# Patient Record
Sex: Female | Born: 1945
Health system: Southern US, Community
[De-identification: ages and names within clinical notes are randomized; demographics above are authoritative.]

## PROBLEM LIST (undated history)

## (undated) DIAGNOSIS — I10 Essential (primary) hypertension: Secondary | ICD-10-CM

## (undated) DIAGNOSIS — E785 Hyperlipidemia, unspecified: Secondary | ICD-10-CM

## (undated) HISTORY — DX: Essential (primary) hypertension: I10

## (undated) HISTORY — PX: REPLACEMENT TOTAL KNEE: SUR1224

## (undated) HISTORY — DX: Hyperlipidemia, unspecified: E78.5

---

## 2003-05-12 ENCOUNTER — Encounter: Admission: RE | Admit: 2003-05-12 | Discharge: 2003-05-12 | Payer: Self-pay | Admitting: Internal Medicine

## 2003-11-15 ENCOUNTER — Ambulatory Visit (HOSPITAL_COMMUNITY): Admission: RE | Admit: 2003-11-15 | Discharge: 2003-11-15 | Payer: Self-pay | Admitting: Obstetrics & Gynecology

## 2004-06-27 ENCOUNTER — Ambulatory Visit (HOSPITAL_COMMUNITY): Admission: RE | Admit: 2004-06-27 | Discharge: 2004-06-27 | Payer: Self-pay | Admitting: Obstetrics & Gynecology

## 2004-10-13 ENCOUNTER — Encounter: Admission: RE | Admit: 2004-10-13 | Discharge: 2004-10-13 | Payer: Self-pay | Admitting: Internal Medicine

## 2005-03-18 ENCOUNTER — Emergency Department (HOSPITAL_COMMUNITY): Admission: EM | Admit: 2005-03-18 | Discharge: 2005-03-18 | Payer: Self-pay | Admitting: Emergency Medicine

## 2005-11-02 ENCOUNTER — Encounter: Admission: RE | Admit: 2005-11-02 | Discharge: 2005-11-02 | Payer: Self-pay | Admitting: Internal Medicine

## 2005-11-23 ENCOUNTER — Ambulatory Visit (HOSPITAL_COMMUNITY): Admission: RE | Admit: 2005-11-23 | Discharge: 2005-11-23 | Payer: Self-pay | Admitting: Obstetrics & Gynecology

## 2005-12-11 ENCOUNTER — Ambulatory Visit (HOSPITAL_COMMUNITY): Admission: RE | Admit: 2005-12-11 | Discharge: 2005-12-11 | Payer: Self-pay | Admitting: Obstetrics & Gynecology

## 2006-01-18 ENCOUNTER — Ambulatory Visit (HOSPITAL_COMMUNITY): Admission: RE | Admit: 2006-01-18 | Discharge: 2006-01-18 | Payer: Self-pay | Admitting: Obstetrics & Gynecology

## 2006-01-18 ENCOUNTER — Encounter (INDEPENDENT_AMBULATORY_CARE_PROVIDER_SITE_OTHER): Payer: Self-pay | Admitting: *Deleted

## 2006-08-13 ENCOUNTER — Ambulatory Visit (HOSPITAL_COMMUNITY): Admission: RE | Admit: 2006-08-13 | Discharge: 2006-08-13 | Payer: Self-pay | Admitting: Internal Medicine

## 2006-10-22 ENCOUNTER — Ambulatory Visit (HOSPITAL_COMMUNITY): Admission: RE | Admit: 2006-10-22 | Discharge: 2006-10-22 | Payer: Self-pay | Admitting: Internal Medicine

## 2006-10-29 ENCOUNTER — Inpatient Hospital Stay (HOSPITAL_COMMUNITY): Admission: RE | Admit: 2006-10-29 | Discharge: 2006-11-01 | Payer: Self-pay | Admitting: Orthopedic Surgery

## 2006-12-04 ENCOUNTER — Encounter: Admission: RE | Admit: 2006-12-04 | Discharge: 2007-01-17 | Payer: Self-pay | Admitting: Orthopedic Surgery

## 2007-10-24 ENCOUNTER — Ambulatory Visit (HOSPITAL_COMMUNITY): Admission: RE | Admit: 2007-10-24 | Discharge: 2007-10-24 | Payer: Self-pay | Admitting: Internal Medicine

## 2007-11-18 ENCOUNTER — Inpatient Hospital Stay (HOSPITAL_COMMUNITY): Admission: RE | Admit: 2007-11-18 | Discharge: 2007-11-20 | Payer: Self-pay | Admitting: Orthopedic Surgery

## 2008-10-25 ENCOUNTER — Ambulatory Visit (HOSPITAL_COMMUNITY): Admission: RE | Admit: 2008-10-25 | Discharge: 2008-10-25 | Payer: Self-pay | Admitting: Internal Medicine

## 2008-11-09 ENCOUNTER — Encounter: Admission: RE | Admit: 2008-11-09 | Discharge: 2008-12-13 | Payer: Self-pay | Admitting: Orthopedic Surgery

## 2009-01-18 IMAGING — CR DG CHEST 2V
2 series · 2 of 2 positions shown · non-contrast
Comparison: 10/24/2006

CLINICAL DATA: Knee osteoarthritis.  Preop evaluation for knee
replacement.

CHEST - 2 VIEW

[w chest pa]
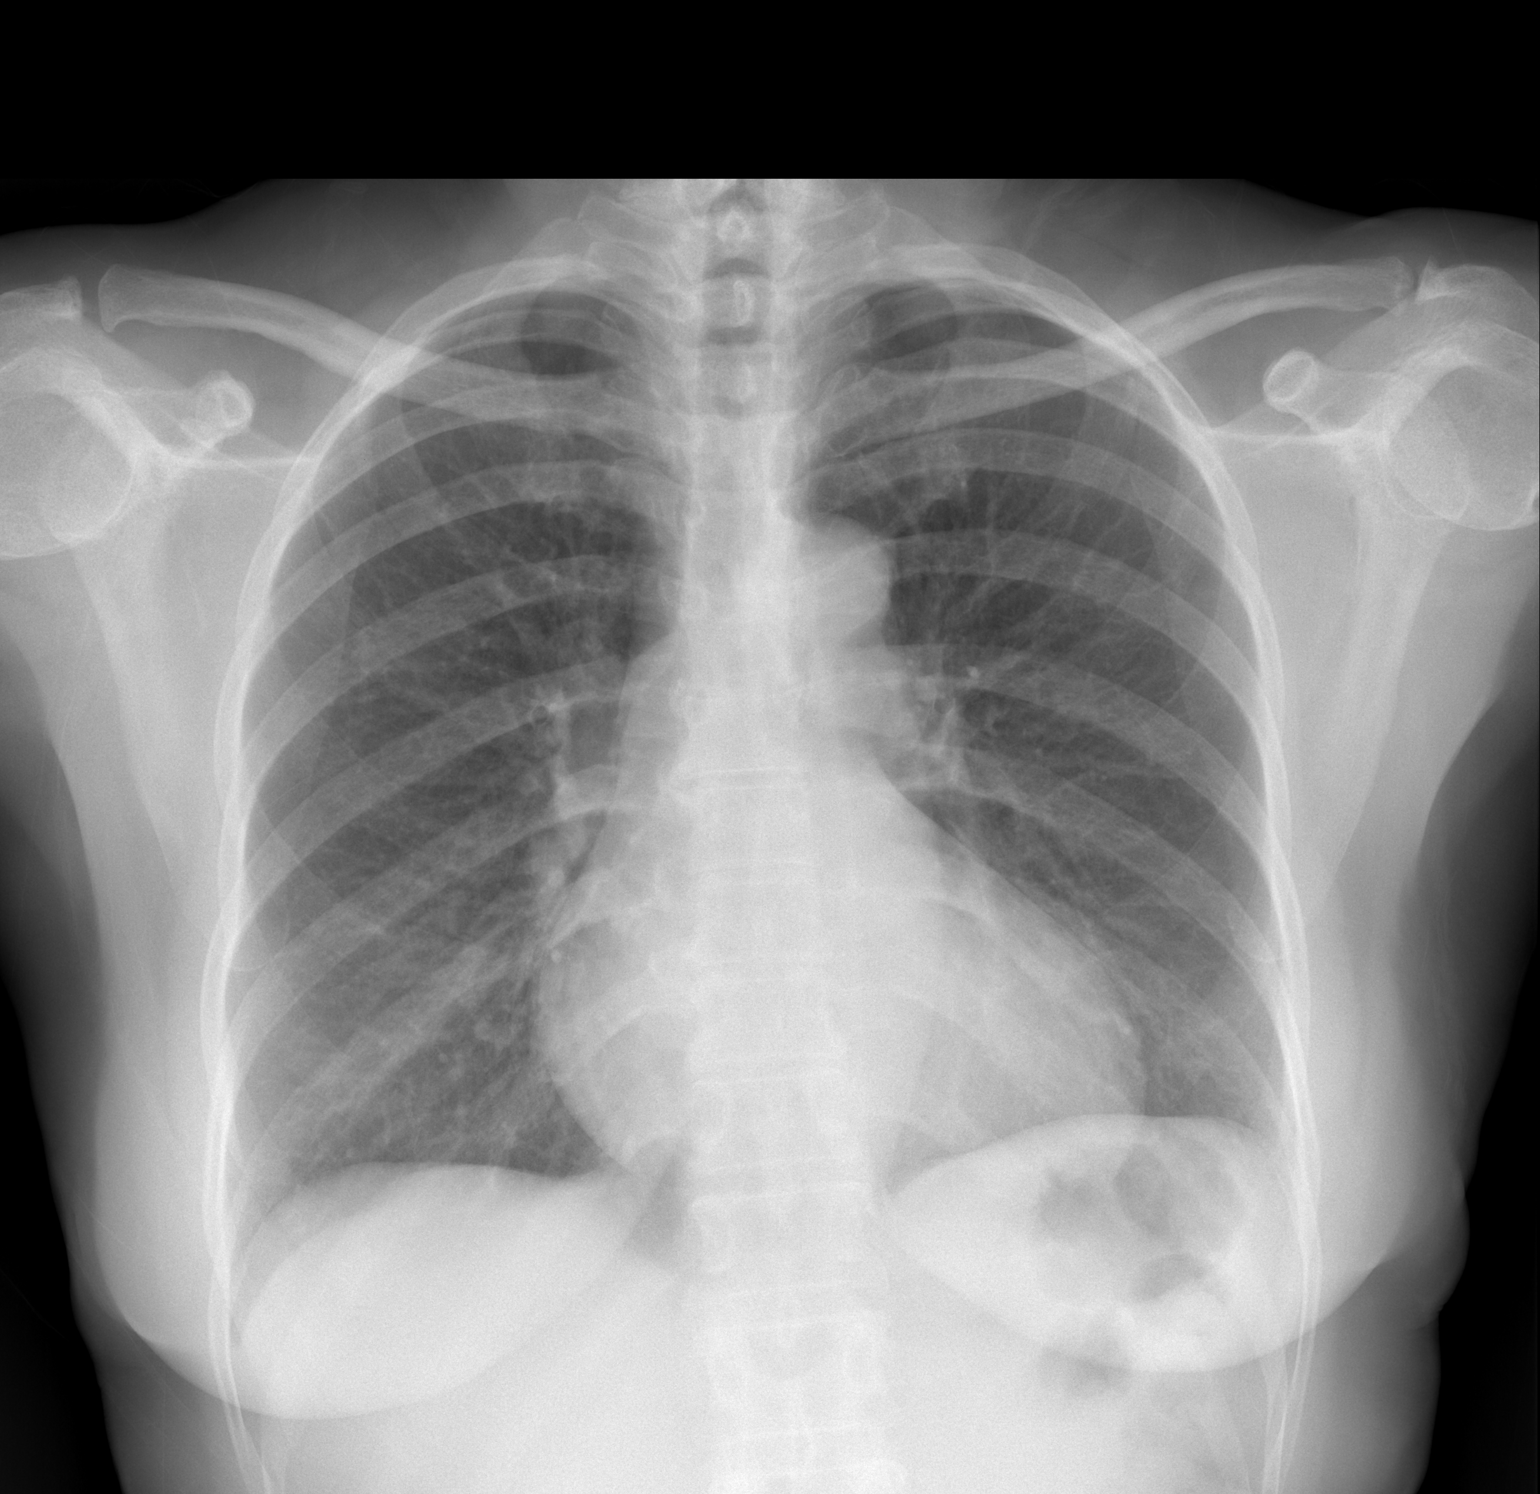

[w chest lat]
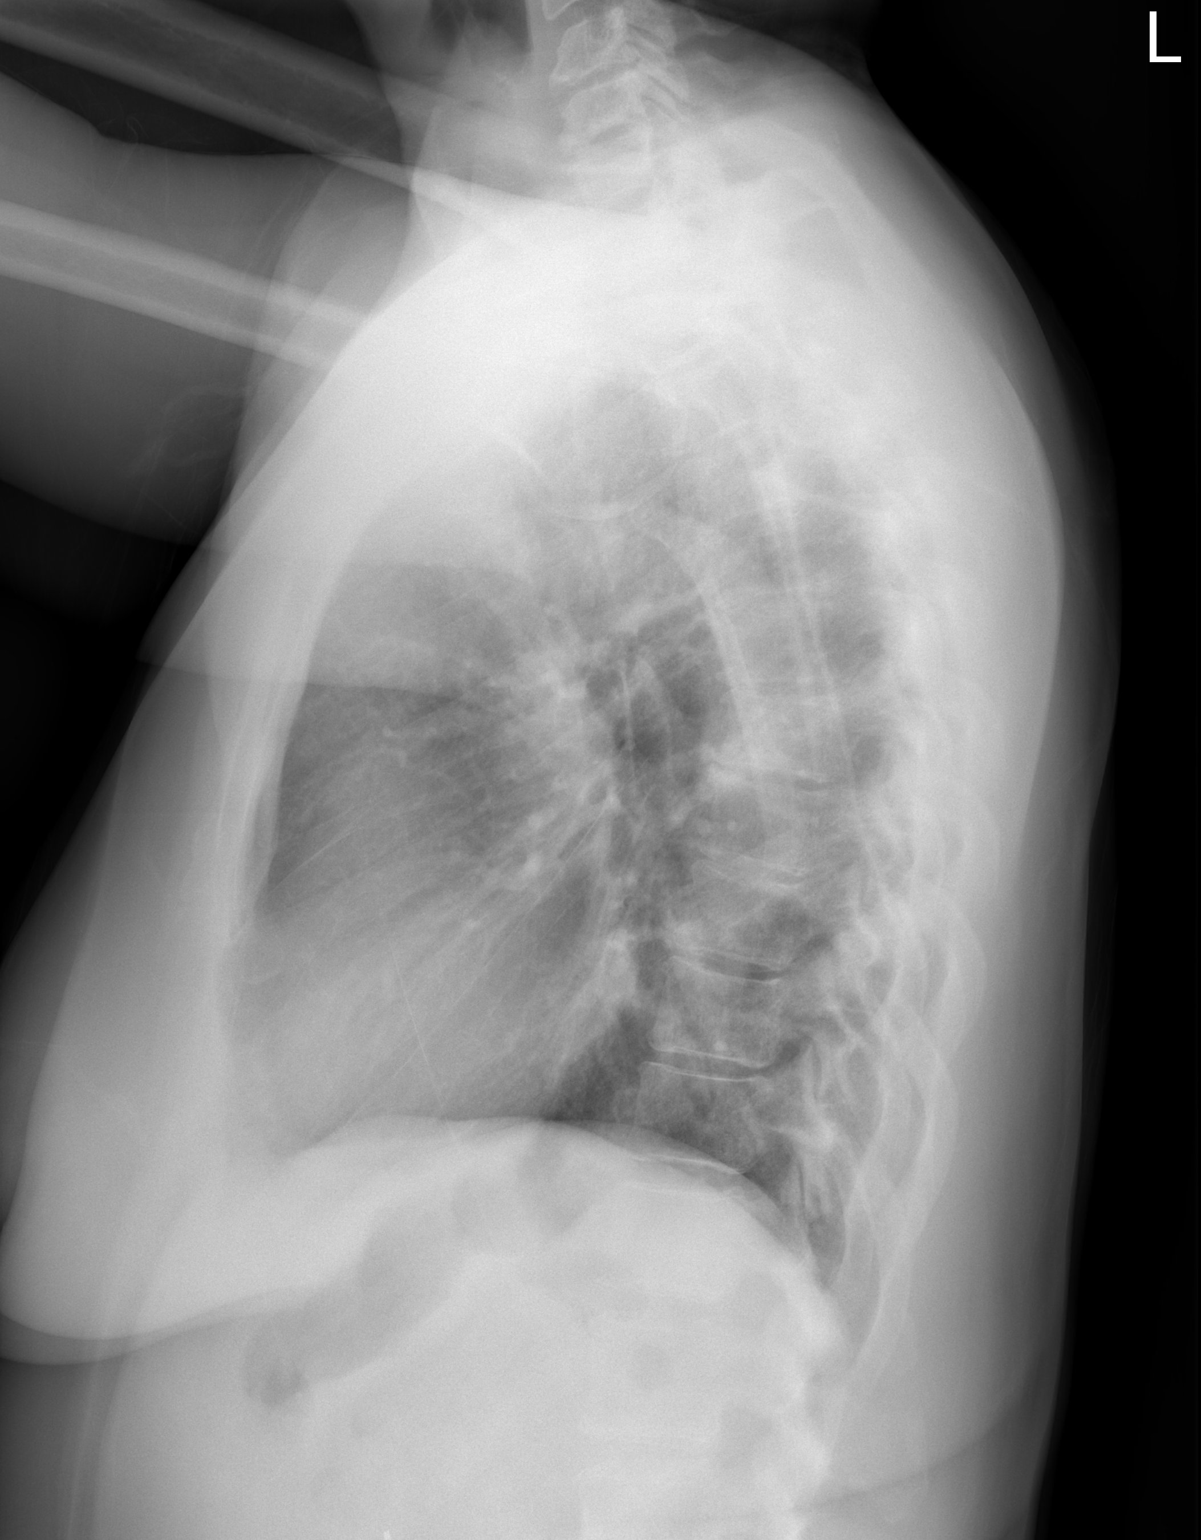

[2 of 2 positions shown; findings below may reference images not displayed]

FINDINGS: Mild cardiomegaly stable.  Both lungs are clear.  There
is no evidence of pleural effusion.  No mass or adenopathy
identified.
IMPRESSION: Stable mild cardiomegaly.  No active disease.

## 2009-07-11 DIAGNOSIS — I10 Essential (primary) hypertension: Secondary | ICD-10-CM | POA: Insufficient documentation

## 2009-07-11 HISTORY — DX: Essential (primary) hypertension: I10

## 2009-07-12 ENCOUNTER — Ambulatory Visit: Payer: Self-pay

## 2009-07-12 ENCOUNTER — Ambulatory Visit: Payer: Self-pay | Admitting: Cardiology

## 2009-11-22 ENCOUNTER — Ambulatory Visit (HOSPITAL_COMMUNITY): Admission: RE | Admit: 2009-11-22 | Discharge: 2009-11-22 | Payer: Self-pay | Admitting: Internal Medicine

## 2010-10-03 NOTE — H&P (Signed)
NAMEMarland [name]  BREALYNN, CONTINO NO.:  [id number]   MEDICAL RECORD NO.:  [id number]          PATIENT TYPE:   LOCATION:                                 FACILITY:   PHYSICIAN:  Marlowe Kays, M.D.  DATE OF BIRTH:  [birth year]   DATE OF ADMISSION:  10/29/2006  DATE OF DISCHARGE:                              HISTORY & PHYSICAL   CHIEF COMPLAINT:  Pain in my left knee.   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  She has documented osteoarthritis  by x-ray as well as examination.  We have tried conservative care  including corticosteroid injections into the left knee, physical therapy  and viscosupplementation.  Unfortunately, she has not improved much that  much where she can continue with her day-to-day activities and is quite  frustrated, now developing a limp.  After much discussion including the  risks and benefits of surgery, it was felt she would benefit from  surgical intervention and is admitted for total knee replacement  arthroplasty to the left knee.   PAST MEDICAL HISTORY:  This lady has been in relatively good health  throughout her lifetime.  Her only surgeries have been what she  describes as a cholelithotomy and a tubal ligation.  She also has  dyslipidemia and hypertension.   Dr. Concepcion Elk is her medical doctor and has cleared her for surgery.   CURRENT MEDICATIONS:  1. Zocor 20 mg one daily.  2. Nifedipine 60 mg one daily.  3. Calcium two tabs daily.  4. Flex-A-Min triple strength two daily.  5. Glucosamine chondroitin.   ALLERGIES:  She has no medical allergies.   ACTIVITY:  Positive for stomach cancer in her mother and coronary artery  disease in her father.   SOCIAL HISTORY:  She is single.  She is a Geophysicist/field seismologist.  She has  no intake of alcohol or tobacco products.  She has 3 children, and her  caregiver  after surgery will be her children.  She lives in a two-story  house with 13 stairs on the staircase.   REVIEW OF SYSTEMS:  CNS:  No seizures, shoulder paralysis, numbness or  double vision.  RESPIRATORY:  No productive cough, no hemoptysis, no  shortness of breath.  CARDIOVASCULAR:  No chest pain, no angina, no  orthopnea.  GASTROINTESTINAL:  No nausea, melena or bloody stools.  GENITOURINARY:  No discharge, dysuria, hematuria.  MUSCULOSKELETAL:  Primarily in present illness with her left knee.   PHYSICAL EXAMINATION:  GENERAL:  Alert, cooperative, friendly, slightly  anxious 65 year old black female who is oriented x3.  VITAL SIGNS:  Blood pressure 128/78 seated right arm, pulse 88 and  regular, respirations 12, unlabored.  HEENT:  Normocephalic.  Pupils are equal, round and reactive to light  and accommodation.  Oropharynx is clear.  CHEST:  Clear to auscultation.  No rhonchi, no rales.  HEART:  Regular rate and rhythm.  There is a grade 3/6 holosystolic  murmur heard best at the right sternal  border.  ABDOMEN:  Soft, nontender.  Liver and spleen not felt.  GENITALIA/RECTAL/PELVIC/BREASTS:  Not done.  Not pertinent to present  illness.  EXTREMITIES:  Left knee is with a crepitus on range of motion.  There is  a moderate amount of boggy synovitis and moderate swelling to the left  knee.   ADMITTING DIAGNOSIS:  1. End-stage osteoarthritis of the left knee.  2. Hypertension.  3. Dyslipidemia.   PLAN:  The patient will undergo left total knee replacement  arthroplasty.  She plans to have her children, specifically her  daughter, to care for her after surgery in her home with home health.  Again, today I described all the risks and benefits of surgery with this  patient including the possibility of infection, loosening, the need for  transfusion postoperatively.      [name].    ______________________________  Marlowe Kays, M.D.    DLU/MEDQ  D:   10/17/2006  T:  10/17/2006  Job:  [id number]   cc:   Fleet Contras, M.D.  Fax: [phone]

## 2010-10-03 NOTE — Discharge Summary (Signed)
NAMEMarland Kitchen  [name], [name] NO.:  [id number]   MEDICAL RECORD NO.:  [id number]          PATIENT TYPE:  INP   LOCATION:  1603                         FACILITY:  [facility]   PHYSICIAN:  Marlowe Kays, M.D.  DATE OF BIRTH:  12-11-1945   DATE OF ADMISSION:  11/18/2007  DATE OF DISCHARGE:  11/20/2007                               DISCHARGE SUMMARY   ADMITTING DIAGNOSES:  1. Adhesions, left total knee.  2. Hypertension.  3. Hyperlipidemia.  4. Hypokalemia.   DISCHARGE DIAGNOSIS:  1. Adhesions, left total knee.  2. Hypertension.  3. Hyperlipidemia.  4. Hypokalemia.   OPERATION:  On November 18, 2007 the patient underwent an arthrotomy with  excision of adhesions and scar tissue of left total knee.  Also  underwent freeing up  patellar mechanism, with a complete lateral  patellar release.  [name] assisted.   BRIEF HISTORY:  This 65 year old black female underwent total knee  replacement arthroplasty in June of last year.  She did fairly well with  surgery, but had less and less range of motion of the knee.  She had CPM  in the hospital on therapy, and then outpatient physical therapy, but  unfortunately she was essentially limited to 50 degrees of flexion.  This was quite frustrating to her, because of her level of activity that  she enjoyed prior to total knee.  After much discussion including the  risks, benefits of surgery, it was decided she would benefit from  surgical intervention and was scheduled for the above procedure.   Preoperatively she was noted to have hypokalemia on preoperative  laboratory findings.   LABORATORY FINDINGS:  Dr. Concepcion Elk gave her potassium b.i.d. for about 5  days, and this brought it up to very acceptable for her surgical  procedure.   COURSE IN HOSPITAL:  The patient tolerated the surgical procedure quite  well, maintaining a very positive outlook concerning range of motion.  She worked diligently with physical therapy.  We were  able to achieve  100-110 degrees of flexion in the operating room and she was told this,  and this would be her goal postoperatively.  On the day of discharge,  she was comfortable, had been ambulating in the hall, tolerating the CPM  machine, as well as range of motion quite well.  P.O. meds were  controlling her discomfort.  She did have some hypotensive episodes.  She has been on nifedipine from Dr. Concepcion Elk of her hypertension.  It is  felt this combined with her minimum blood loss (hemoglobin of 10.7),  might have caused  hypertensive episodes.  This could also be from the  medications.  We therefore stopped her nifedipine, so that she would not  have any syncopal episodes at home, and perhaps fall.  She is instructed  to follow-up with Dr. Concepcion Elk concerning that.   LABORATORY VALUES:  In the hospital showed a final hemoglobin of 10.7,  hematocrit was 31.7.  White count was 7.5.  She did drop her potassium  to 3.3 and we started her on some p.o. potassium while in the hospital  and final her chemistry  showed a sodium of 140, potassium 3.8.  Electrocardiogram showed normal sinus rhythm.  Chest x-Ray was  essentially negative.   CONDITION ON DISCHARGE:  Improved, stable.   PLAN:  The patient is discharged to home in the care of her family,  weightbear as tolerated.  Continue with physical therapy.  We need to  have her try to maintain the flexion that we had achieved while in her  the operating room.  She is given Vicodin for discomfort and Robaxin as  a muscle relaxant.  Again, I told to follow up with Dr. Concepcion Elk, as a  matter-of-fact, to call him today, telling him about the hypotensive  episodes, that we had stopped her blood pressure medication.  We will  not continue her on the potassium either.   The patient was placed on Lovenox postoperatively.  This was stopped.  The last dose is given on the day of discharge, and she will continue  with her one 81 mg aspirin at home.   She will return to see [name] 2 weeks  after the date of surgery and she is urged to call if she has any  problems or questions.  Neurovascular is intact in the operative  extremity at time of discharge.  The patient is very cheerful and  anxious to go home.  All questions were encouraged and answered at  bedside, prior to her discharge.      [name].    ______________________________  Marlowe Kays, M.D.    DLU/MEDQ  D:  11/20/2007  T:  11/20/2007  Job:  [id number]   cc:   Fleet Contras, M.D.  Fax: [phone]

## 2010-10-03 NOTE — H&P (Signed)
NAME:  [name], [name] NO.:  [id number]   MEDICAL RECORD NO.:  [id number]          PATIENT TYPE:  INP   LOCATION:  NA                           FACILITY:  [facility]   PHYSICIAN:  [name], M.D.  DATE OF BIRTH:  April 01, 1946   DATE OF ADMISSION:  10/18/2007  DATE OF DISCHARGE:                              HISTORY & PHYSICAL   CHIEF COMPLAINT:  Problems with my left knee.   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  She did  fairly well after surgery but began having less and less range of motion  of her left knee.  She had CPM  in the hospital, home therapy after her  surgery and then outpatient physical therapy.  She increases her range  of motion somewhat after therapy but it returns to about 50 degrees  flexion.  She has nearly to full extension.  She is quite frustrated  with this.  The knee  is not allowing her to enjoy her day-to-day  activities due to the lack of flexion.  After much discussion including  the risks and benefits of surgery decided to go ahead with left knee  arthroscopy to hopefully be able to lyse all the  adhesions and provide  her with more flexion but we may have to go to downsizing the poly joint  spacer in the knee.  All this was explained to her today and all  questions were encouraged and answered.   PAST MEDICAL HISTORY:  [name] has been in relatively good health under the care of [name].   CURRENT MEDICATIONS:  1. Nifedipine 60 mg one a day.  2. Simvastatin 40 mg one a day.  3. One-A-Day vitamin.  4. Glucosamine chondroitin 2 daily.  5. One baby aspirin daily.  Will stop prior to surgery.  6. [place] ginseng  daily which she will also stop.   It was noted her potassium was 2.9 in her preop labs and [name] [name]  her on potassium 20 mEq one b.i.d.  Has a history of hypertension,  hyperlipidemia.   FAMILY HISTORY:  Positive for congestive heart failure in the father and cancer in the  mother.   SOCIAL HISTORY:  She is a single Geologist, engineering.  Her family will take care of her  after the surgery with home health if needed.   ALLERGIES:  No known drug allergies.   REVIEW OF SYSTEMS:  CNS:  No seizures or paralysis, numbness, double vision.  RESPIRATORY:  No productive cough, no hemoptysis, no shortness of breath.  CARDIOVASCULAR:  No chest pain, no angina, no orthopnea.  GASTROINTESTINAL:  No nausea, vomiting, melena or bloody stools.  GENITOURINARY:  No discharge, dysuria, hematuria.  MUSCULOSKELETAL:  Primarily in present illness with her left knee.   PHYSICAL EXAMINATION:  Alert and cooperative, friendly, 5 feet 4, 166 pound black female whose  vital signs are blood pressure 120/72 seated left arm, respirations 12,  pulse 76.  HEENT:  Normocephalic PERRLA.  Oropharynx is clear.  CHEST:  Clear to auscultation,  no rhonchi, no rales.  HEART:  Regular rate and rhythm.  No murmurs are heard.  ABDOMEN:  Soft, nontender, liver, spleen not felt.  GENITALIA, RECTAL, PELVIC, BREASTS:  Not done, not pertinent to  presenting illness.  EXTREMITIES:  The patient has a well-healed anterior scar over the left  knee.  She has near zero on extension and can flex to only about 50-55  degrees of the knee.  Any flexion beyond that is essentially stopped and  is painful if forced.   ADMISSION DIAGNOSES:  1. Adhesions left total knee.  2. Hypertension.  3. Hyperlipidemia.  4. Hypokalemia.   PLAN:  The patient will undergo left knee arthroscopy with lysis of  adhesions and possible change of the left knee spacer in the total knee.      [name].    ______________________________  [name], M.D.    DLU/MEDQ  D:  11/13/2007  T:  11/13/2007  Job:  [id number]   cc:   [name], M.D.  Fax: [phone]

## 2010-10-03 NOTE — Op Note (Signed)
NAMEMarland Kitchen  [name], [name] NO.:  [id number]   MEDICAL RECORD NO.:  [id number]          PATIENT TYPE:  INP   LOCATION:  1611                         FACILITY:  [facility]   PHYSICIAN:  Marlowe Kays, M.D.  DATE OF BIRTH:  1945-07-03   DATE OF PROCEDURE:  10/29/2006  DATE OF DISCHARGE:                               OPERATIVE REPORT   PREOPERATIVE DIAGNOSIS:  Osteoarthritis left knee.   POSTOPERATIVE DIAGNOSIS:  Osteoarthritis left knee.   OPERATION:  Osteonics total knee replacement left.   SURGEON:  Marlowe Kays, M.D.   ASSISTANTDruscilla Brownie. Underwood, P.A.-C.   ANESTHESIA:  General.   PATHOLOGY AND INDICATIONS OF PROCEDURE:  She has tricompartmental  arthritis with slight medial compartment narrowing.  She has not  responded to Visco supplementation, and accordingly is here today for  the above-mentioned surgery.   PROCEDURE:  Prophylactic antibiotics, satisfactory spinal anesthesia.  Time-out performed. Pneumatic tourniquet.  Surefoot and lateral hip  stabilizer to left lower extremity.  DuraPrep from tourniquet to ankle,  with a leg drape and sterile field, Ioban employed.  Vertical midline  incision down to the patellar mechanism.  Median parapatellar incision  to open the joint.  Osteophytes from around the patella and femur were  removed.  A very inflamed suprapatellar bursa was removed.  The patellar  mechanism was freed up, patella everted, and the knee flexed.  ACL was  sacrificed as well as remnants of menisci, and the pes anserinus was  dissected off the medial tibia.  First made a 5/16-inch drill hole in  the distal femur, followed by the axis aligner and distal femoral  cutting jig set for a 10 mm cut with the liner at the 5-degree valgus.  Distal femoral cut was made, and we then used the jig for sizing, and  she measured out at a 7.  Distal femoral cutting jig was placed, and  anterior posterior cuts, posterior and anterior chamferings were  made.  Then, I went to the tibia, where I made a leveling cut and then sized  the tibia at a #7.  I made my initial intramedullary drill hole,  followed by step-cut drill, canal finder, and then placed intramedullary  rod set for 4 mm cut off the depressed medial tibial plateau at 0  degrees.  The proximal tibial cut was made, and I returned to the femur,  where the jig for creating the patellar groove and the notchplasty was  placed, and the groove and the notchplasty were performed.  Then, I went  through a trial reduction and found that at least a 10 spacer fit  easily, and perhaps a 12 would be needed.  The knee was then placed in  extension, and the recessed patellar jig was placed after sizing the  potential patellar side at 26 mm.  A 10 mm recessed cut was made,  followed by the guide for the three fixation holes.  I then placed a  trial patellar button, trimming away excess bone from around the  perimeter of the button.  Returning to the tibia, with the components in  place, I used  an external rod, splitting the bimalleolar distance at the  ankle to marked the scribe lines for proper tibial plate placement.  The  tibial tray was then placed and stabilized, and using the tripod  apparatus we reamed up to a 7 cemented.  We then Water-Piked the knee  while the components were opened. and methylmethacrylate mixed.  We then  individually glued in the three components, starting first with the  tibia, impacting it, and exclude in the femur, impacting it and holding  the knee in full extension with a 10 mm spacer while we glued in the  patella, holding it with a patellar clamp.  When the methacrylate  hardened, I trimmed up bits of methylmethacrylate from around the three  components, and we went through another trial reduction and found that a  12 mm spacer was a better fit than the 10, bringing the knee into full  extension, with good medial and lateral stability.  Accordingly, we then   made sure was no remaining methyl methacrylate, irrigated the wound  well, and placed the final 12 mm insert, posterior stabilized, and again  checked the knee for range of motion stability.  The lateral release was  performed, and the wound closed over a Hemovac with interrupted #1  Vicryl in two layers in the quadriceps tendon and distally in the  synovium and capsule, a  combination of #1 and  2-0 Vicryl in the  subcutaneous tissue, and staples in the skin.  Betadine and Adaptic dry  sterile dressing were applied.  Tourniquet was released with 1 hour 52  minutes of tourniquet time having elapsed.  Knee immobilizer was  applied.  She tolerated the procedure well and was taken to the recovery  room in satisfactory condition, with no known complications.           ______________________________  Marlowe Kays, M.D.     JA/MEDQ  D:  10/29/2006  T:  10/30/2006  Job:  [id number]

## 2010-10-03 NOTE — Discharge Summary (Signed)
NAMEMONTANA, [name] NO.:  [id number]   MEDICAL RECORD NO.:  [id number]          PATIENT TYPE:  INP   LOCATION:  1611                         FACILITY:  [facility]   PHYSICIAN:  Marlowe Kays, M.D.  DATE OF BIRTH:  Nov 25, 1945   DATE OF ADMISSION:  10/29/2006  DATE OF DISCHARGE:  11/01/2006                               DISCHARGE SUMMARY   ADMITTING DIAGNOSES:  1. Severe osteoarthritis of the left knee.  2. Hypertension.  3. Dyslipidemia.   DISCHARGE DIAGNOSES:  1. Severe osteoarthritis of the left knee.  2. Hypertension.  3. Dyslipidemia.  4. Mild postoperative hemorrhagic anemia.   OPERATION:  On October 29, 2006, the patient underwent Osteonics total knee  replacement arthroplasty of the left knee with DL Idolina Primer assisting.   BRIEF HISTORY:  This 65 year old lady seen by [name] for continuing problems  concerning pain into her left knee.  She has bilateral osteoarthritis,  but it is more intense on the left knee than is the right.  She has had  more and more difficulty getting about, requiring more and more  analgesics.  X-rays have documented her tri-compartmental arthritis.  After much discussion including the risks and benefits of surgery, it  was felt she would benefit with surgical interventions, admitted for the  above procedure.   COURSE IN THE HOSPITAL:  The patient tolerated the surgical procedure  quite well.  The Hemovac and dressing change was performed on the second  post-op day.  The patient had a considerable amount of thigh and groin  pain.  This was thought to be secondary to the tourniquet affect.  His  CPM was not used while she was in the hospital.  We relied on physical  therapy to begin her slow range of motion, full extension to 99.0  degrees.  She, of course, did not achieve this during hospitalization,  but hopefully she will at her home program.   She did have a drop in the hemoglobin postoperatively.  Preoperative  hemoglobin was  13.1 with hematocrit of 39.2.  Her final hemoglobin was  9.1 with hematocrit of 27.0.  We did start her on iron supplements.   She had some fracture blisters, her left knee, secondary to retraction  needed during surgery, as well as some of her edema.  She also had some  mild serosanguineous drainage to the anterior knee.   On the day of discharge, her daughter was in the room.  They both felt  that she could be discharged.  They could manage her at home with her  family support.  We discussed at length all the precautions of the knee,  which essentially were very few, only to wear the knee immobilizer when  up and about and continue with ice.  All questions were encouraged and  answered at the bedside at the time of discharge.   LABORATORY VALUES:  In the hospital:  Hematological showed admission CBC  essentially within normal limit.  Final hemoglobin was as mentioned  above.  Blood chemistries remained essentially normal.  She has a very  mild hypokalemia postoperatively.  This was not treated.  A chest x-ray  showed mild cardiomegaly with no active disease.  Electrocardiogram  showed normal sinus rhythm with possible left atrial enlargement.   CONDITION ON DISCHARGE:  Improved, stable.   PLAN:  The patient discharged home.  She is to continue on the Coumadin  protocol which was started immediately postoperatively, supplemented  with Lovenox.  She will not use any Lovenox at home.   MEDICATIONS AT DISCHARGE:  From our practice, her Percocet for pain,  Robaxin 500 mg for muscle spasm, Trinsicon one b.i.d. x1 month and  Coumadin per pharmacy protocol.  Also, want her to have a stool softener  daily.  She is to continue with her home medications which include  nifedipine XL 60 mg daily, Zocor 20 mg nightly, Caltrate plus D,  Fleximen Multivite.  She was on an aspirin 81 mg daily.  She will not  continue with that and will resume her aspirin, after her Coumadin  protocol is completely  finished.      [name].    ______________________________  Marlowe Kays, M.D.    DLU/MEDQ  D:  11/01/2006  T:  11/02/2006  Job:  [id number]   cc:   Marlowe Kays, M.D.  Fax: [phone]   Fleet Contras, MD  Fax: [phone]

## 2010-10-03 NOTE — Op Note (Signed)
NAME:  [name], [name] NO.:  [id number]   MEDICAL RECORD NO.:  [id number]          PATIENT TYPE:  INP   LOCATION:  0001                         FACILITY:  [facility]   PHYSICIAN:  Marlowe Kays, M.D.  DATE OF BIRTH:  Oct 02, 1945   DATE OF PROCEDURE:  11/18/2007  DATE OF DISCHARGE:                               OPERATIVE REPORT   PREOPERATIVE DIAGNOSIS:  Stiff left knee status post total knee  replacement.   POSTOPERATIVE DIAGNOSIS:  Stiff left knee status post total knee  replacement.   OPERATION:  Arthrotomy,left knee with massive lysis of adhesions and  freeing up of the patellar mechanism with complete lateral patellar  release.   SURGEON:  Marlowe Kays, M.D.   ASSISTANTDruscilla Brownie. [name], P.A.-C.   ANESTHESIA:  General.   PATHOLOGY/INDICATION FOR PROCEDURE:  Original total knee replacement was  on October 29, 2006.  Despite aggressive postoperative care and physical  therapy, she has persisted in having a loss of flexion.  The components  all have looked good.  She has finally come to the decision to let us go  ahead and performed the above-mentioned surgery.   PROCEDURE IN DETAIL:  Prophylactic antibiotics, satisfactory general  anesthesia, preoperative motion assessment was that her knee came into  full extension and flexed to 45 degrees.  Pneumatic tourniquet applied,  lateral hip positioner and Surefoot.   The left leg was prepped with DuraPrep from tourniquet to ankle, draped  in sterile field, Ioban employed.  The leg was esmarched out sterilely.  I excised the old surgical scar, which I marked before applying the  [place].  I then, working centrally and medially and laterally, went down  to the patellar mechanism and started out with a median parapatellar  incision to open the joint.  I then began a step-wise freeing up of  adhesions, freeing up the patellar mechanism medially, working around  the quadriceps tendon and patellar tendon, the pes  anserinus and the mid  collateral ligament off the medial tibia.  It became clear that her  motion was still significantly restricted, so I ended up freeing up the  patellar mechanism laterally.  At this point, I did not perform a  lateral release.  I kept working, removing additional fibrous tissue  which was extensive.  Eventually came to the conclusion that I would  have to perform a complete lateral release, in order to free up the  patellar mechanism enough to allow the desired amount of flexion and  also because when we flexed the knee her patella persistently subluxed  laterally.  Accordingly, after performing complete patellar release, I  was able to evert the patella and debulked the parapatellar soft tissue,  which was very fibrotic.  Ultimately I was able to flex the knee past 9  degrees with the tourniquet inflated, and I felt that we had performed  as much of an extensor release as I was able.  I did not want to Z-  lengthen the quadriceps tendon unless absolutely necessary, since we  wished to begin early motion on his woman.  I released the tourniquet,  bleeders were coagulated.  I then closed the wound in layers with  interrupted #1 Vicryl in the full-thickness tissues medially, and I used  some loose, tagging sutures at the lateral release to make sure that the  lateral-most tissue did not fall back on itself.  The subcutaneous  tissue was closed with a combination of 0 and 2-0 Vicryl staples in the  skin, Betadine and Adaptic pressure dressing were applied, following the  removal of the drapes.  We then checked motion again, and flexion was  100 to 110 degrees.  She tolerated the procedure well and was taken to  recovery in satisfactory condition, with no known complications and  minimal blood loss.           ______________________________  Marlowe Kays, M.D.     JA/MEDQ  D:  11/18/2007  T:  11/18/2007  Job:  [id number]

## 2010-10-06 NOTE — H&P (Signed)
NAME:  [name], [name] NO.:  [id number]   MEDICAL RECORD NO.:  [id number]          PATIENT TYPE:  AMB   LOCATION:  SDC                           FACILITY:  [facility]   PHYSICIAN:  Roseanna Rainbow, M.D.DATE OF BIRTH:  1946/04/21   DATE OF ADMISSION:  DATE OF DISCHARGE:                                HISTORY & PHYSICAL   CHIEF COMPLAINT:  The patient is a 65 year old African-American female with  a recently diagnosed likely endometrial polyp for D&C and hysteroscopy,  possible resectoscope for resection of the endometrial polyp.   HISTORY OF PRESENT ILLNESS:  The patient had initially presented complaining  of left lower quadrant abdominal pain approximately one month ago.  She has  a known myomatous uterus.  At this point, a pelvic ultrasound was obtained.  Of note, the endometrial stripe was noted to be cystic.  Subsequent to the  ultrasound, a sonohystogram confirmed an anterior submucous myoma and a  posterior 2.4 cm broad-based polyp at the fundus.   ALLERGIES:  No known drug allergies.   SOCIAL HISTORY:  She is divorced.  She is a Engineer, technical sales.  She has no significant  smoking history.  Does not give any significant history of alcohol use.  She  denies illicit drug use.   FAMILY HISTORY:  Heart disease.   Pap smear on July 2nd negative for intraepithelial lesions or malignancy.   PAST MEDICAL HISTORY:  Chronic hypertension.   PAST SURGICAL HISTORY:  She denies.   MEDICATIONS:  Include Adalat CC oral tablet 60 mg 24 hour, Maxzide, Zocor,  multivitamin, and aspirin.   PHYSICAL EXAMINATION:  VITAL SIGNS:  Temperature 98.8, pulse 69, blood  pressure 142/75.  Weight 175 pounds.  Height 5 feet 2 inches.  GENERAL:  Well-developed and well-nourished in no apparent distress.  LUNGS:  Clear to auscultation bilaterally.  HEART:  Regular rate and rhythm.  ABDOMEN:  Soft, nontender.  No organomegaly.  PELVIC:  The external female genitalia are atrophic-appearing.  On  speculum  exam, the mucosa is clear without lesions.  Bimanual exam:  The uterus is  nontender, normal size, shape, and consistency.  Adnexa:  No masses,  organomegaly, or local guarding.   ASSESSMENT:  Rule out endometrial polyps.   PLAN:  The planned procedure is D&C, hysteroscopy with removal of the polyp,  possible resectoscope.  The risks, benefits and alternative forms of  management reviewed with the patient, and informed consent has been  obtained.      Roseanna Rainbow, M.D.  Electronically Signed     LAJ/MEDQ  D:  01/17/2006  T:  01/17/2006  Job:  [id number]

## 2010-10-06 NOTE — Op Note (Signed)
NAMEMarland Kitchen  RANDYE, TREICHLER NO.:  [id number]   MEDICAL RECORD NO.:  [id number]          PATIENT TYPE:  AMB   LOCATION:  SDC                           FACILITY:  [facility]   PHYSICIAN:  Roseanna Rainbow, M.D.DATE OF BIRTH:  06/09/1945   DATE OF PROCEDURE:  01/18/2006  DATE OF DISCHARGE:                                 OPERATIVE REPORT   PREOPERATIVE DIAGNOSIS:  Endometrial polyps.   POSTOPERATIVE DIAGNOSIS:  Endometrial polyps.   PROCEDURE:  D&C hysteroscopy with removal of polyps.   SURGEON:  Dr. Antionette Char.   ANESTHESIA:  General endotracheal paracervical block.   ESTIMATED BLOOD LOSS:  Minimal.   COMPLICATIONS:  None.   IV FLUIDS:  As per anesthesiology.   PATHOLOGY:  Endometrial curettings and endometrial polyps sent to pathology.   FINDINGS:  Upon hysteroscopic survey of the endometrial cavity, there were  two polypoid lesions emanating from the uterine fundus anteriorly.  The  remainder of the cavity was unremarkable.   DESCRIPTION OF PROCEDURE:  The patient was taken to the operating room with  an IV running.  General anesthesia was induced without difficulty.  The  patient was placed in the dorsal lithotomy position and prepped and draped  in the usual sterile fashion.  A weighted speculum and Sims retractor were  then placed into the vagina.  The anterior lip of the cervix was infiltrated  with 2 mL of 1% lidocaine.  The single-tooth tenaculum was applied to this  location.  4 mL of 1% lidocaine were then injected at 4 and 7 o'clock to  produce a paracervical block.  The cervix was dilated with Shawnie Pons dilators.  Hysteroscopic survey of the endometrial cavity revealed the above findings.  The hysteroscope was then removed.  The polyps were grasped with polyp  forceps and removed.  A repeat hysteroscopy was then performed and there  were no other lesions noted.  The hysteroscope again was removed.  A sharp  curettage was then performed.  The  uterus sounded to 8 cm.  The single-tooth  tenaculum was then removed with minimal  bleeding noted from the cervix.  At the close of the procedure, the  instrument and pack counts were said to be correct x2.  The deficit of  sorbitol used as the distending medium was 80 mL.  The patient was taken to  the PACU awake and in stable condition.      Roseanna Rainbow, M.D.  Electronically Signed     LAJ/MEDQ  D:  01/18/2006  T:  01/19/2006  Job:  [id number]

## 2010-12-05 ENCOUNTER — Other Ambulatory Visit (HOSPITAL_COMMUNITY): Payer: Self-pay | Admitting: Surgery

## 2010-12-05 DIAGNOSIS — Z1231 Encounter for screening mammogram for malignant neoplasm of breast: Secondary | ICD-10-CM

## 2010-12-14 ENCOUNTER — Ambulatory Visit (HOSPITAL_COMMUNITY)
Admission: RE | Admit: 2010-12-14 | Discharge: 2010-12-14 | Disposition: A | Discharge status: Home / Self Care | Payer: BC Managed Care – PPO | Source: Ambulatory Visit | Attending: Surgery | Admitting: Surgery

## 2010-12-14 DIAGNOSIS — Z1231 Encounter for screening mammogram for malignant neoplasm of breast: Secondary | ICD-10-CM

## 2011-02-15 LAB — BASIC METABOLIC PANEL
BUN: 14
BUN: 10
BUN: 12
CO2: 33 — ABNORMAL HIGH
CO2: 29
CO2: 31
Calcium: 9.4
Calcium: 8.6
Calcium: 8.8
Chloride: 104
Chloride: 106
Chloride: 98
Creatinine, Ser: 0.75
Creatinine, Ser: 0.73
Creatinine, Ser: 0.8
GFR calc Af Amer: >60
GFR calc Af Amer: >60
GFR calc Af Amer: >60
GFR calc non Af Amer: >60
GFR calc non Af Amer: >60
GFR calc non Af Amer: >60
Glucose, Bld: 102 — ABNORMAL HIGH
Glucose, Bld: 107 — ABNORMAL HIGH
Glucose, Bld: 116 — ABNORMAL HIGH
Potassium: 2.9 — ABNORMAL LOW
Potassium: 3.3 — ABNORMAL LOW
Potassium: 3.8
Sodium: 144
Sodium: 134 — ABNORMAL LOW
Sodium: 140

## 2011-02-15 LAB — CBC
HCT: 31.7 — ABNORMAL LOW
HCT: 34.3 — ABNORMAL LOW
Hemoglobin: 10.7 — ABNORMAL LOW
Hemoglobin: 11.6 — ABNORMAL LOW
MCHC: 33.6
MCHC: 33.7
MCV: 89.8
MCV: 90.1
Platelets: 155
Platelets: 174
RBC: 3.52 — ABNORMAL LOW
RBC: 3.82 — ABNORMAL LOW
RDW: 14.7
RDW: 15.1
WBC: 7.5
WBC: 9.1

## 2011-02-15 LAB — HEMOGLOBIN AND HEMATOCRIT, BLOOD
HCT: 39.5
Hemoglobin: 13.3

## 2011-02-15 LAB — POTASSIUM: Potassium: 3.6

## 2011-03-08 LAB — APTT: aPTT: 29

## 2011-03-08 LAB — COMPREHENSIVE METABOLIC PANEL
ALT: 24
AST: 25
Albumin: 3.6
Alkaline Phosphatase: 63
BUN: 11
CO2: 34 — ABNORMAL HIGH
Calcium: 9.5
Chloride: 107
Creatinine, Ser: 0.66
GFR calc Af Amer: >60
GFR calc non Af Amer: >60
Glucose, Bld: 104 — ABNORMAL HIGH
Potassium: 3.5
Sodium: 147 — ABNORMAL HIGH
Total Bilirubin: 1.1
Total Protein: 6.9

## 2011-03-08 LAB — CBC
HCT: 27 — ABNORMAL LOW
HCT: 30.3 — ABNORMAL LOW
HCT: 36.2
HCT: 39.2
Hemoglobin: 10.4 — ABNORMAL LOW
Hemoglobin: 12.1
Hemoglobin: 13.1
Hemoglobin: 9.1 — ABNORMAL LOW
MCHC: 33.3
MCHC: 33.4
MCHC: 33.9
MCHC: 34.2
MCV: 89.3
MCV: 89.7
MCV: 90.1
MCV: 90.3
Platelets: 138 — ABNORMAL LOW
Platelets: 145 — ABNORMAL LOW
Platelets: 174
Platelets: 186
RBC: 2.99 — ABNORMAL LOW
RBC: 3.38 — ABNORMAL LOW
RBC: 4.01
RBC: 4.39
RDW: 14
RDW: 14.5 — ABNORMAL HIGH
RDW: 14.5 — ABNORMAL HIGH
RDW: 14.8 — ABNORMAL HIGH
WBC: 13.7 — ABNORMAL HIGH
WBC: 16.3 — ABNORMAL HIGH
WBC: 4.2
WBC: 9.4

## 2011-03-08 LAB — BASIC METABOLIC PANEL
BUN: 6
BUN: 6
CO2: 30
CO2: 31
Calcium: 8.5
Calcium: 8.9
Chloride: 101
Chloride: 104
Creatinine, Ser: 0.72
Creatinine, Ser: 0.73
GFR calc Af Amer: >60
GFR calc Af Amer: >60
GFR calc non Af Amer: >60
GFR calc non Af Amer: >60
Glucose, Bld: 135 — ABNORMAL HIGH
Glucose, Bld: 149 — ABNORMAL HIGH
Potassium: 3.1 — ABNORMAL LOW
Potassium: 3.1 — ABNORMAL LOW
Sodium: 140
Sodium: 142

## 2011-03-08 LAB — DIFFERENTIAL
Basophils Absolute: 0
Basophils Relative: 0
Eosinophils Absolute: 0.1
Eosinophils Relative: 2
Lymphocytes Relative: 48 — ABNORMAL HIGH
Lymphs Abs: 2
Monocytes Absolute: 0.4
Monocytes Relative: 9
Neutro Abs: 1.7
Neutrophils Relative %: 40 — ABNORMAL LOW

## 2011-03-08 LAB — URINALYSIS, ROUTINE W REFLEX MICROSCOPIC
Bilirubin Urine: NEGATIVE
Glucose, UA: NEGATIVE
Hgb urine dipstick: NEGATIVE
Ketones, ur: NEGATIVE
Nitrite: NEGATIVE
Protein, ur: NEGATIVE
Specific Gravity, Urine: 1.017
Urobilinogen, UA: 0.2
pH: 7

## 2011-03-08 LAB — PROTIME-INR
INR: 1
INR: 1.1
INR: 1.5
INR: 1.6 — ABNORMAL HIGH
Prothrombin Time: 12.8
Prothrombin Time: 14.3
Prothrombin Time: 18.9 — ABNORMAL HIGH
Prothrombin Time: 19.6 — ABNORMAL HIGH

## 2011-03-08 LAB — ABO/RH: ABO/RH(D): A POS

## 2011-03-08 LAB — TYPE AND SCREEN
ABO/RH(D): A POS
Antibody Screen: NEGATIVE

## 2011-03-08 LAB — URINE MICROSCOPIC-ADD ON

## 2011-05-04 ENCOUNTER — Ambulatory Visit (INDEPENDENT_AMBULATORY_CARE_PROVIDER_SITE_OTHER): Payer: BC Managed Care – PPO | Admitting: Vascular Surgery

## 2011-05-04 DIAGNOSIS — L539 Erythematous condition, unspecified: Secondary | ICD-10-CM

## 2011-05-04 DIAGNOSIS — M79609 Pain in unspecified limb: Secondary | ICD-10-CM

## 2011-05-17 NOTE — Procedures (Unsigned)
DUPLEX DEEP VENOUS EXAM - LOWER EXTREMITY  INDICATION:  Left lower extremity pain.  HISTORY:  Edema:  Yes. Trauma/Surgery:  Left knee replacement in 2008. Pain:  Yes. PE:  No. Previous DVT:  No. Anticoagulants:  No. Other:  DUPLEX EXAM:               CFV   SFV   PopV  PTV    GSV               R  L  R  L  R  L  R   L  R  L Thrombosis    o  o     o     o      o     o Spontaneous   +  +     +     +      +     + Phasic        +  +     +     +      +     + Augmentation  +  +     +     +      +     + Compressible  +  +     +     +      +     + Competent     +  0     +     +      +     0  Legend:  + - yes  o - no  p - partial  D - decreased  IMPRESSION: 1. No evidence of deep venous thrombosis present involving the left     lower extremity. 2. No evidence of superficial venous thrombosis present, venous reflux     present involving the left great saphenous vein. 3. Anechoic area not vascular in nature present in the anterior     lateral distal calf. 4. Left anterior tibial veins are patent and compressible. 5. Contralateral common femoral vein is patent and compressible.   _____________________________ Larina Earthly, M.D.  SH/MEDQ  D:  05/04/2011  T:  05/04/2011  Job:  [id number]

## 2011-07-09 DIAGNOSIS — R05 Cough: Secondary | ICD-10-CM | POA: Diagnosis not present

## 2011-07-09 DIAGNOSIS — I10 Essential (primary) hypertension: Secondary | ICD-10-CM | POA: Diagnosis not present

## 2011-07-09 DIAGNOSIS — J0100 Acute maxillary sinusitis, unspecified: Secondary | ICD-10-CM | POA: Diagnosis not present

## 2011-07-09 DIAGNOSIS — R059 Cough, unspecified: Secondary | ICD-10-CM | POA: Diagnosis not present

## 2011-07-31 DIAGNOSIS — Q828 Other specified congenital malformations of skin: Secondary | ICD-10-CM | POA: Diagnosis not present

## 2011-09-13 DIAGNOSIS — D1801 Hemangioma of skin and subcutaneous tissue: Secondary | ICD-10-CM | POA: Diagnosis not present

## 2011-09-13 DIAGNOSIS — L259 Unspecified contact dermatitis, unspecified cause: Secondary | ICD-10-CM | POA: Diagnosis not present

## 2011-09-13 DIAGNOSIS — D485 Neoplasm of uncertain behavior of skin: Secondary | ICD-10-CM | POA: Diagnosis not present

## 2011-09-24 DIAGNOSIS — M6740 Ganglion, unspecified site: Secondary | ICD-10-CM | POA: Diagnosis not present

## 2011-10-04 DIAGNOSIS — M159 Polyosteoarthritis, unspecified: Secondary | ICD-10-CM | POA: Diagnosis not present

## 2011-10-04 DIAGNOSIS — M79609 Pain in unspecified limb: Secondary | ICD-10-CM | POA: Diagnosis not present

## 2011-11-01 DIAGNOSIS — M79609 Pain in unspecified limb: Secondary | ICD-10-CM | POA: Diagnosis not present

## 2011-11-23 DIAGNOSIS — I10 Essential (primary) hypertension: Secondary | ICD-10-CM | POA: Diagnosis not present

## 2011-11-23 DIAGNOSIS — N200 Calculus of kidney: Secondary | ICD-10-CM | POA: Diagnosis not present

## 2011-11-23 DIAGNOSIS — Z0000 Encounter for general adult medical examination without abnormal findings: Secondary | ICD-10-CM | POA: Diagnosis not present

## 2011-11-23 DIAGNOSIS — E785 Hyperlipidemia, unspecified: Secondary | ICD-10-CM | POA: Diagnosis not present

## 2011-11-29 DIAGNOSIS — Z1212 Encounter for screening for malignant neoplasm of rectum: Secondary | ICD-10-CM | POA: Diagnosis not present

## 2012-02-18 ENCOUNTER — Other Ambulatory Visit (HOSPITAL_COMMUNITY): Payer: Self-pay | Admitting: Internal Medicine

## 2012-02-18 DIAGNOSIS — Z1231 Encounter for screening mammogram for malignant neoplasm of breast: Secondary | ICD-10-CM

## 2012-02-27 DIAGNOSIS — Z23 Encounter for immunization: Secondary | ICD-10-CM | POA: Diagnosis not present

## 2012-11-19 ENCOUNTER — Ambulatory Visit (HOSPITAL_COMMUNITY)
Admission: RE | Admit: 2012-11-19 | Discharge: 2012-11-19 | Disposition: A | Discharge status: Home / Self Care | Payer: Medicare PPO | Source: Ambulatory Visit | Attending: Internal Medicine | Admitting: Internal Medicine

## 2012-11-19 DIAGNOSIS — Z1231 Encounter for screening mammogram for malignant neoplasm of breast: Secondary | ICD-10-CM | POA: Insufficient documentation

## 2012-11-20 ENCOUNTER — Ambulatory Visit: Payer: Self-pay | Admitting: Obstetrics & Gynecology

## 2013-06-15 DIAGNOSIS — R609 Edema, unspecified: Secondary | ICD-10-CM | POA: Diagnosis not present

## 2013-06-15 DIAGNOSIS — I10 Essential (primary) hypertension: Secondary | ICD-10-CM | POA: Diagnosis not present

## 2013-06-16 ENCOUNTER — Other Ambulatory Visit (HOSPITAL_COMMUNITY): Payer: Self-pay | Admitting: Internal Medicine

## 2013-06-16 ENCOUNTER — Ambulatory Visit (HOSPITAL_COMMUNITY)
Admission: RE | Admit: 2013-06-16 | Discharge: 2013-06-16 | Disposition: A | Discharge status: Home / Self Care | Payer: Medicare Other | Source: Ambulatory Visit | Attending: Vascular Surgery | Admitting: Vascular Surgery

## 2013-06-16 DIAGNOSIS — R609 Edema, unspecified: Secondary | ICD-10-CM

## 2013-08-20 ENCOUNTER — Ambulatory Visit: Payer: Medicare Other | Admitting: Obstetrics & Gynecology

## 2013-08-20 DIAGNOSIS — R609 Edema, unspecified: Secondary | ICD-10-CM | POA: Diagnosis not present

## 2013-08-20 DIAGNOSIS — Z6828 Body mass index (BMI) 28.0-28.9, adult: Secondary | ICD-10-CM | POA: Diagnosis not present

## 2013-08-20 DIAGNOSIS — N200 Calculus of kidney: Secondary | ICD-10-CM | POA: Diagnosis not present

## 2013-08-20 DIAGNOSIS — I10 Essential (primary) hypertension: Secondary | ICD-10-CM | POA: Diagnosis not present

## 2013-09-29 DIAGNOSIS — I10 Essential (primary) hypertension: Secondary | ICD-10-CM | POA: Diagnosis not present

## 2013-09-29 DIAGNOSIS — J069 Acute upper respiratory infection, unspecified: Secondary | ICD-10-CM | POA: Diagnosis not present

## 2013-09-29 DIAGNOSIS — R05 Cough: Secondary | ICD-10-CM | POA: Diagnosis not present

## 2013-09-29 DIAGNOSIS — R059 Cough, unspecified: Secondary | ICD-10-CM | POA: Diagnosis not present

## 2013-09-29 DIAGNOSIS — Z6827 Body mass index (BMI) 27.0-27.9, adult: Secondary | ICD-10-CM | POA: Diagnosis not present

## 2013-10-26 ENCOUNTER — Other Ambulatory Visit (HOSPITAL_COMMUNITY): Payer: Self-pay | Admitting: Internal Medicine

## 2013-10-26 DIAGNOSIS — Z1231 Encounter for screening mammogram for malignant neoplasm of breast: Secondary | ICD-10-CM

## 2013-11-12 ENCOUNTER — Ambulatory Visit (INDEPENDENT_AMBULATORY_CARE_PROVIDER_SITE_OTHER): Payer: Medicare Other | Admitting: Obstetrics & Gynecology

## 2013-11-12 ENCOUNTER — Encounter: Payer: Self-pay | Admitting: Obstetrics & Gynecology

## 2013-11-12 VITALS — BP 131/74 | HR 61 | Temp 98.4°F | Wt 154.0 lb

## 2013-11-12 DIAGNOSIS — Z113 Encounter for screening for infections with a predominantly sexual mode of transmission: Secondary | ICD-10-CM | POA: Diagnosis not present

## 2013-11-12 DIAGNOSIS — Z01419 Encounter for gynecological examination (general) (routine) without abnormal findings: Secondary | ICD-10-CM | POA: Diagnosis not present

## 2013-11-12 NOTE — Progress Notes (Signed)
Subjective:     [name] is a 68 y.o. female here for a routine exam.  Current complaints: none.    Personal health questionnaire:  Is patient [name], have a family history of breast and/or ovarian cancer: no Is there a family history of uterine cancer diagnosed at age < 30, gastrointestinal cancer, urinary tract cancer, family member who is a Field seismologist syndrome-associated carrier: no Is the patient overweight and hypertensive, family history of diabetes, personal history of gestational diabetes or PCOS: yes Is patient over 71, have PCOS,  family history of premature CHD under age 68, diabetes, smoke, have hypertension or peripheral artery disease:  yes   Gynecologic History No LMP recorded. Patient is postmenopausal.  Last mammogram results were: normal  Obstetric History OB History  Gravida Para Term Preterm AB SAB TAB Ectopic Multiple Living  4 3 2 1 1 1    3     # Outcome Date GA Lbr Len/2nd Weight Sex Delivery Anes PTL Lv  4 TRM 86    F SVD   Y  3 SAB 1976          2 TRM 1970    F SVD   Y  1 PRE 1968    M SVD   Y      Past Medical History  Diagnosis Date  . Hypertension     Past Surgical History  Procedure Laterality Date  . Replacement total knee Left     Current outpatient prescriptions:aspirin 81 MG tablet, Take 81 mg by mouth daily., Disp: , Rfl: ;  calcium carbonate (OS-CAL) 600 MG TABS tablet, Take 600 mg by mouth 2 (two) times daily with a meal., Disp: , Rfl: ;  carvedilol (COREG) 25 MG tablet, Take 25 mg by mouth 2 (two) times daily with a meal., Disp: , Rfl: ;  Cinnamon 500 MG capsule, Take 500 mg by mouth daily., Disp: , Rfl: ;  Cyanocobalamin (VITAMIN B 12 PO), Take by mouth., Disp: , Rfl:  Methylsulfonylmethane (MSM) 1000 MG TABS, Take by mouth., Disp: , Rfl: ;  Multiple Vitamins-Minerals (MULTIVITAMIN WITH MINERALS) tablet, Take 1 tablet by mouth daily., Disp: , Rfl: ;  Omega-3 Fatty Acids (FISH OIL) 1000 MG CAPS, Take by mouth., Disp: , Rfl: ;   vitamin E 400 UNIT capsule, Take 400 Units by mouth daily., Disp: , Rfl:  Not on File  History  Substance Use Topics  . Smoking status: Never Smoker   . Smokeless tobacco: Not on file  . Alcohol Use: No     Comment: occasional    Family History  Problem Relation Age of Onset  . Cancer Mother   . Heart disease Father       Review of Systems  Constitutional: negative for fatigue; positive for weight loss Respiratory: negative for cough and wheezing Cardiovascular: negative for chest pain, fatigue and palpitations Gastrointestinal: negative for abdominal pain and change in bowel habits Musculoskeletal:negative for myalgias Neurological: negative for gait problems and tremors Behavioral/Psych: negative for abusive relationship, depression Endocrine: negative for temperature intolerance   Genitourinary:negative for abnormal menstrual periods, genital lesions, hot flashes, sexual problems and vaginal discharge Integument/breast: negative for breast lump, breast tenderness, nipple discharge and skin lesion(s)    Objective:       There were no vitals taken for this visit. General:   alert  Skin:   no rash or abnormalities  Lungs:   clear to auscultation bilaterally  Heart:   regular rate and rhythm, S1, S2  normal, no murmur, click, rub or gallop  Breasts:   normal without suspicious masses, skin or nipple changes or axillary nodes; left nipple inverted  Abdomen:  normal findings: no organomegaly, soft, non-tender and no hernia  Pelvis:  External genitalia: normal general appearance Urinary system: urethral meatus normal and bladder without fullness, nontender Vaginal: normal without tenderness, induration or masses Cervix: normal appearance Adnexa: normal bimanual exam Uterus: anteverted and non-tender, normal size   Lab Review Urine pregnancy test Labs reviewed no Radiologic studies reviewed no    Assessment:    Healthy female exam.    Plan:    Education  reviewed: calcium supplements, low fat, low cholesterol diet, safe sex/STD prevention and weight bearing exercise.   Meds ordered this encounter  Medications  . carvedilol (COREG) 25 MG tablet    Sig: Take 25 mg by mouth 2 (two) times daily with a meal.  . aspirin 81 MG tablet    Sig: Take 81 mg by mouth daily.  . Methylsulfonylmethane (MSM) 1000 MG TABS    Sig: Take by mouth.  . Omega-3 Fatty Acids (FISH OIL) 1000 MG CAPS    Sig: Take by mouth.  . Cyanocobalamin (VITAMIN B 12 PO)    Sig: Take by mouth.  . calcium carbonate (OS-CAL) 600 MG TABS tablet    Sig: Take 600 mg by mouth 2 (two) times daily with a meal.  . vitamin E 400 UNIT capsule    Sig: Take 400 Units by mouth daily.  . Multiple Vitamins-Minerals (MULTIVITAMIN WITH MINERALS) tablet    Sig: Take 1 tablet by mouth daily.  . Cinnamon 500 MG capsule    Sig: Take 500 mg by mouth daily.   Orders Placed This Encounter  Procedures  . HIV antibody  . RPR    Follow up as needed.

## 2013-11-13 LAB — HIV ANTIBODY (ROUTINE TESTING W REFLEX): HIV 1&2 Ab, 4th Generation: NONREACTIVE

## 2013-11-13 LAB — RPR

## 2013-11-13 NOTE — Patient Instructions (Signed)
Bone Health Our bones do many things. They provide structure, protect organs, anchor muscles, and store calcium. Adequate calcium in your diet and weight-bearing physical activity help build strong bones, improve bone amounts, and may reduce the risk of weakening of bones (osteoporosis) later in life. PEAK BONE MASS By age 68, the average woman has acquired most of her skeletal bone mass. A large decline occurs in older adults which increases the risk of osteoporosis. In women this occurs around the time of menopause. It is important for young girls to reach their peak bone mass in order to maintain bone health throughout life. A person with high bone mass as a young adult will be more likely to have a higher bone mass later in life. Not enough calcium consumption and physical activity early on could result in a failure to achieve optimum bone mass in adulthood. OSTEOPOROSIS Osteoporosis is a disease of the bones. It is defined as low bone mass with deterioration of bone structure. Osteoporosis leads to an increase risk of fractures with falls. These fractures commonly happen in the wrist, hip, and spine. While men and women of all ages and background can develop osteoporosis, some of the risk factors for osteoporosis are:  Female.  White.  Postmenopausal.  Older adults.  Small in body size.  Eating a diet low in calcium.  Physically inactive.  Smoking.  Use of some medications.  Family history. CALCIUM Calcium is a mineral needed by the body for healthy bones, teeth, and proper function of the heart, muscles, and nerves. The body cannot produce calcium so it must be absorbed through food. Good sources of calcium include:  Dairy products (low fat or nonfat milk, cheese, and yogurt).  Dark green leafy vegetables (bok choy and broccoli).  Calcium fortified foods (orange juice, cereal, bread, soy beverages, and tofu products).  Nuts (almonds). Recommended amounts of calcium vary  for individuals. RECOMMENDED CALCIUM INTAKES Age and Amount in mg per day  Children 1 to 3 years / 700 mg  Children 4 to 8 years / 1,000 mg  Children 9 to 13 years / 1,300 mg  Teens 14 to 18 years / 1,300 mg  Adults 19 to 50 years / 1,000 mg  Adult women 51 to 70 years / 1,200 mg  Adults 71 years and older / 1,200 mg  Pregnant and breastfeeding teens / 1,300 mg  Pregnant and breastfeeding adults / 1,000 mg Vitamin D also plays an important role in healthy bone development. Vitamin D helps in the absorption of calcium. WEIGHT-BEARING PHYSICAL ACTIVITY Regular physical activity has many positive health benefits. Benefits include strong bones. Weight-bearing physical activity early in life is important in reaching peak bone mass. Weight-bearing physical activities cause muscles and bones to work against gravity. Some examples of weight bearing physical activities include:  Walking, jogging, or running.  Field Hockey.  Jumping rope.  Dancing.  Soccer.  Tennis or Racquetball.  Stair climbing.  Basketball.  Hiking.  Weight lifting.  Aerobic fitness classes. Including weight-bearing physical activity into an exercise plan is a great way to keep bones healthy. Adults: Engage in at least 30 minutes of moderate physical activity on most, preferably all, days of the week. Children: Engage in at least 60 minutes of moderate physical activity on most, preferably all, days of the week. FOR MORE INFORMATION [place] Department of Agriculture, Center for Nutrition Policy and Promotion: www.cnpp.usda.gov National Osteoporosis Foundation: www.nof.org Document Released: 07/28/2003 Document Revised: 09/01/2012 Document Reviewed: 10/27/2008 ExitCare Patient Information   2015 [employer]. This information is not intended to replace advice given to you by your health care provider. Make sure you discuss any questions you have with your health care provider.  

## 2013-11-19 DIAGNOSIS — R609 Edema, unspecified: Secondary | ICD-10-CM | POA: Diagnosis not present

## 2013-11-19 DIAGNOSIS — I10 Essential (primary) hypertension: Secondary | ICD-10-CM | POA: Diagnosis not present

## 2013-11-19 DIAGNOSIS — Z6827 Body mass index (BMI) 27.0-27.9, adult: Secondary | ICD-10-CM | POA: Diagnosis not present

## 2013-11-27 DIAGNOSIS — R439 Unspecified disturbances of smell and taste: Secondary | ICD-10-CM | POA: Diagnosis not present

## 2013-11-30 ENCOUNTER — Ambulatory Visit (HOSPITAL_COMMUNITY)
Admission: RE | Admit: 2013-11-30 | Discharge: 2013-11-30 | Disposition: A | Discharge status: Home / Self Care | Payer: Medicare Other | Source: Ambulatory Visit | Attending: Internal Medicine | Admitting: Internal Medicine

## 2013-11-30 DIAGNOSIS — Z1231 Encounter for screening mammogram for malignant neoplasm of breast: Secondary | ICD-10-CM | POA: Diagnosis not present

## 2014-03-19 DIAGNOSIS — J209 Acute bronchitis, unspecified: Secondary | ICD-10-CM | POA: Diagnosis not present

## 2014-03-22 ENCOUNTER — Encounter: Payer: Self-pay | Admitting: Obstetrics & Gynecology

## 2014-05-17 ENCOUNTER — Encounter: Payer: Self-pay | Admitting: *Deleted

## 2014-05-18 ENCOUNTER — Encounter: Payer: Self-pay | Admitting: Obstetrics & Gynecology

## 2014-06-29 DIAGNOSIS — R42 Dizziness and giddiness: Secondary | ICD-10-CM | POA: Diagnosis not present

## 2014-06-29 DIAGNOSIS — Z6827 Body mass index (BMI) 27.0-27.9, adult: Secondary | ICD-10-CM | POA: Diagnosis not present

## 2014-06-29 DIAGNOSIS — R05 Cough: Secondary | ICD-10-CM | POA: Diagnosis not present

## 2014-07-07 DIAGNOSIS — H2513 Age-related nuclear cataract, bilateral: Secondary | ICD-10-CM | POA: Diagnosis not present

## 2014-07-07 DIAGNOSIS — H04223 Epiphora due to insufficient drainage, bilateral lacrimal glands: Secondary | ICD-10-CM | POA: Diagnosis not present

## 2014-07-07 DIAGNOSIS — H25013 Cortical age-related cataract, bilateral: Secondary | ICD-10-CM | POA: Diagnosis not present

## 2014-07-19 DIAGNOSIS — H25013 Cortical age-related cataract, bilateral: Secondary | ICD-10-CM | POA: Diagnosis not present

## 2014-07-19 DIAGNOSIS — H04123 Dry eye syndrome of bilateral lacrimal glands: Secondary | ICD-10-CM | POA: Diagnosis not present

## 2014-08-16 DIAGNOSIS — H25013 Cortical age-related cataract, bilateral: Secondary | ICD-10-CM | POA: Diagnosis not present

## 2014-08-16 DIAGNOSIS — H2513 Age-related nuclear cataract, bilateral: Secondary | ICD-10-CM | POA: Diagnosis not present

## 2014-09-02 DIAGNOSIS — H25811 Combined forms of age-related cataract, right eye: Secondary | ICD-10-CM | POA: Diagnosis not present

## 2014-09-02 DIAGNOSIS — H25011 Cortical age-related cataract, right eye: Secondary | ICD-10-CM | POA: Diagnosis not present

## 2014-09-06 DIAGNOSIS — I10 Essential (primary) hypertension: Secondary | ICD-10-CM | POA: Diagnosis not present

## 2014-09-11 DIAGNOSIS — J309 Allergic rhinitis, unspecified: Secondary | ICD-10-CM | POA: Diagnosis not present

## 2014-09-11 DIAGNOSIS — R42 Dizziness and giddiness: Secondary | ICD-10-CM | POA: Diagnosis not present

## 2014-10-20 DIAGNOSIS — N390 Urinary tract infection, site not specified: Secondary | ICD-10-CM | POA: Diagnosis not present

## 2014-10-20 DIAGNOSIS — R8299 Other abnormal findings in urine: Secondary | ICD-10-CM | POA: Diagnosis not present

## 2014-10-20 DIAGNOSIS — E785 Hyperlipidemia, unspecified: Secondary | ICD-10-CM | POA: Diagnosis not present

## 2014-10-20 DIAGNOSIS — I10 Essential (primary) hypertension: Secondary | ICD-10-CM | POA: Diagnosis not present

## 2014-10-27 DIAGNOSIS — Z6827 Body mass index (BMI) 27.0-27.9, adult: Secondary | ICD-10-CM | POA: Diagnosis not present

## 2014-10-27 DIAGNOSIS — I10 Essential (primary) hypertension: Secondary | ICD-10-CM | POA: Diagnosis not present

## 2014-10-27 DIAGNOSIS — Z1389 Encounter for screening for other disorder: Secondary | ICD-10-CM | POA: Diagnosis not present

## 2014-10-27 DIAGNOSIS — N200 Calculus of kidney: Secondary | ICD-10-CM | POA: Diagnosis not present

## 2014-10-27 DIAGNOSIS — R600 Localized edema: Secondary | ICD-10-CM | POA: Diagnosis not present

## 2014-10-27 DIAGNOSIS — R05 Cough: Secondary | ICD-10-CM | POA: Diagnosis not present

## 2014-10-27 DIAGNOSIS — Z0000 Encounter for general adult medical examination without abnormal findings: Secondary | ICD-10-CM | POA: Diagnosis not present

## 2014-10-27 DIAGNOSIS — E785 Hyperlipidemia, unspecified: Secondary | ICD-10-CM | POA: Diagnosis not present

## 2014-10-27 DIAGNOSIS — Z23 Encounter for immunization: Secondary | ICD-10-CM | POA: Diagnosis not present

## 2014-10-28 DIAGNOSIS — Z1212 Encounter for screening for malignant neoplasm of rectum: Secondary | ICD-10-CM | POA: Diagnosis not present

## 2014-11-03 DIAGNOSIS — Z780 Asymptomatic menopausal state: Secondary | ICD-10-CM | POA: Diagnosis not present

## 2014-11-29 DIAGNOSIS — J329 Chronic sinusitis, unspecified: Secondary | ICD-10-CM | POA: Diagnosis not present

## 2014-11-29 DIAGNOSIS — Z1211 Encounter for screening for malignant neoplasm of colon: Secondary | ICD-10-CM | POA: Diagnosis not present

## 2014-11-29 DIAGNOSIS — R51 Headache: Secondary | ICD-10-CM | POA: Diagnosis not present

## 2014-12-07 DIAGNOSIS — K31819 Angiodysplasia of stomach and duodenum without bleeding: Secondary | ICD-10-CM | POA: Diagnosis not present

## 2014-12-07 DIAGNOSIS — D123 Benign neoplasm of transverse colon: Secondary | ICD-10-CM | POA: Diagnosis not present

## 2014-12-07 DIAGNOSIS — K621 Rectal polyp: Secondary | ICD-10-CM | POA: Diagnosis not present

## 2014-12-07 DIAGNOSIS — R1013 Epigastric pain: Secondary | ICD-10-CM | POA: Diagnosis not present

## 2014-12-07 DIAGNOSIS — Z1211 Encounter for screening for malignant neoplasm of colon: Secondary | ICD-10-CM | POA: Diagnosis not present

## 2014-12-07 DIAGNOSIS — K635 Polyp of colon: Secondary | ICD-10-CM | POA: Diagnosis not present

## 2014-12-07 DIAGNOSIS — K5730 Diverticulosis of large intestine without perforation or abscess without bleeding: Secondary | ICD-10-CM | POA: Diagnosis not present

## 2014-12-07 DIAGNOSIS — D128 Benign neoplasm of rectum: Secondary | ICD-10-CM | POA: Diagnosis not present

## 2015-04-21 DIAGNOSIS — E784 Other hyperlipidemia: Secondary | ICD-10-CM | POA: Diagnosis not present

## 2015-04-21 DIAGNOSIS — E876 Hypokalemia: Secondary | ICD-10-CM | POA: Diagnosis not present

## 2015-04-21 DIAGNOSIS — Z6827 Body mass index (BMI) 27.0-27.9, adult: Secondary | ICD-10-CM | POA: Diagnosis not present

## 2015-04-21 DIAGNOSIS — I10 Essential (primary) hypertension: Secondary | ICD-10-CM | POA: Diagnosis not present

## 2015-07-29 DIAGNOSIS — I10 Essential (primary) hypertension: Secondary | ICD-10-CM | POA: Diagnosis not present

## 2015-07-29 DIAGNOSIS — Z6827 Body mass index (BMI) 27.0-27.9, adult: Secondary | ICD-10-CM | POA: Diagnosis not present

## 2015-07-29 DIAGNOSIS — R600 Localized edema: Secondary | ICD-10-CM | POA: Diagnosis not present

## 2015-07-29 DIAGNOSIS — R0789 Other chest pain: Secondary | ICD-10-CM | POA: Diagnosis not present

## 2015-08-02 ENCOUNTER — Other Ambulatory Visit: Payer: Self-pay | Admitting: Internal Medicine

## 2015-08-02 DIAGNOSIS — I159 Secondary hypertension, unspecified: Secondary | ICD-10-CM

## 2015-08-17 DIAGNOSIS — E876 Hypokalemia: Secondary | ICD-10-CM | POA: Diagnosis not present

## 2015-09-27 ENCOUNTER — Other Ambulatory Visit: Payer: Self-pay

## 2015-09-27 DIAGNOSIS — Z1231 Encounter for screening mammogram for malignant neoplasm of breast: Secondary | ICD-10-CM

## 2015-10-05 ENCOUNTER — Ambulatory Visit
Admission: RE | Admit: 2015-10-05 | Discharge: 2015-10-05 | Disposition: A | Discharge status: Home / Self Care | Payer: Medicare Other | Source: Ambulatory Visit

## 2015-10-05 DIAGNOSIS — Z1231 Encounter for screening mammogram for malignant neoplasm of breast: Secondary | ICD-10-CM | POA: Diagnosis not present

## 2015-10-10 ENCOUNTER — Other Ambulatory Visit: Payer: Self-pay | Admitting: Internal Medicine

## 2015-10-12 ENCOUNTER — Other Ambulatory Visit: Payer: Self-pay | Admitting: Internal Medicine

## 2015-10-12 DIAGNOSIS — I159 Secondary hypertension, unspecified: Secondary | ICD-10-CM

## 2015-10-20 ENCOUNTER — Ambulatory Visit
Admission: RE | Admit: 2015-10-20 | Discharge: 2015-10-20 | Disposition: A | Discharge status: Home / Self Care | Payer: Medicare Other | Source: Ambulatory Visit | Attending: Internal Medicine | Admitting: Internal Medicine

## 2015-10-20 ENCOUNTER — Other Ambulatory Visit: Payer: Medicare Other

## 2015-10-20 DIAGNOSIS — I159 Secondary hypertension, unspecified: Secondary | ICD-10-CM

## 2015-10-20 DIAGNOSIS — N281 Cyst of kidney, acquired: Secondary | ICD-10-CM | POA: Diagnosis not present

## 2015-11-02 DIAGNOSIS — H2512 Age-related nuclear cataract, left eye: Secondary | ICD-10-CM | POA: Diagnosis not present

## 2015-11-02 DIAGNOSIS — Z961 Presence of intraocular lens: Secondary | ICD-10-CM | POA: Diagnosis not present

## 2015-11-02 DIAGNOSIS — H25012 Cortical age-related cataract, left eye: Secondary | ICD-10-CM | POA: Diagnosis not present

## 2015-11-02 DIAGNOSIS — H5212 Myopia, left eye: Secondary | ICD-10-CM | POA: Diagnosis not present

## 2015-12-05 ENCOUNTER — Ambulatory Visit (INDEPENDENT_AMBULATORY_CARE_PROVIDER_SITE_OTHER): Payer: Medicare Other | Admitting: Gynecology

## 2015-12-05 ENCOUNTER — Encounter: Payer: Self-pay | Admitting: Gynecology

## 2015-12-05 VITALS — BP 140/86 | Ht 62.5 in | Wt 155.0 lb

## 2015-12-05 DIAGNOSIS — Z780 Asymptomatic menopausal state: Secondary | ICD-10-CM | POA: Diagnosis not present

## 2015-12-05 DIAGNOSIS — Z79890 Hormone replacement therapy (postmenopausal): Secondary | ICD-10-CM

## 2015-12-05 DIAGNOSIS — N952 Postmenopausal atrophic vaginitis: Secondary | ICD-10-CM | POA: Diagnosis not present

## 2015-12-05 DIAGNOSIS — Z01419 Encounter for gynecological examination (general) (routine) without abnormal findings: Secondary | ICD-10-CM | POA: Diagnosis not present

## 2015-12-05 DIAGNOSIS — Z124 Encounter for screening for malignant neoplasm of cervix: Secondary | ICD-10-CM | POA: Diagnosis not present

## 2015-12-05 MED ORDER — ESTRADIOL 10 MCG VA TABS: 1.0000 | ORAL_TABLET | VAGINAL | Status: DC

## 2015-12-05 NOTE — Progress Notes (Signed)
[name] 1945/11/29 [id number]   History:    70 y.o.  for annual gyn exam is a new patient to the practice. Patient stated her last gynecological exam was over 2 years ago by Dr. Chrissie Noa more. She reports no past history of any abnormal menstrual cycles. Patient has never been on any hormone replacement therapy she in frequently is sexually active but has complaining of vaginal dryness and irritation. No vaginal bleeding reported. She had a colonoscopy in 2016 which she reports that benign polyps were removed and she is on a 5 year recall. Her PCP is Dr. Bevelyn Buckles who doing her blood work and or bring her bone density studies.  Past medical history,surgical history, family history and social history were all reviewed and documented in the EPIC chart.  Gynecologic History No LMP recorded. Patient is postmenopausal. Contraception: post menopausal status Last Pap: Several years ago. Results were: normal Last mammogram: 2017. Results were: normal  Obstetric History OB History  Gravida Para Term Preterm AB SAB TAB Ectopic Multiple Living  4 3 2 1 1 1    3     # Outcome Date GA Lbr Len/2nd Weight Sex Delivery Anes PTL Lv  4 Term 1978    F Vag-Spont   Y  3 SAB 1976          2 Term 1970    F Vag-Spont   Y  1 Preterm 1968    M Vag-Spont   Y       ROS: A ROS was performed and pertinent positives and negatives are included in the history.  GENERAL: No fevers or chills. HEENT: No change in vision, no earache, sore throat or sinus congestion. NECK: No pain or stiffness. CARDIOVASCULAR: No chest pain or pressure. No palpitations. PULMONARY: No shortness of breath, cough or wheeze. GASTROINTESTINAL: No abdominal pain, nausea, vomiting or diarrhea, melena or bright red blood per rectum. GENITOURINARY: No urinary frequency, urgency, hesitancy or dysuria. MUSCULOSKELETAL: No joint or muscle pain, no back pain, no recent trauma. DERMATOLOGIC: No rash, no itching, no lesions.  ENDOCRINE: No polyuria, polydipsia, no heat or cold intolerance. No recent change in weight. HEMATOLOGICAL: No anemia or easy bruising or bleeding. NEUROLOGIC: No headache, seizures, numbness, tingling or weakness. PSYCHIATRIC: No depression, no loss of interest in normal activity or change in sleep pattern.     Exam: chaperone present  BP 140/86 mmHg  Ht 5' 2.5" (1.588 m)  Wt 155 lb (70.308 kg)  BMI 27.88 kg/m2  Body mass index is 27.88 kg/(m^2).  General appearance : Well developed well nourished female. No acute distress HEENT: Eyes: no retinal hemorrhage or exudates,  Neck supple, trachea midline, no carotid bruits, no thyroidmegaly Lungs: Clear to auscultation, no rhonchi or wheezes, or rib retractions  Heart: Regular rate and rhythm, no murmurs or gallops Breast:Examined in sitting and supine position were symmetrical in appearance, no palpable masses or tenderness,  no skin retraction, no nipple inversion, no nipple discharge, no skin discoloration, no axillary or supraclavicular lymphadenopathy Abdomen: no palpable masses or tenderness, no rebound or guarding Extremities: no edema or skin discoloration or tenderness  Pelvic:  Bartholin, Urethra, Skene Glands: Within normal limits             Vagina: No gross lesions or discharge, atrophic changes  Cervix: No gross lesions or discharge  Uterus  anteverted, normal size, shape and consistency, non-tender and mobile  Adnexa  Without masses or tenderness  Anus and perineum  normal   Rectovaginal  normal sphincter tone without palpated masses or tenderness             Hemoccult PCP provides     Assessment/Plan:  70 y.o. female for annual exam menopausal with atrophic vaginal epithelium complaining of vaginal dryness irritation and discomfort. We discussed different treatment options I'm going to prescribe her Vagifem 10 g tablet to apply twice a week. If this is not covered by her plan we discussed the compound estrogen cream that  could be applied twice a week as well. The risks benefits and pros and cons were discussed as well as literature information was provided. Pap smear with HPV screening done today. She was encouraged to do her monthly breast exams. She scheduled to see her PCP later this year I recommended that she bring up the conversation of her next bone density study and send [name] a copy.   Terrance Mass MD, 11:47 AM 12/05/2015

## 2015-12-05 NOTE — Patient Instructions (Signed)
Estradiol vaginal tablets What is this medicine? ESTRADIOL (es tra DYE ole) vaginal tablet is used to help relieve symptoms of vaginal irritation and dryness that occurs in some women during menopause. This medicine may be used for other purposes; ask your health care provider or pharmacist if you have questions. What should I tell my health care provider before I take this medicine? They need to know if you have any of these conditions: -abnormal vaginal bleeding -blood vessel disease or blood clots -breast, cervical, endometrial, ovarian, liver, or uterine cancer -dementia -diabetes -gallbladder disease -heart disease or recent heart attack -high blood pressure -high cholesterol -high level of calcium in the blood -hysterectomy -kidney disease -liver disease -migraine headaches -protein C deficiency -protein S deficiency -stroke -systemic lupus erythematosus (SLE) -tobacco smoker -an unusual or allergic reaction to estrogens, other hormones, medicines, foods, dyes, or preservatives -pregnant or trying to get pregnant -breast-feeding How should I use this medicine? This medicine is only for use in the vagina. Do not take by mouth. Wash and dry your hands before and after use. Read package directions carefully. Unwrap the applicator package. Be sure to use a new applicator for each dose. Use at the same time each day. If the tablet has fallen out of the applicator, but is still in the package, carefully place it back into the applicator. If the tablet has fallen out of the package, that applicator should be thrown out and you should use a new applicator containing a new tablet. Lie on your back, part and bend your knees. Gently insert the applicator as far as comfortably possible into the vagina. Then, gently press the plunger until the plunger is fully depressed. This will release the tablet into the vagina. Gently remove the applicator. Throw away the applicator after use. Do not use  your medicine more often than directed. Do not stop using except on the advice of your doctor or health care professional. Talk to your pediatrician regarding the use of this medicine in children. This medicine is not approved for use in children. A patient package insert for the product will be given with each prescription and refill. Read this sheet carefully each time. The sheet may change frequently. Overdosage: If you think you have taken too much of this medicine contact a poison control center or emergency room at once. NOTE: This medicine is only for you. Do not share this medicine with others. What if I miss a dose? If you miss a dose, take it as soon as you can. If it is almost time for your next dose, take only that dose. Do not take double or extra doses. What may interact with this medicine? Do not take this medicine with any of the following medications: -aromatase inhibitors like aminoglutethimide, anastrozole, exemestane, letrozole, testolactone This medicine may also interact with the following medications: -antibiotics used to treat tuberculosis like rifabutin, rifampin and rifapentene -raloxifene or tamoxifen -warfarin This list may not describe all possible interactions. Give your health care provider a list of all the medicines, herbs, non-prescription drugs, or dietary supplements you use. Also tell them if you smoke, drink alcohol, or use illegal drugs. Some items may interact with your medicine. What should I watch for while using this medicine? Visit your health care professional for regular checks on your progress. You will need a regular breast and pelvic exam. You should also discuss the need for regular mammograms with your health care professional, and follow his or her guidelines. This medicine can make  your body retain fluid, making your fingers, hands, or ankles swell. Your blood pressure can go up. Contact your doctor or health care professional if you feel you are  retaining fluid. If you have any reason to think you are pregnant; stop taking this medicine at once and contact your doctor or health care professional. Tobacco smoking increases the risk of getting a blood clot or having a stroke, especially if you are more than 70 years old. You are strongly advised not to smoke. If you wear contact lenses and notice visual changes, or if the lenses begin to feel uncomfortable, consult your eye care specialist. If you are going to have elective surgery, you may need to stop taking this medicine beforehand. Consult your health care professional for advice prior to scheduling the surgery. What side effects may I notice from receiving this medicine? Side effects that you should report to your doctor or health care professional as soon as possible: -allergic reactions like skin rash, itching or hives, swelling of the face, lips, or tongue -breast tissue changes or discharge -changes in vision -chest pain -confusion, trouble speaking or understanding -dark urine -general ill feeling or flu-like symptoms -light-colored stools -nausea, vomiting -pain, swelling, warmth in the leg -right upper belly pain -severe headaches -shortness of breath -sudden numbness or weakness of the face, arm or leg -trouble walking, dizziness, loss of balance or coordination -unusual vaginal bleeding -yellowing of the eyes or skin Side effects that usually do not require medical attention (report to your doctor or health care professional if they continue or are bothersome): -hair loss -increased hunger or thirst -increased urination -symptoms of vaginal infection like itching, irritation or unusual discharge -unusually weak or tired This list may not describe all possible side effects. Call your doctor for medical advice about side effects. You may report side effects to FDA at 1-800-FDA-1088. Where should I keep my medicine? Keep out of the reach of children. Store at room  temperature between 15 and 30 degrees C (59 and 86 degrees F). Throw away any unused medicine after the expiration date. NOTE: This sheet is a summary. It may not cover all possible information. If you have questions about this medicine, talk to your doctor, pharmacist, or health care provider.    2016, Elsevier/Gold Standard. (2014-04-21 09:22:51)

## 2015-12-07 LAB — PAP, TP IMAGING W/ HPV RNA, RFLX HPV TYPE 16,18/45: HPV mRNA, High Risk: NOT DETECTED

## 2015-12-20 DIAGNOSIS — I10 Essential (primary) hypertension: Secondary | ICD-10-CM | POA: Diagnosis not present

## 2015-12-20 DIAGNOSIS — E784 Other hyperlipidemia: Secondary | ICD-10-CM | POA: Diagnosis not present

## 2015-12-22 DIAGNOSIS — Z1212 Encounter for screening for malignant neoplasm of rectum: Secondary | ICD-10-CM | POA: Diagnosis not present

## 2015-12-27 DIAGNOSIS — I10 Essential (primary) hypertension: Secondary | ICD-10-CM | POA: Diagnosis not present

## 2015-12-27 DIAGNOSIS — R600 Localized edema: Secondary | ICD-10-CM | POA: Diagnosis not present

## 2015-12-27 DIAGNOSIS — Z1389 Encounter for screening for other disorder: Secondary | ICD-10-CM | POA: Diagnosis not present

## 2015-12-27 DIAGNOSIS — E784 Other hyperlipidemia: Secondary | ICD-10-CM | POA: Diagnosis not present

## 2015-12-27 DIAGNOSIS — Z6827 Body mass index (BMI) 27.0-27.9, adult: Secondary | ICD-10-CM | POA: Diagnosis not present

## 2015-12-27 DIAGNOSIS — M19049 Primary osteoarthritis, unspecified hand: Secondary | ICD-10-CM | POA: Diagnosis not present

## 2015-12-27 DIAGNOSIS — Z0000 Encounter for general adult medical examination without abnormal findings: Secondary | ICD-10-CM | POA: Diagnosis not present

## 2016-10-03 ENCOUNTER — Encounter: Payer: Self-pay | Admitting: Gynecology

## 2016-12-27 IMAGING — US US RENAL ARTERY STENOSIS
1 series · 14 of 25 positions shown · non-contrast
Comparison: None.

CLINICAL DATA: 69-year-old female with hypertension. Evaluate for
renal artery stenosis.

EXAM:
RENAL/URINARY TRACT ULTRASOUND
RENAL DUPLEX DOPPLER ULTRASOUND

[Series 1: us renal artery stenosis · 0.30mm/px · 14 of 72 slices shown]
[im 1/72]
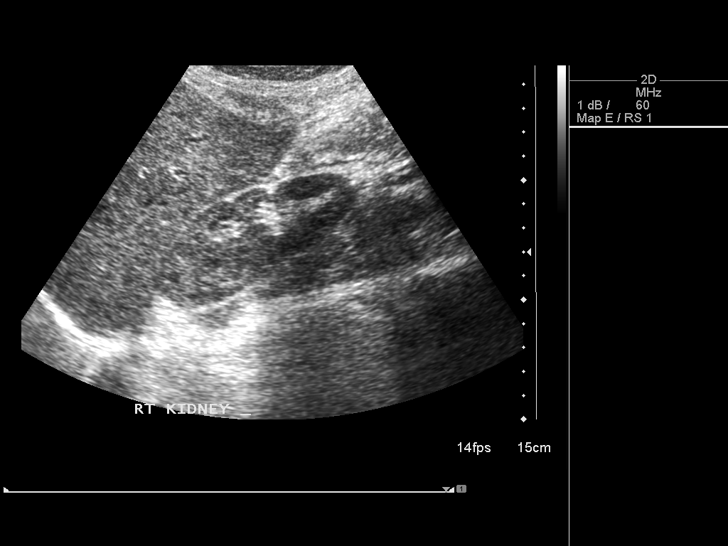
[im 6/72]
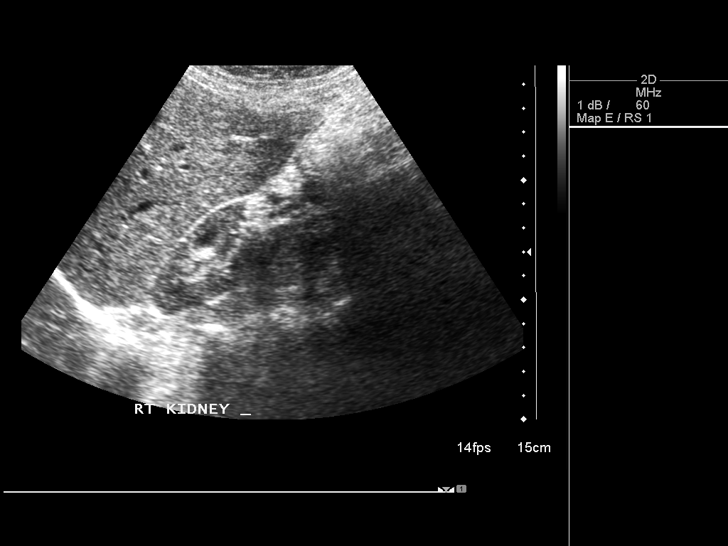
[im 12/72]
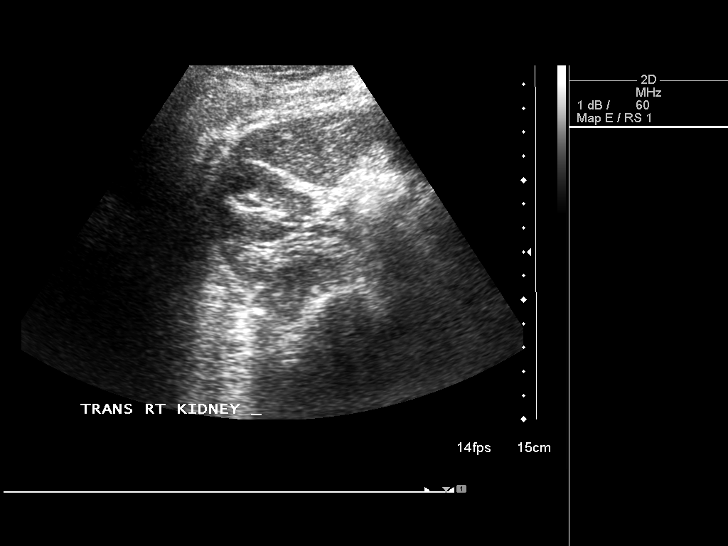
[im 18/72]
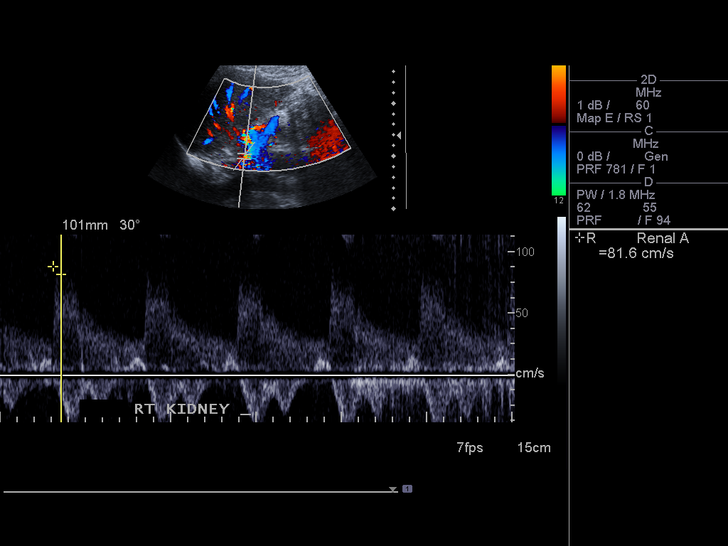
[im 24/72]
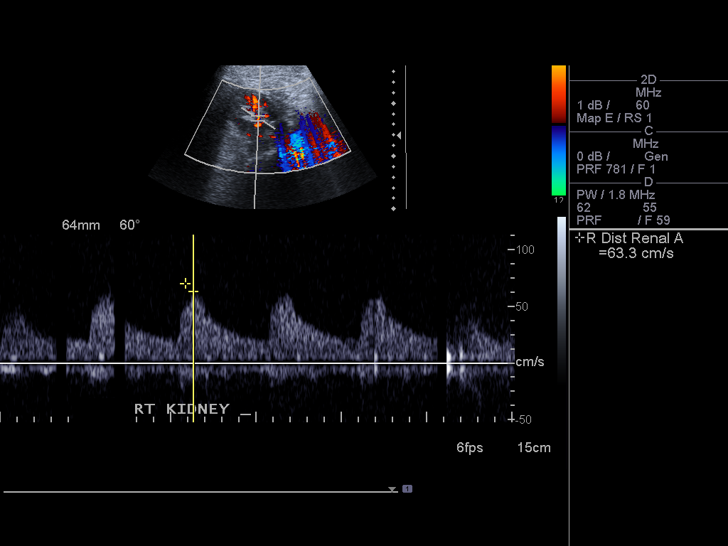
[im 27/72]
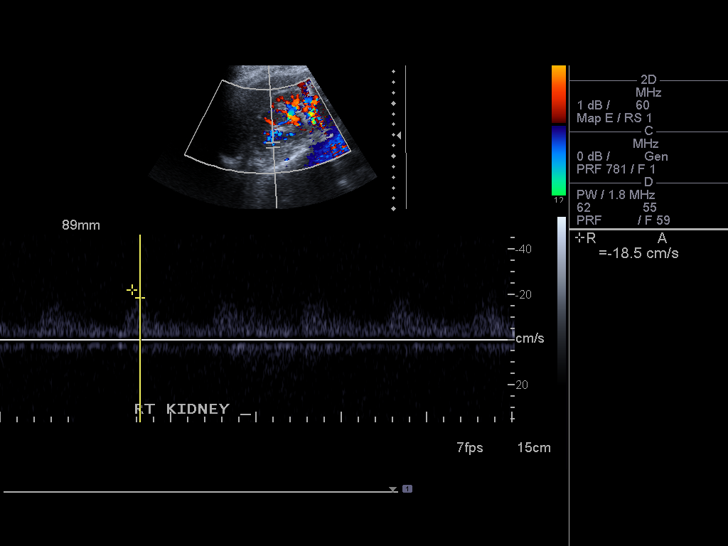
[im 33/72]
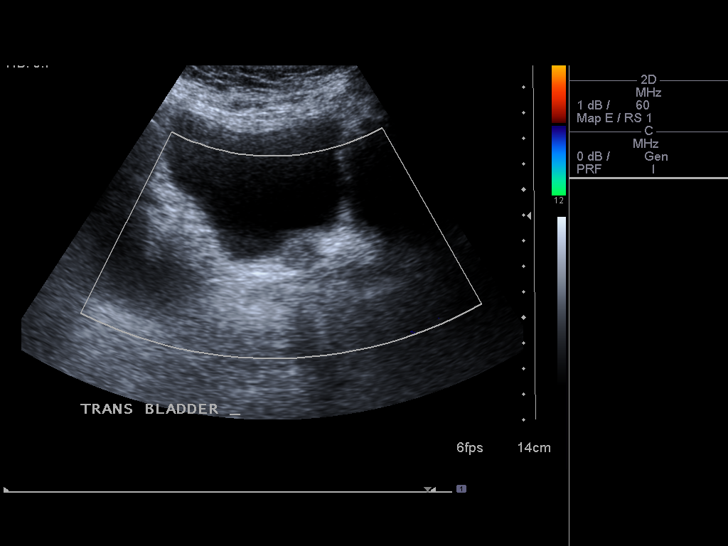
[im 39/72]
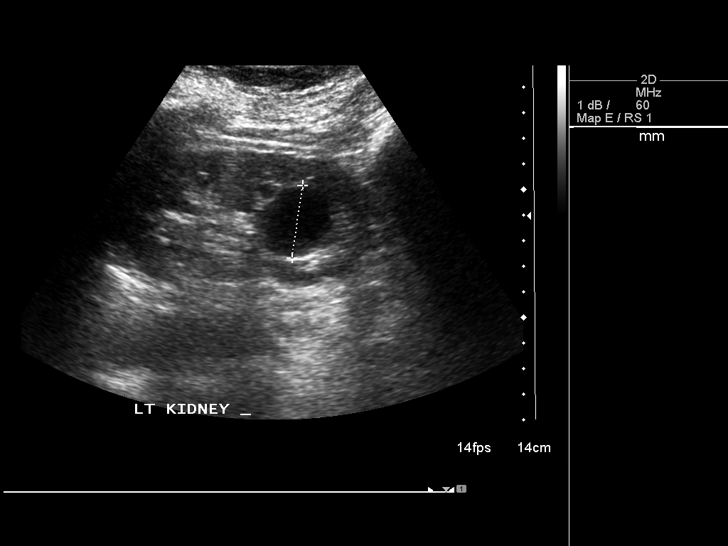
[im 45/72]
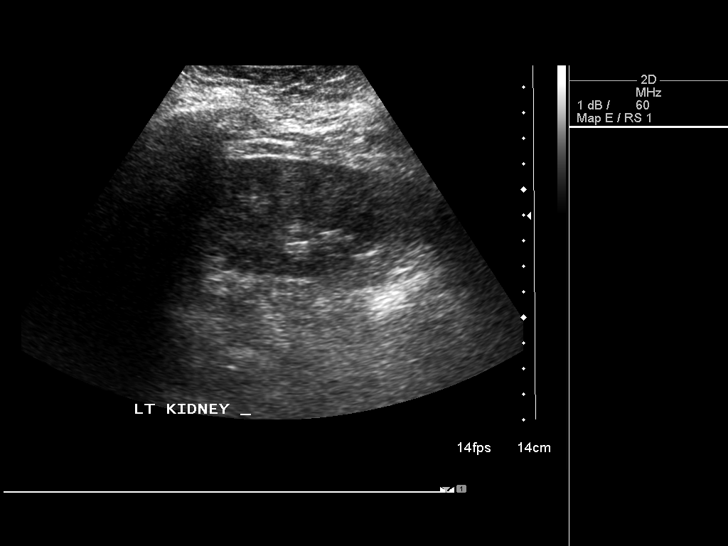
[im 48/72]
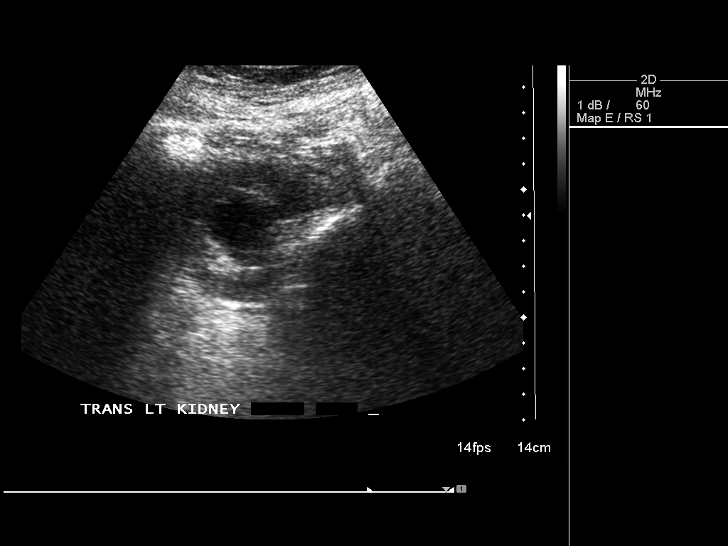
[im 54/72]
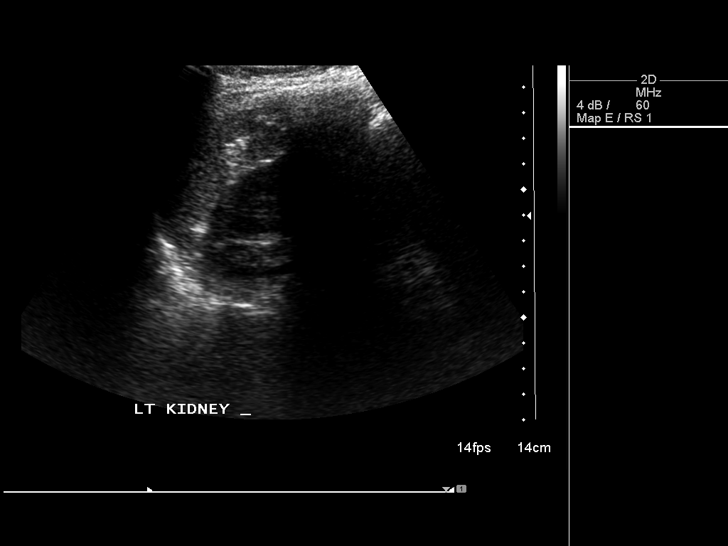
[im 60/72]
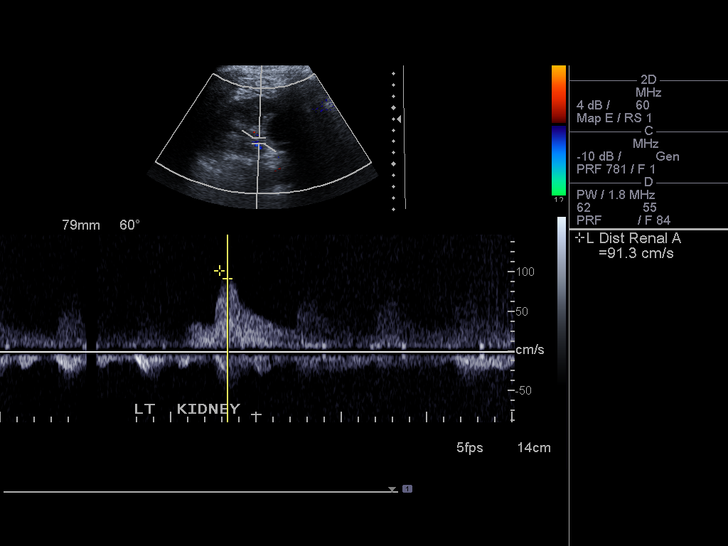
[im 66/72]
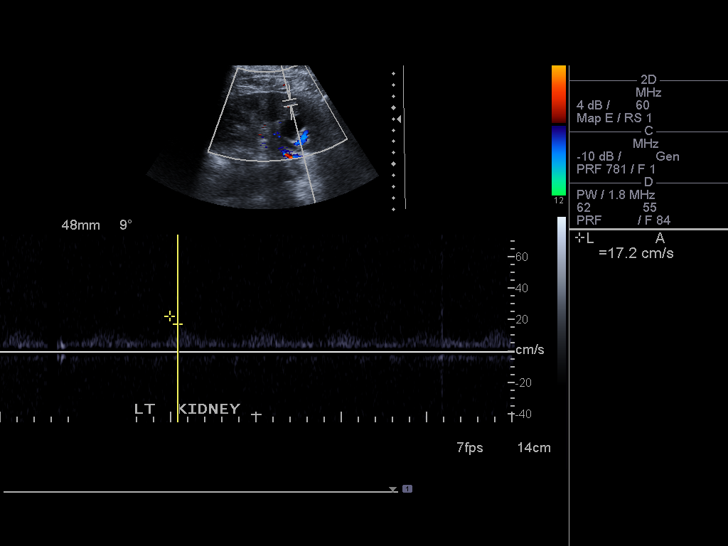
[im 72/72]
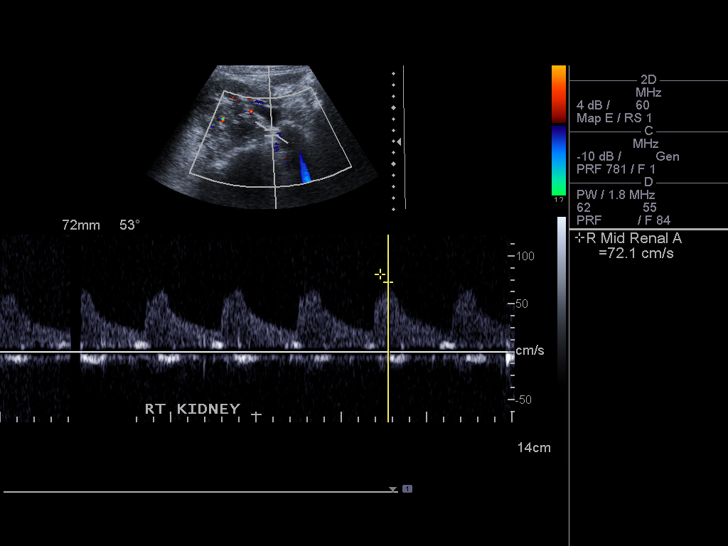

[14 of 25 positions shown; findings below may reference images not displayed]

FINDINGS: Right Kidney:

Length: 10.8 cm. Echogenic renal parenchyma. No solid mass or
hydronephrosis.

Left Kidney:

Length: 10.5 cm. Echogenic renal parenchyma. No hydronephrosis or
solid mass. Anechoic simple cyst in the lower pole measures 2.9 cm..

Bladder:  Within normal limits.

RENAL DUPLEX ULTRASOUND

Right Renal Artery Velocities:

Origin:  82 cm/sec

Mid:  72 cm/sec

Hilum:  58 cm/sec

Interlobar:  28 cm/sec

Arcuate:  17 Cm/sec

Left Renal Artery Velocities:

Origin:  94 cm/sec

Mid:  83 cm/sec

Hilum:  81 cm/sec

Interlobar:  27 cm/sec

Arcuate:  19 cm/sec

Aortic Velocity:  103 Cm/sec

Right Renal-Aortic Ratios:

Origin:

Mid:

Hilum:

Interlobar:

Arcuate:

Left Renal-Aortic Ratios:

Origin:

Mid:

Hilum:

Interlobar:

Arcuate:
IMPRESSION: 1. No evidence for hemodynamically significant renal artery
stenosis.
2. Echogenic renal parenchyma bilaterally suggests underlying
medical renal disease.
3. Left lower pole simple renal cyst.

## 2017-09-08 ENCOUNTER — Other Ambulatory Visit: Payer: Self-pay

## 2017-09-08 ENCOUNTER — Encounter (HOSPITAL_COMMUNITY): Admission: EM | Disposition: A | Discharge status: Home / Self Care | Payer: Self-pay | Source: Home / Self Care

## 2017-09-08 ENCOUNTER — Emergency Department (HOSPITAL_COMMUNITY): Payer: Medicare Other | Admitting: Anesthesiology

## 2017-09-08 ENCOUNTER — Inpatient Hospital Stay (HOSPITAL_COMMUNITY)
Admission: EM | Admit: 2017-09-08 | Discharge: 2017-09-12 | DRG: 336 | Disposition: A | Discharge status: Home / Self Care | Payer: Medicare Other | Attending: General Surgery | Admitting: General Surgery

## 2017-09-08 ENCOUNTER — Emergency Department (HOSPITAL_COMMUNITY): Payer: Medicare Other

## 2017-09-08 ENCOUNTER — Encounter (HOSPITAL_COMMUNITY): Payer: Self-pay | Admitting: Emergency Medicine

## 2017-09-08 DIAGNOSIS — K460 Unspecified abdominal hernia with obstruction, without gangrene: Secondary | ICD-10-CM | POA: Diagnosis present

## 2017-09-08 DIAGNOSIS — Z96652 Presence of left artificial knee joint: Secondary | ICD-10-CM | POA: Diagnosis present

## 2017-09-08 DIAGNOSIS — R1011 Right upper quadrant pain: Secondary | ICD-10-CM

## 2017-09-08 DIAGNOSIS — K56609 Unspecified intestinal obstruction, unspecified as to partial versus complete obstruction: Secondary | ICD-10-CM

## 2017-09-08 DIAGNOSIS — K660 Peritoneal adhesions (postprocedural) (postinfection): Secondary | ICD-10-CM | POA: Diagnosis present

## 2017-09-08 DIAGNOSIS — I10 Essential (primary) hypertension: Secondary | ICD-10-CM | POA: Diagnosis present

## 2017-09-08 DIAGNOSIS — K565 Intestinal adhesions [bands], unspecified as to partial versus complete obstruction: Principal | ICD-10-CM | POA: Diagnosis present

## 2017-09-08 DIAGNOSIS — K458 Other specified abdominal hernia without obstruction or gangrene: Secondary | ICD-10-CM

## 2017-09-08 HISTORY — PX: LAPAROSCOPY: SHX197

## 2017-09-08 HISTORY — PX: LAPAROSCOPIC LYSIS OF ADHESIONS: SHX5905

## 2017-09-08 LAB — COMPREHENSIVE METABOLIC PANEL
ALT: 25 U/L (ref 14–54)
AST: 36 U/L (ref 15–41)
Albumin: 4 g/dL (ref 3.5–5.0)
Alkaline Phosphatase: 59 U/L (ref 38–126)
Anion gap: 14 (ref 5–15)
BUN: 11 mg/dL (ref 6–20)
CO2: 25 mmol/L (ref 22–32)
Calcium: 9.6 mg/dL (ref 8.9–10.3)
Chloride: 100 mmol/L — ABNORMAL LOW (ref 101–111)
Creatinine, Ser: 0.78 mg/dL (ref 0.44–1.00)
GFR calc Af Amer: >60 mL/min (ref >60)
GFR calc non Af Amer: >60 mL/min (ref >60)
Glucose, Bld: 128 mg/dL — ABNORMAL HIGH (ref 65–99)
Potassium: 3.4 mmol/L — ABNORMAL LOW (ref 3.5–5.1)
Sodium: 139 mmol/L (ref 135–145)
Total Bilirubin: 1.4 mg/dL — ABNORMAL HIGH (ref 0.3–1.2)
Total Protein: 8.2 g/dL — ABNORMAL HIGH (ref 6.5–8.1)

## 2017-09-08 LAB — URINALYSIS, ROUTINE W REFLEX MICROSCOPIC
Bilirubin Urine: NEGATIVE
Glucose, UA: NEGATIVE mg/dL
Hgb urine dipstick: NEGATIVE
Ketones, ur: 5 mg/dL — AB
Leukocytes, UA: NEGATIVE
Nitrite: NEGATIVE
Protein, ur: NEGATIVE mg/dL
Specific Gravity, Urine: 1.01 (ref 1.005–1.030)
pH: 9 — ABNORMAL HIGH (ref 5.0–8.0)

## 2017-09-08 LAB — CBC
HCT: 39.6 % (ref 36.0–46.0)
Hemoglobin: 13.8 g/dL (ref 12.0–15.0)
MCH: 31.1 pg (ref 26.0–34.0)
MCHC: 34.8 g/dL (ref 30.0–36.0)
MCV: 89.2 fL (ref 78.0–100.0)
Platelets: 180 10*3/uL (ref 150–400)
RBC: 4.44 MIL/uL (ref 3.87–5.11)
RDW: 14.6 % (ref 11.5–15.5)
WBC: 5.5 10*3/uL (ref 4.0–10.5)

## 2017-09-08 LAB — LIPASE, BLOOD: Lipase: 31 U/L (ref 11–51)

## 2017-09-08 SURGERY — LAPAROSCOPY, DIAGNOSTIC
Anesthesia: General | Site: Abdomen

## 2017-09-08 MED ORDER — PHENYLEPHRINE 40 MCG/ML (10ML) SYRINGE FOR IV PUSH (FOR BLOOD PRESSURE SUPPORT)
PREFILLED_SYRINGE | INTRAVENOUS | Status: DC | PRN
  Administered 2017-09-08: 80 ug via INTRAVENOUS
  Administered 2017-09-08: 40 ug via INTRAVENOUS
  Administered 2017-09-08: 80 ug via INTRAVENOUS

## 2017-09-08 MED ORDER — BUPIVACAINE-EPINEPHRINE 0.25% -1:200000 IJ SOLN
INTRAMUSCULAR | Status: DC | PRN
  Administered 2017-09-08: 10 mL

## 2017-09-08 MED ORDER — IOPAMIDOL (ISOVUE-300) INJECTION 61%
100.0000 mL | Freq: Once | INTRAVENOUS | Status: AC | PRN
  Administered 2017-09-08: 100 mL via INTRAVENOUS

## 2017-09-08 MED ORDER — ACETAMINOPHEN 325 MG PO TABS: 650.0000 mg | ORAL_TABLET | Freq: Four times a day (QID) | ORAL | Status: DC | PRN

## 2017-09-08 MED ORDER — SODIUM CHLORIDE 0.9 % IR SOLN
Status: DC | PRN
  Administered 2017-09-08: 1000 mL

## 2017-09-08 MED ORDER — LACTATED RINGERS IV SOLN
INTRAVENOUS | Status: DC | PRN
  Administered 2017-09-08 (×2): via INTRAVENOUS

## 2017-09-08 MED ORDER — FENTANYL CITRATE (PF) 250 MCG/5ML IJ SOLN
INTRAMUSCULAR | Status: AC
  Filled 2017-09-08: qty 5

## 2017-09-08 MED ORDER — SODIUM CHLORIDE 0.9 % IV BOLUS
1000.0000 mL | Freq: Once | INTRAVENOUS | Status: AC
  Administered 2017-09-08: 1000 mL via INTRAVENOUS

## 2017-09-08 MED ORDER — ACETAMINOPHEN 650 MG RE SUPP: 650.0000 mg | Freq: Four times a day (QID) | RECTAL | Status: DC | PRN

## 2017-09-08 MED ORDER — PHENYLEPHRINE HCL 10 MG/ML IJ SOLN
INTRAVENOUS | Status: DC | PRN
  Administered 2017-09-08: 50 ug/min via INTRAVENOUS

## 2017-09-08 MED ORDER — ROCURONIUM BROMIDE 100 MG/10ML IV SOLN
INTRAVENOUS | Status: DC | PRN
  Administered 2017-09-08: 40 mg via INTRAVENOUS

## 2017-09-08 MED ORDER — ONDANSETRON HCL 4 MG/2ML IJ SOLN
INTRAMUSCULAR | Status: AC
  Filled 2017-09-08: qty 2

## 2017-09-08 MED ORDER — DIPHENHYDRAMINE HCL 50 MG/ML IJ SOLN: 12.5000 mg | Freq: Four times a day (QID) | INTRAMUSCULAR | Status: DC | PRN

## 2017-09-08 MED ORDER — POLYETHYLENE GLYCOL 3350 17 G PO PACK: 17.0000 g | PACK | Freq: Every day | ORAL | Status: DC | PRN

## 2017-09-08 MED ORDER — ONDANSETRON HCL 4 MG/2ML IJ SOLN
4.0000 mg | Freq: Once | INTRAMUSCULAR | Status: AC
  Administered 2017-09-08: 4 mg via INTRAVENOUS
  Filled 2017-09-08: qty 2

## 2017-09-08 MED ORDER — FENTANYL CITRATE (PF) 250 MCG/5ML IJ SOLN
INTRAMUSCULAR | Status: DC | PRN
  Administered 2017-09-08: 100 ug via INTRAVENOUS
  Administered 2017-09-08: 50 ug via INTRAVENOUS

## 2017-09-08 MED ORDER — BUPIVACAINE-EPINEPHRINE (PF) 0.25% -1:200000 IJ SOLN
INTRAMUSCULAR | Status: AC
Start: 2017-09-08 — End: 2017-09-08
  Filled 2017-09-08: qty 30

## 2017-09-08 MED ORDER — METOPROLOL TARTRATE 5 MG/5ML IV SOLN: 5.0000 mg | Freq: Four times a day (QID) | INTRAVENOUS | Status: DC | PRN

## 2017-09-08 MED ORDER — MIDAZOLAM HCL 2 MG/2ML IJ SOLN
INTRAMUSCULAR | Status: AC
  Filled 2017-09-08: qty 2

## 2017-09-08 MED ORDER — FENTANYL CITRATE (PF) 100 MCG/2ML IJ SOLN: 25.0000 ug | INTRAMUSCULAR | Status: DC | PRN

## 2017-09-08 MED ORDER — SUCCINYLCHOLINE 20MG/ML (10ML) SYRINGE FOR MEDFUSION PUMP - OPTIME
INTRAMUSCULAR | Status: DC | PRN
  Administered 2017-09-08: 120 mg via INTRAVENOUS

## 2017-09-08 MED ORDER — MORPHINE SULFATE (PF) 2 MG/ML IV SOLN: 2.0000 mg | INTRAVENOUS | Status: DC | PRN

## 2017-09-08 MED ORDER — ONDANSETRON HCL 4 MG/2ML IJ SOLN
INTRAMUSCULAR | Status: DC | PRN
  Administered 2017-09-08: 4 mg via INTRAVENOUS

## 2017-09-08 MED ORDER — ONDANSETRON 4 MG PO TBDP
4.0000 mg | ORAL_TABLET | Freq: Once | ORAL | Status: AC
  Administered 2017-09-08: 4 mg via ORAL
  Filled 2017-09-08: qty 1

## 2017-09-08 MED ORDER — FENTANYL CITRATE (PF) 100 MCG/2ML IJ SOLN
50.0000 ug | Freq: Once | INTRAMUSCULAR | Status: AC
  Administered 2017-09-08: 50 ug via INTRAVENOUS
  Filled 2017-09-08: qty 2

## 2017-09-08 MED ORDER — ONDANSETRON 4 MG PO TBDP: 4.0000 mg | ORAL_TABLET | Freq: Four times a day (QID) | ORAL | Status: DC | PRN

## 2017-09-08 MED ORDER — SUGAMMADEX SODIUM 200 MG/2ML IV SOLN
INTRAVENOUS | Status: DC | PRN
  Administered 2017-09-08: 150 mg via INTRAVENOUS

## 2017-09-08 MED ORDER — DIPHENHYDRAMINE HCL 12.5 MG/5ML PO ELIX: 12.5000 mg | ORAL_SOLUTION | Freq: Four times a day (QID) | ORAL | Status: DC | PRN

## 2017-09-08 MED ORDER — ONDANSETRON HCL 4 MG/2ML IJ SOLN: 4.0000 mg | Freq: Four times a day (QID) | INTRAMUSCULAR | Status: DC | PRN

## 2017-09-08 MED ORDER — DEXAMETHASONE SODIUM PHOSPHATE 10 MG/ML IJ SOLN
INTRAMUSCULAR | Status: DC | PRN
  Administered 2017-09-08: 4 mg via INTRAVENOUS

## 2017-09-08 MED ORDER — DEXAMETHASONE SODIUM PHOSPHATE 10 MG/ML IJ SOLN
INTRAMUSCULAR | Status: AC
  Filled 2017-09-08: qty 1

## 2017-09-08 MED ORDER — MIDAZOLAM HCL 5 MG/5ML IJ SOLN
INTRAMUSCULAR | Status: DC | PRN
  Administered 2017-09-08: 1 mg via INTRAVENOUS

## 2017-09-08 MED ORDER — SUGAMMADEX SODIUM 200 MG/2ML IV SOLN
INTRAVENOUS | Status: AC
  Filled 2017-09-08: qty 2

## 2017-09-08 MED ORDER — ONDANSETRON HCL 4 MG/2ML IJ SOLN: 4.0000 mg | Freq: Once | INTRAMUSCULAR | Status: DC | PRN

## 2017-09-08 MED ORDER — PROPOFOL 10 MG/ML IV BOLUS
INTRAVENOUS | Status: AC
  Filled 2017-09-08: qty 20

## 2017-09-08 MED ORDER — ENOXAPARIN SODIUM 40 MG/0.4ML ~~LOC~~ SOLN
40.0000 mg | SUBCUTANEOUS | Status: DC
  Administered 2017-09-09 – 2017-09-12 (×4): 40 mg via SUBCUTANEOUS
  Filled 2017-09-08 (×4): qty 0.4

## 2017-09-08 MED ORDER — SODIUM CHLORIDE 0.9 % IV SOLN
2.0000 g | Freq: Once | INTRAVENOUS | Status: AC
  Administered 2017-09-08: 2 g via INTRAVENOUS
  Filled 2017-09-08 (×2): qty 2

## 2017-09-08 MED ORDER — OXYCODONE HCL 5 MG PO TABS: 5.0000 mg | ORAL_TABLET | ORAL | Status: DC | PRN

## 2017-09-08 MED ORDER — KCL IN DEXTROSE-NACL 20-5-0.45 MEQ/L-%-% IV SOLN
INTRAVENOUS | Status: DC
  Administered 2017-09-09 – 2017-09-12 (×5): via INTRAVENOUS
  Filled 2017-09-08 (×9): qty 1000

## 2017-09-08 MED ORDER — PROPOFOL 10 MG/ML IV BOLUS
INTRAVENOUS | Status: DC | PRN
  Administered 2017-09-08: 150 mg via INTRAVENOUS

## 2017-09-08 MED ORDER — IOPAMIDOL (ISOVUE-300) INJECTION 61%
INTRAVENOUS | Status: AC
  Filled 2017-09-08: qty 100

## 2017-09-08 SURGICAL SUPPLY — 37 items
ADH SKN CLS APL DERMABOND .7 (GAUZE/BANDAGES/DRESSINGS) ×1
ADH SKN CLS LQ APL DERMABOND (GAUZE/BANDAGES/DRESSINGS) ×1
APPLIER CLIP 5 13 M/L LIGAMAX5 (MISCELLANEOUS)
APR CLP MED LRG 5 ANG JAW (MISCELLANEOUS)
BLADE CLIPPER SURG (BLADE) IMPLANT
CANISTER SUCT 3000ML PPV (MISCELLANEOUS) ×2 IMPLANT
CHLORAPREP W/TINT 26ML (MISCELLANEOUS) ×4 IMPLANT
CLIP APPLIE 5 13 M/L LIGAMAX5 (MISCELLANEOUS) IMPLANT
COVER SURGICAL LIGHT HANDLE (MISCELLANEOUS) ×2 IMPLANT
DECANTER SPIKE VIAL GLASS SM (MISCELLANEOUS) ×4 IMPLANT
DERMABOND ADHESIVE PROPEN (GAUZE/BANDAGES/DRESSINGS) ×1
DERMABOND ADVANCED (GAUZE/BANDAGES/DRESSINGS) ×1
DERMABOND ADVANCED .7 DNX12 (GAUZE/BANDAGES/DRESSINGS) ×1 IMPLANT
DERMABOND ADVANCED .7 DNX6 (GAUZE/BANDAGES/DRESSINGS) IMPLANT
DRAPE LAPAROSCOPIC ABDOMINAL (DRAPES) ×2 IMPLANT
ELECT REM PT RETURN 9FT ADLT (ELECTROSURGICAL) ×2
ELECTRODE REM PT RTRN 9FT ADLT (ELECTROSURGICAL) ×1 IMPLANT
GLOVE BIOGEL PI IND STRL 8 (GLOVE) ×1 IMPLANT
GLOVE BIOGEL PI INDICATOR 8 (GLOVE) ×1
GLOVE ECLIPSE 7.5 STRL STRAW (GLOVE) ×2 IMPLANT
GOWN STRL REUS W/ TWL LRG LVL3 (GOWN DISPOSABLE) ×2 IMPLANT
GOWN STRL REUS W/TWL LRG LVL3 (GOWN DISPOSABLE) ×4
KIT BASIN OR (CUSTOM PROCEDURE TRAY) ×2 IMPLANT
KIT TURNOVER KIT B (KITS) ×2 IMPLANT
NS IRRIG 1000ML POUR BTL (IV SOLUTION) ×2 IMPLANT
PAD ARMBOARD 7.5X6 YLW CONV (MISCELLANEOUS) ×4 IMPLANT
SCISSORS LAP 5X35 DISP (ENDOMECHANICALS) ×1 IMPLANT
SET IRRIG TUBING LAPAROSCOPIC (IRRIGATION / IRRIGATOR) IMPLANT
SLEEVE ENDOPATH XCEL 5M (ENDOMECHANICALS) ×2 IMPLANT
SUT MNCRL AB 4-0 PS2 18 (SUTURE) ×3 IMPLANT
TOWEL OR 17X24 6PK STRL BLUE (TOWEL DISPOSABLE) ×2 IMPLANT
TOWEL OR 17X26 10 PK STRL BLUE (TOWEL DISPOSABLE) ×2 IMPLANT
TRAY LAPAROSCOPIC MC (CUSTOM PROCEDURE TRAY) ×2 IMPLANT
TROCAR XCEL BLUNT TIP 100MML (ENDOMECHANICALS) IMPLANT
TROCAR XCEL NON-BLD 11X100MML (ENDOMECHANICALS) ×2 IMPLANT
TROCAR XCEL NON-BLD 5MMX100MML (ENDOMECHANICALS) ×2 IMPLANT
TUBING INSUFFLATION (TUBING) ×2 IMPLANT

## 2017-09-08 NOTE — Op Note (Signed)
Preoperative diagnosis: small bowel obstruction, concern for internal hernia  Postoperative diagnosis: small bowel obstruction, adhesion of omentum causing internal henia  Procedure: diagnostic laparoscopy, laparoscopic lysis of adhesions  Surgeon: Gurney Maxin, M.D.  Asst: none  Anesthesia: general  Indications for procedure: [name] is a 72 y.o. year old female with symptoms of abdominal pain, nausea and vomiting. She was diagnosed with small bowel obstruction. And brought urgently to the operating room due to concern for internal hernia and ischemia.   Description of procedure: The patient was brought into the operative suite. Anesthesia was administered with General endotracheal anesthesia. WHO checklist was applied. The patient was then placed in supine position. The area was prepped and draped in the usual sterile fashion.  Next, a small transverse incision was made in the left subcostal space. A 11mm trocar was used to gain access to the peritoneal cavity by optical entry. Pneumoperitoneum was applied with a high flow and low pressure. Laparoscope was reinserted to confirm placement.  On initial inspection of the abdomen in the right upper quadrant there was a loop of small intestine that appeared to be actually strangulated it was red and angry.  2 additional 5 mm trochars were placed one in the left lower quadrant one in the left mid abdomen.  10 millimeters of core percent Marcaine was infused into the left abdomen for a T AP block.  Next, the terminal ileum was identified and small intestine was run retrograde.  Proximally 2 feet from the ileocecal valve, there was an adhesion creating this loop of intestine to be partially strangulated.  The adhesion was cut with scissors.  This allowed the bowel to be freed.  There was a 15 cm segment of intestine that was red and inflamed.  Approximately 1 foot more proximal to that there was an 8 cm piece of intestine that was red and  inflamed.  The remainder of the small intestine was normal.  Ligament of Treitz was identified.  There is a moderate amount of thin red ascites in the abdomen this was removed.  Liver was inspected previous gallbladder fossa was inspected with evidence of cholecyst ectomy. On reexamination of the 2 pieces of intestine in question, it appeared that they were regaining color and there was no evidence of full-thickness necrosis or perforation.  Therefore I decided not to proceed with any bowel resection at this time.  Pneumoperitoneum was then evacuated.  5 mm trochars were removed.  4-0 Monocryl was used to close the skin in interrupted sub-particular fashion.    Findings: adhesion of omentum causing internal hernia causing severe edema of 2 areas of small bowel, no full thickness necrosis, evidence of previous cholecystectomy       .  Specimen: None  Implant: None  Blood loss: Less than 30 mL  Local anesthesia: 10 ml marcaine   Complications: None  Gurney Maxin, M.D. General, Bariatric, & Minimally Invasive Surgery [facility]

## 2017-09-08 NOTE — H&P (Signed)
[name] is an 72 y.o. female.   Chief Complaint: abdominal pain HPI: 72 yo female with abdominal pain, nausea and vomiting. Pain is worst on the right side. She has never had a pain like this. The pain started at 4am this morning. She has vomited multiple times. She had a bowel movements earlier this morning. She denies fevers.  Past Medical History:  Diagnosis Date  . Hypertension     Past Surgical History:  Procedure Laterality Date  . REPLACEMENT TOTAL KNEE Left     Family History  Problem Relation Age of Onset  . Cancer Mother        STOMACH  . Heart disease Father   . Diabetes Sister   . Diabetes Brother    Social History:  reports that she has never smoked. She has never used smokeless tobacco. She reports that she does not drink alcohol or use drugs.  Allergies: No Known Allergies   (Not in a hospital admission)  Results for orders placed or performed during the hospital encounter of 09/08/17 (from the past 48 hour(s))  Lipase, blood     Status: None   Collection Time: 09/08/17  7:58 AM  Result Value Ref Range   Lipase 31 11 - 51 U/L    Comment: Performed at [facility], [address] [place], [place] [address]  Comprehensive metabolic panel     Status: Abnormal   Collection Time: 09/08/17  7:58 AM  Result Value Ref Range   Sodium 139 135 - 145 mmol/L   Potassium 3.4 (L) 3.5 - 5.1 mmol/L   Chloride 100 (L) 101 - 111 mmol/L   CO2 25 22 - 32 mmol/L   Glucose, Bld 128 (H) 65 - 99 mg/dL   BUN 11 6 - 20 mg/dL   Creatinine, Ser 0.78 0.44 - 1.00 mg/dL   Calcium 9.6 8.9 - 10.3 mg/dL   Total Protein 8.2 (H) 6.5 - 8.1 g/dL   Albumin 4.0 3.5 - 5.0 g/dL   AST 36 15 - 41 U/L   ALT 25 14 - 54 U/L   Alkaline Phosphatase 59 38 - 126 U/L   Total Bilirubin 1.4 (H) 0.3 - 1.2 mg/dL   GFR calc non Af Amer >60 >60 mL/min   GFR calc Af Amer >60 >60 mL/min    Comment: (NOTE) The eGFR has been calculated using the CKD EPI equation. This calculation has not  been validated in all clinical situations. eGFR's persistently <60 mL/min signify possible Chronic Kidney Disease.    Anion gap 14 5 - 15    Comment: Performed at [facility] [address] [place] [address]  CBC     Status: None   Collection Time: 09/08/17  7:58 AM  Result Value Ref Range   WBC 5.5 4.0 - 10.5 K/uL   RBC 4.44 3.87 - 5.11 MIL/uL   Hemoglobin 13.8 12.0 - 15.0 g/dL   HCT 39.6 36.0 - 46.0 %   MCV 89.2 78.0 - 100.0 fL   MCH 31.1 26.0 - 34.0 pg   MCHC 34.8 30.0 - 36.0 g/dL   RDW 14.6 11.5 - 15.5 %   Platelets 180 150 - 400 K/uL    Comment: Performed at [facility], [place] [address] [place], [place] [address]  Urinalysis, Routine w reflex microscopic     Status: Abnormal   Collection Time: 09/08/17 10:17 AM  Result Value Ref Range   Color, Urine YELLOW YELLOW  APPearance CLEAR CLEAR   Specific Gravity, Urine 1.010 1.005 - 1.030   pH 9.0 (H) 5.0 - 8.0   Glucose, UA NEGATIVE NEGATIVE mg/dL   Hgb urine dipstick NEGATIVE NEGATIVE   Bilirubin Urine NEGATIVE NEGATIVE   Ketones, ur 5 (A) NEGATIVE mg/dL   Protein, ur NEGATIVE NEGATIVE mg/dL   Nitrite NEGATIVE NEGATIVE   Leukocytes, UA NEGATIVE NEGATIVE    Comment: Performed at [facility] [address], [place], [place] [address]   Ct Abdomen Pelvis W Contrast  Result Date: 09/08/2017 CLINICAL DATA:  Patient c/o upper abdominal pain since 2am with nausea and vomiting. Patient states hx of gallstones, hx of "stomach problems" which he is unable to name. Patient denies changes in bowel or bladder. EXAM: CT ABDOMEN AND PELVIS WITH CONTRAST TECHNIQUE: Multidetector CT imaging of the abdomen and pelvis was performed using the standard protocol following bolus administration of intravenous contrast. CONTRAST:  170m ISOVUE-300 IOPAMIDOL (ISOVUE-300) INJECTION 61% COMPARISON:  Ultrasound the abdomen 09/08/2017 FINDINGS: Lower chest: There is bibasilar atelectasis. Heart size is normal. No pericardial  effusion or significant coronary artery calcifications. Hepatobiliary: There is perihepatic fluid. Status post cholecystectomy. Mild dilatation of the intra and extrahepatic bile ducts. No focal liver lesions. Pancreas: Unremarkable. No pancreatic ductal dilatation or surrounding inflammatory changes. Spleen: Normal in size without focal abnormality. Adrenals/Urinary Tract: Adrenal glands are normal. There is a 3.1 centimeter parapelvic cyst in LOWER pole region of the LEFT kidney. There is no hydronephrosis for urinary tract obstruction. Urinary bladder is normal in appearance. Stomach/Bowel: The stomach has a normal appearance. There is dilatation and thickening of the wall of small bowel within the RIGHT UPPER QUADRANT. Within this region there is a point of tethering, best seen on coronal image number 72 of series 5, consistent with internal hernia. Mesenteric stranding is also associated with these bowel changes in the RIGHT UPPER QUADRANT. The terminal ileum is normal in appearance. There are scattered colonic diverticula but no acute diverticulitis. Vascular/Lymphatic: No significant vascular findings are present. No enlarged abdominal or pelvic lymph nodes. Reproductive: The uterus is present and contains a calcified fibroid. No adnexal mass. Other: Small amount of free pelvic fluid. The anterior abdominal wall is unremarkable. Musculoskeletal: Degenerative changes are seen in the LOWER thoracic and lumbar spine. No suspicious lytic or blastic lesions are identified. IMPRESSION: 1. Abnormal small bowel loops within the RIGHT UPPER QUADRANT, consisting of bowel wall thickening, bowel dilatation, and mesenteric stranding and. Perihepatic fluid. Given the abrupt transition zone for 2 adjacent loops, I suspect that this represents an internal hernia. 2. No free intraperitoneal air or abscess. 3. Bibasilar atelectasis. 4. Cholecystectomy. 5. LEFT renal cyst. 6. Uterine fibroid. 7. Thoracic and lumbar spondylosis.  These results were called by telephone at the time of interpretation on 09/08/2017 at 12:39 pm to Dr. PAddison Lank, who verbally acknowledged these results. Electronically Signed   By: ENolon NationsM.D.   On: 09/08/2017 12:39   UKoreaAbdomen Limited Ruq  Result Date: 09/08/2017 CLINICAL DATA:  RIGHT UPPER QUADRANT pain since 4 a.m. EXAM: ULTRASOUND ABDOMEN LIMITED RIGHT UPPER QUADRANT COMPARISON:  None. FINDINGS: Gallbladder: The gallbladder is not visualized and may be surgically absent. Common bile duct: Diameter: 11 millimeters-13 millimeters. Liver: There is mild intrahepatic biliary duct dilatation. No focal liver lesion. Normal parenchymal echogenicity. Portal vein is patent on color Doppler imaging with normal direction of blood flow towards the liver. Additional: There is a small amount of perihepatic fluid. IMPRESSION: 1. Intra  and extrahepatic biliary duct dilatation. Gallbladder not seen. Suspect previous cholecystectomy. 2. Small amount of perihepatic fluid. Consider CT of the abdomen and pelvis for further evaluation. Electronically Signed   By: Nolon Nations M.D.   On: 09/08/2017 11:46    Review of Systems  Constitutional: Negative for chills and fever.  HENT: Negative for hearing loss.   Eyes: Negative for blurred vision and double vision.  Respiratory: Negative for cough and hemoptysis.   Cardiovascular: Negative for chest pain and palpitations.  Gastrointestinal: Positive for abdominal pain, nausea and vomiting.  Genitourinary: Negative for dysuria and urgency.  Musculoskeletal: Negative for myalgias and neck pain.  Skin: Negative for itching and rash.  Neurological: Negative for dizziness, tingling and headaches.  Endo/Heme/Allergies: Does not bruise/bleed easily.  Psychiatric/Behavioral: Negative for depression and suicidal ideas.    Blood pressure (!) 148/93, pulse 86, temperature 98.1 F (36.7 C), temperature source Oral, resp. rate (!) 22, height '5\' 4"'$  (1.626 m),  weight 73.5 kg (162 lb), SpO2 98 %. Physical Exam  Vitals reviewed. Constitutional: She is oriented to person, place, and time. She appears well-developed and well-nourished.  HENT:  Head: Normocephalic and atraumatic.  Eyes: Pupils are equal, round, and reactive to light. Conjunctivae and EOM are normal.  Neck: Normal range of motion. Neck supple.  Cardiovascular: Normal rate and regular rhythm.  Respiratory: Effort normal and breath sounds normal.  GI: Soft. Bowel sounds are normal. She exhibits no distension. There is tenderness in the right upper quadrant and right lower quadrant.  Musculoskeletal: Normal range of motion.  Neurological: She is alert and oriented to person, place, and time.  Skin: Skin is warm and dry.  Psychiatric: She has a normal mood and affect. Her behavior is normal.     Assessment/Plan 72 yo female with first small bowel obstruction. She has concerning features on CT of mesenteric stranding, free fluid near the transition and focal kink concerning for internal hernia. She had minimal drainage on initial NG tube placement. Due to findings and hern exam I think the safest way to proceed is for diagnostic laparoscopy for possible internal hernia today. If there is necrotic intestine more complex pathology she may require bowel resection or open procedure -iv abx -npo -OR for diagnostic laparoscopy possible open procedure possible bowel resection -admit to surgery inpatient  Mickeal Skinner, MD 09/08/2017, 1:56 PM

## 2017-09-08 NOTE — Anesthesia Preprocedure Evaluation (Signed)
Anesthesia Evaluation  Patient identified by MRN, date of birth, ID band Patient awake    Reviewed: Allergy & Precautions, NPO status , Patient's Chart, lab work & pertinent test results  Airway Mallampati: II  TM Distance: >3 FB Neck ROM: Full    Dental  (+) Teeth Intact, Dental Advisory Given   Pulmonary    breath sounds clear to auscultation       Cardiovascular hypertension,  Rhythm:Regular Rate:Normal     Neuro/Psych    GI/Hepatic   Endo/Other    Renal/GU      Musculoskeletal   Abdominal   Peds  Hematology   Anesthesia Other Findings   Reproductive/Obstetrics                            Anesthesia Physical Anesthesia Plan  ASA: III and emergent  Anesthesia Plan: General   Post-op Pain Management:    Induction: Intravenous, Rapid sequence and Cricoid pressure planned  PONV Risk Score and Plan: Ondansetron and Dexamethasone  Airway Management Planned: Oral ETT  Additional Equipment:   Intra-op Plan:   Post-operative Plan: Extubation in OR  Informed Consent: I have reviewed the patients History and Physical, chart, labs and discussed the procedure including the risks, benefits and alternatives for the proposed anesthesia with the patient or authorized representative who has indicated his/her understanding and acceptance.     Dental advisory given  Plan Discussed with: CRNA and Anesthesiologist  Anesthesia Plan Comments:        Anesthesia Quick Evaluation  

## 2017-09-08 NOTE — Transfer of Care (Signed)
Immediate Anesthesia Transfer of Care Note  Patient: [name]  Procedure(s) Performed: LYSIS OF ADHESIONS LAPAROSCOPIC (N/A Abdomen)  Patient Location: PACU  Anesthesia Type:General  Level of Consciousness: awake, alert , oriented and patient cooperative  Airway & Oxygen Therapy: Patient Spontanous Breathing  Post-op Assessment: Report given to RN and Post -op Vital signs reviewed and stable  Post vital signs: Reviewed  Last Vitals: 151/88, 95, 19, 100% RA Vitals Value Taken Time  BP 151/88 09/08/2017  3:54 PM  Temp    Pulse 95 09/08/2017  3:56 PM  Resp 20 09/08/2017  3:56 PM  SpO2 100 % 09/08/2017  3:56 PM  Vitals shown include unvalidated device data.  Last Pain:  Vitals:   09/08/17 0828  TempSrc:   PainSc: 8          Complications: No apparent anesthesia complications

## 2017-09-08 NOTE — ED Notes (Signed)
Patient transported to CT 

## 2017-09-08 NOTE — Anesthesia Postprocedure Evaluation (Signed)
Anesthesia Post Note  Patient: [name]  Procedure(s) Performed: LYSIS OF ADHESIONS LAPAROSCOPIC (N/A Abdomen)     Patient location during evaluation: PACU Anesthesia Type: General Level of consciousness: awake and alert Pain management: pain level controlled Vital Signs Assessment: post-procedure vital signs reviewed and stable Respiratory status: spontaneous breathing, nonlabored ventilation, respiratory function stable and patient connected to nasal cannula oxygen Cardiovascular status: blood pressure returned to baseline and stable Postop Assessment: no apparent nausea or vomiting Anesthetic complications: no    Last Vitals:  Vitals:   09/08/17 1554 09/08/17 1608  BP: (!) 151/88 (!) 153/93  Pulse: 93 94  Resp: 18 18  Temp: 36.5 C   SpO2: 99% 97%    Last Pain:  Vitals:   09/08/17 1554  TempSrc:   PainSc: 0-No pain                 Sheria Rosello COKER

## 2017-09-08 NOTE — Progress Notes (Signed)
UA collected. Sent to lab

## 2017-09-08 NOTE — ED Triage Notes (Signed)
Patient presents to ED for assessment of upper abdominal pain since 2am with nausea and vomiting.  Patient states hx of gallstones, hx of "stomach problems" which patient is unable to name.  Patient denies changes in bowel or bladder.  Patient actively vomiting at arrival.

## 2017-09-08 NOTE — ED Provider Notes (Signed)
[facility] Provider Note  CSN: [id number] Arrival date & time: 09/08/17 8676  Chief Complaint(s) Abdominal Pain  HPI [name] is a 72 y.o. female   The history is provided by the patient.  Abdominal Pain   This is a new problem. The current episode started 1 to 2 hours ago. The problem occurs constantly. The problem has not changed since onset.The pain is associated with an unknown factor. The pain is located in the epigastric region and RUQ. The quality of the pain is aching. The pain is moderate. Associated symptoms include nausea and vomiting (bilious. nonbloody). Pertinent negatives include fever, diarrhea, constipation and dysuria. The symptoms are aggravated by palpation. Nothing relieves the symptoms. Her past medical history is significant for gallstones. Her past medical history does not include ulcerative colitis.    Past Medical History Past Medical History:  Diagnosis Date  . Hypertension    Patient Active Problem List   Diagnosis Date Noted  . Internal hernia 09/08/2017  . ESSENTIAL HYPERTENSION, BENIGN 07/11/2009   Home Medication(s) Prior to Admission medications   Medication Sig Start Date End Date Taking? Authorizing Provider  aspirin 81 MG tablet Take 81 mg by mouth daily.   Yes [provider]  calcium carbonate (OS-CAL) 600 MG TABS tablet Take 600 mg by mouth daily.    Yes [provider]  Cinnamon 500 MG capsule Take 500 mg by mouth daily.   Yes [provider]  Cod Liver Oil 1000 MG CAPS Take 1 capsule by mouth daily. During the winter months.   Yes [provider]  Cyanocobalamin (VITAMIN B 12 PO) Take 1 tablet by mouth daily.    Yes [provider]  losartan-hydrochlorothiazide (HYZAAR) 100-12.5 MG tablet Take 1 tablet by mouth daily.   Yes [provider]  Multiple Vitamins-Minerals (MULTIVITAMIN WITH MINERALS) tablet Take 1 tablet by mouth daily.   Yes [provider]  Omega-3 Fatty Acids (FISH OIL) 1000 MG CAPS Take by mouth. Reported on 12/05/2015   Yes [provider]  vitamin E 400 UNIT capsule Take 400 Units by mouth daily.   Yes [provider]  carvedilol (COREG) 25 MG tablet Take 25 mg by mouth 2 (two) times daily with a meal. Reported on 12/05/2015    [provider]  Estradiol 10 MCG TABS vaginal tablet Place 1 tablet (10 mcg total) vaginally 2 (two) times a week. Patient not taking: Reported on 09/08/2017 12/05/15   Terrance Mass, MD                                                                                                                                    Past Surgical History Past Surgical History:  Procedure Laterality Date  . REPLACEMENT TOTAL KNEE Left    Family History Family History  Problem Relation Age of Onset  . Cancer Mother  STOMACH  . Heart disease Father   . Diabetes Sister   . Diabetes Brother     Social History Social History   Tobacco Use  . Smoking status: Never Smoker  . Smokeless tobacco: Never Used  Substance Use Topics  . Alcohol use: No    Alcohol/week: 0.0 oz    Comment: occasional  . Drug use: No   Allergies Patient has no known allergies.  Review of Systems Review of Systems  Constitutional: Negative for fever.  Gastrointestinal: Positive for abdominal pain, nausea and vomiting (bilious. nonbloody). Negative for constipation and diarrhea.  Genitourinary: Negative for dysuria.   All other systems are reviewed and are negative for acute change except as noted in the HPI  Physical Exam Vital Signs  I have reviewed the triage vital signs BP (!) 180/96 (BP Location: Right Arm)   Pulse 78   Temp 98.1 F (36.7 C) (Oral)   Resp 20   Ht 5\' 4"  (1.626 m)   Wt 73.5 kg (162 lb)   SpO2 100%   BMI 27.81 kg/m   Physical Exam  Constitutional: She is oriented to person, place, and time. She appears well-developed and well-nourished. No distress.    HENT:  Head: Normocephalic and atraumatic.  Right Ear: External ear normal.  Left Ear: External ear normal.  Nose: Nose normal.  Eyes: Conjunctivae and EOM are normal. No scleral icterus.  Neck: Normal range of motion and phonation normal.  Cardiovascular: Normal rate and regular rhythm.  Pulmonary/Chest: Effort normal. No stridor. No respiratory distress.  Abdominal: She exhibits no distension. There is tenderness in the right upper quadrant and epigastric area. There is positive Murphy's sign. There is no rigidity, no rebound and no guarding.  Musculoskeletal: Normal range of motion. She exhibits no edema.  Neurological: She is alert and oriented to person, place, and time.  Skin: She is not diaphoretic.  Psychiatric: She has a normal mood and affect. Her behavior is normal.  Vitals reviewed.   ED Results and Treatments Labs (all labs ordered are listed, but only abnormal results are displayed) Labs Reviewed  COMPREHENSIVE METABOLIC PANEL - Abnormal; Notable for the following components:      Result Value   Potassium 3.4 (*)    Chloride 100 (*)    Glucose, Bld 128 (*)    Total Protein 8.2 (*)    Total Bilirubin 1.4 (*)    All other components within normal limits  URINALYSIS, ROUTINE W REFLEX MICROSCOPIC - Abnormal; Notable for the following components:   pH 9.0 (*)    Ketones, ur 5 (*)    All other components within normal limits  LIPASE, BLOOD  CBC  COMPREHENSIVE METABOLIC PANEL  CBC                                                                                                                         EKG  EKG Interpretation  Date/Time:  Sunday September 08 2017 07:56:44 EDT Ventricular Rate:  74 PR Interval:  134 QRS Duration: 78 QT Interval:  386 QTC Calculation: 428 R Axis:   -36 Text Interpretation:  Normal sinus rhythm Biatrial enlargement Left axis deviation Pulmonary disease pattern ST & T wave abnormality, consider inferior ischemia Abnormal ECG Poor data  quality Confirmed by Addison Lank [id number] on 09/08/2017 9:17:54 AM      Radiology Ct Abdomen Pelvis W Contrast  Result Date: 09/08/2017 CLINICAL DATA:  Patient c/o upper abdominal pain since 2am with nausea and vomiting. Patient states hx of gallstones, hx of "stomach problems" which he is unable to name. Patient denies changes in bowel or bladder. EXAM: CT ABDOMEN AND PELVIS WITH CONTRAST TECHNIQUE: Multidetector CT imaging of the abdomen and pelvis was performed using the standard protocol following bolus administration of intravenous contrast. CONTRAST:  168mL ISOVUE-300 IOPAMIDOL (ISOVUE-300) INJECTION 61% COMPARISON:  Ultrasound the abdomen 09/08/2017 FINDINGS: Lower chest: There is bibasilar atelectasis. Heart size is normal. No pericardial effusion or significant coronary artery calcifications. Hepatobiliary: There is perihepatic fluid. Status post cholecystectomy. Mild dilatation of the intra and extrahepatic bile ducts. No focal liver lesions. Pancreas: Unremarkable. No pancreatic ductal dilatation or surrounding inflammatory changes. Spleen: Normal in size without focal abnormality. Adrenals/Urinary Tract: Adrenal glands are normal. There is a 3.1 centimeter parapelvic cyst in LOWER pole region of the LEFT kidney. There is no hydronephrosis for urinary tract obstruction. Urinary bladder is normal in appearance. Stomach/Bowel: The stomach has a normal appearance. There is dilatation and thickening of the wall of small bowel within the RIGHT UPPER QUADRANT. Within this region there is a point of tethering, best seen on coronal image number 72 of series 5, consistent with internal hernia. Mesenteric stranding is also associated with these bowel changes in the RIGHT UPPER QUADRANT. The terminal ileum is normal in appearance. There are scattered colonic diverticula but no acute diverticulitis. Vascular/Lymphatic: No significant vascular findings are present. No enlarged abdominal or pelvic lymph nodes.  Reproductive: The uterus is present and contains a calcified fibroid. No adnexal mass. Other: Small amount of free pelvic fluid. The anterior abdominal wall is unremarkable. Musculoskeletal: Degenerative changes are seen in the LOWER thoracic and lumbar spine. No suspicious lytic or blastic lesions are identified. IMPRESSION: 1. Abnormal small bowel loops within the RIGHT UPPER QUADRANT, consisting of bowel wall thickening, bowel dilatation, and mesenteric stranding and. Perihepatic fluid. Given the abrupt transition zone for 2 adjacent loops, I suspect that this represents an internal hernia. 2. No free intraperitoneal air or abscess. 3. Bibasilar atelectasis. 4. Cholecystectomy. 5. LEFT renal cyst. 6. Uterine fibroid. 7. Thoracic and lumbar spondylosis. These results were called by telephone at the time of interpretation on 09/08/2017 at 12:39 pm to Dr. Addison Lank , who verbally acknowledged these results. Electronically Signed   By: Nolon Nations M.D.   On: 09/08/2017 12:39   Dg Abd Portable 1 View  Result Date: 09/08/2017 CLINICAL DATA:  Nasogastric tube placement. EXAM: PORTABLE ABDOMEN - 1 VIEW COMPARISON:  09/08/2017 FINDINGS: Interval placement of nasogastric tube, tip overlying the level of the gastric fundus. There is contrast in nondilated collecting systems of the kidneys bilaterally. Bowel gas pattern is nonobstructive. IMPRESSION: Interval placement of nasogastric tube. Electronically Signed   By: Nolon Nations M.D.   On: 09/08/2017 14:14   US Abdomen Limited Ruq  Result Date: 09/08/2017 CLINICAL DATA:  RIGHT UPPER QUADRANT pain since 4 a.m. EXAM: ULTRASOUND ABDOMEN LIMITED RIGHT UPPER QUADRANT COMPARISON:  None. FINDINGS: Gallbladder: The gallbladder is not visualized and may be  surgically absent. Common bile duct: Diameter: 11 millimeters-13 millimeters. Liver: There is mild intrahepatic biliary duct dilatation. No focal liver lesion. Normal parenchymal echogenicity. Portal vein is  patent on color Doppler imaging with normal direction of blood flow towards the liver. Additional: There is a small amount of perihepatic fluid. IMPRESSION: 1. Intra and extrahepatic biliary duct dilatation. Gallbladder not seen. Suspect previous cholecystectomy. 2. Small amount of perihepatic fluid. Consider CT of the abdomen and pelvis for further evaluation. Electronically Signed   By: Nolon Nations M.D.   On: 09/08/2017 11:46   Pertinent labs & imaging results that were available during my care of the patient were reviewed by me and considered in my medical decision making (see chart for details).  Medications Ordered in ED Medications  iopamidol (ISOVUE-300) 61 % injection (has no administration in time range)  enoxaparin (LOVENOX) injection 40 mg (has no administration in time range)  dextrose 5 % and 0.45 % NaCl with KCl 20 mEq/L infusion (has no administration in time range)  acetaminophen (TYLENOL) tablet 650 mg (has no administration in time range)    Or  acetaminophen (TYLENOL) suppository 650 mg (has no administration in time range)  oxyCODONE (Oxy IR/ROXICODONE) immediate release tablet 5 mg (has no administration in time range)  morphine 2 MG/ML injection 2 mg (has no administration in time range)  ondansetron (ZOFRAN-ODT) disintegrating tablet 4 mg (has no administration in time range)    Or  ondansetron (ZOFRAN) injection 4 mg (has no administration in time range)  metoprolol tartrate (LOPRESSOR) injection 5 mg (has no administration in time range)  diphenhydrAMINE (BENADRYL) 12.5 MG/5ML elixir 12.5 mg (has no administration in time range)    Or  diphenhydrAMINE (BENADRYL) injection 12.5 mg (has no administration in time range)  polyethylene glycol (MIRALAX / GLYCOLAX) packet 17 g (has no administration in time range)  ondansetron (ZOFRAN-ODT) disintegrating tablet 4 mg (4 mg Oral Given by Other 09/08/17 0750)  sodium chloride 0.9 % bolus 1,000 mL (0 mLs Intravenous Stopped  09/08/17 1017)  ondansetron (ZOFRAN) injection 4 mg (4 mg Intravenous Given 09/08/17 0917)  fentaNYL (SUBLIMAZE) injection 50 mcg (50 mcg Intravenous Given 09/08/17 1227)  iopamidol (ISOVUE-300) 61 % injection 100 mL (100 mLs Intravenous Contrast Given 09/08/17 1137)  cefOXitin (MEFOXIN) 2 g in sodium chloride 0.9 % 100 mL IVPB ( Intravenous MAR Unhold 09/08/17 1833)                                                                                                                                    Procedures Procedures  EMERGENCY DEPARTMENT BILIARY ULTRASOUND INTERPRETATION "Study: Limited Abdominal Ultrasound of the Gallbladder and Common Bile Duct."  INDICATIONS: Abdominal pain and Nausea and vomiting Indication: Multiple views of the gallbladder and common bile duct were obtained in real-time with a Multi-frequency probe."  PERFORMED BY:  Myself IMAGES ARCHIVED?: Yes LIMITATIONS: Bowel gas and Abdominal pain INTERPRETATION: Gallbladder not visualized.  dilated hepatic biliary branching   (including critical care time)  Medical Decision Making / ED Course I have reviewed the nursing notes for this encounter and the patient's prior records (if available in EHR or on provided paperwork).    Initially suspicious for billiary disease vs PUD vs pancreatitis. Labs reassuring w/o leukocytosis, biliary obstruction or pancreatitis. POCUS unable to visualize the GB. Pt was unsure of whether she had it removed.  Provided with pain medicine, antiemetics, and IVF.  Given her continued pain and tenderness a formal US was obtained which confirmed likely cholecystectomy. CT ordered which revealed evidence of SBO from possible internal hernia. NGT inserted.  Case discussed with Dr. Kieth Brightly, who evaluated the patient in the ED and took patient to the OR for operative repair.    Final Clinical Impression(s) / ED Diagnoses Final diagnoses:  RUQ pain  SBO (small bowel obstruction) ([place])      This  chart was dictated using voice recognition software.  Despite best efforts to proofread,  errors can occur which can change the documentation meaning.   Fatima Blank, MD 09/08/17 1919

## 2017-09-08 NOTE — Progress Notes (Signed)
[name] is a 72 y.o. female patient admitted from ED awake, alert - oriented  X 4 - no acute distress noted.  VSS - Blood pressure (!) 142/92, pulse (!) 103, temperature 98.7 F (37.1 C), temperature source Oral, resp. rate 20, height 5\' 4"  (1.626 m), weight 73.5 kg (162 lb), SpO2 97 %.    IV in place, occlusive dsg intact without redness.  Orientation to room, and floor completed with information packet given to patient/family.  Patient declined safety video at this time.  Admission INP armband ID verified with patient/family, and in place.   SR up x 2, fall assessment complete, with patient and family able to verbalize understanding of risk associated with falls, and verbalized understanding to call nsg before up out of bed.  Call light within reach, patient able to voice, and demonstrate understanding.  Skin, clean-dry- intact without evidence of bruising, or skin tears.   No evidence of skin break down noted on exam.     Will cont to eval and treat per MD orders.  Luci Bank, RN 09/08/2017 6:57 PM

## 2017-09-08 NOTE — Anesthesia Procedure Notes (Signed)
Procedure Name: Intubation Date/Time: 09/08/2017 2:48 PM Performed by: Sammie Bench, CRNA Pre-anesthesia Checklist: Patient identified, Emergency Drugs available, Suction available and Patient being monitored Patient Re-evaluated:Patient Re-evaluated prior to induction Oxygen Delivery Method: Circle System Utilized Preoxygenation: Pre-oxygenation with 100% oxygen Induction Type: IV induction Ventilation: Mask ventilation without difficulty Laryngoscope Size: Mac and 3 Grade View: Grade I Tube type: Oral Tube size: 7.0 mm Number of attempts: 1 Airway Equipment and Method: Stylet and Oral airway Placement Confirmation: ETT inserted through vocal cords under direct vision,  positive ETCO2 and breath sounds checked- equal and bilateral Secured at: 22 cm Tube secured with: Tape Dental Injury: Teeth and Oropharynx as per pre-operative assessment

## 2017-09-09 ENCOUNTER — Encounter (HOSPITAL_COMMUNITY): Payer: Self-pay | Admitting: General Surgery

## 2017-09-09 LAB — COMPREHENSIVE METABOLIC PANEL
ALT: 19 U/L (ref 14–54)
AST: 27 U/L (ref 15–41)
Albumin: 3.1 g/dL — ABNORMAL LOW (ref 3.5–5.0)
Alkaline Phosphatase: 41 U/L (ref 38–126)
Anion gap: 9 (ref 5–15)
BUN: 16 mg/dL (ref 6–20)
CO2: 27 mmol/L (ref 22–32)
Calcium: 8.3 mg/dL — ABNORMAL LOW (ref 8.9–10.3)
Chloride: 102 mmol/L (ref 101–111)
Creatinine, Ser: 0.99 mg/dL (ref 0.44–1.00)
GFR calc Af Amer: >60 mL/min (ref >60)
GFR calc non Af Amer: 56 mL/min — ABNORMAL LOW (ref >60)
Glucose, Bld: 125 mg/dL — ABNORMAL HIGH (ref 65–99)
Potassium: 3.2 mmol/L — ABNORMAL LOW (ref 3.5–5.1)
Sodium: 138 mmol/L (ref 135–145)
Total Bilirubin: 1.1 mg/dL (ref 0.3–1.2)
Total Protein: 6.3 g/dL — ABNORMAL LOW (ref 6.5–8.1)

## 2017-09-09 LAB — CBC
HCT: 35.5 % — ABNORMAL LOW (ref 36.0–46.0)
Hemoglobin: 12.4 g/dL (ref 12.0–15.0)
MCH: 31.2 pg (ref 26.0–34.0)
MCHC: 34.9 g/dL (ref 30.0–36.0)
MCV: 89.4 fL (ref 78.0–100.0)
Platelets: 160 10*3/uL (ref 150–400)
RBC: 3.97 MIL/uL (ref 3.87–5.11)
RDW: 15.3 % (ref 11.5–15.5)
WBC: 9.3 10*3/uL (ref 4.0–10.5)

## 2017-09-09 MED ORDER — POTASSIUM CHLORIDE 10 MEQ/100ML IV SOLN
10.0000 meq | INTRAVENOUS | Status: AC
  Administered 2017-09-09 (×4): 10 meq via INTRAVENOUS
  Filled 2017-09-09 (×4): qty 100

## 2017-09-09 NOTE — Progress Notes (Signed)
1 Day Post-Op   Subjective/Chief Complaint: A couple episodes of flatus. Abdomen feels much better.    Objective: Vital signs in last 24 hours: Temp:  [97.7 F (36.5 C)-98.7 F (37.1 C)] 98.2 F (36.8 C) (04/22 0845) Pulse Rate:  [68-105] 77 (04/22 0845) Resp:  [15-29] 20 (04/22 0845) BP: (118-170)/(72-100) 125/74 (04/22 0845) SpO2:  [96 %-99 %] 97 % (04/22 0845) Last BM Date: 09/08/17  Intake/Output from previous day: 04/21 0701 - 04/22 0700 In: 2000 [I.V.:1000; IV Piggyback:1000] Out: 210 [Urine:200; Blood:10] Intake/Output this shift: No intake/output data recorded.  General appearance: alert and cooperative Resp: clear to auscultation bilaterally GI: soft, nondistended, mildly appropriately tender left hemiabdomen Skin: Skin color, texture, turgor normal. No rashes or lesions  Lab Results:  Recent Labs    09/08/17 0758 09/09/17 0504  WBC 5.5 9.3  HGB 13.8 12.4  HCT 39.6 35.5*  PLT 180 160   BMET Recent Labs    09/08/17 0758 09/09/17 0504  NA 139 138  K 3.4* 3.2*  CL 100* 102  CO2 25 27  GLUCOSE 128* 125*  BUN 11 16  CREATININE 0.78 0.99  CALCIUM 9.6 8.3*   PT/INR No results for input(s): LABPROT, INR in the last 72 hours. ABG No results for input(s): PHART, HCO3 in the last 72 hours.  Invalid input(s): PCO2, PO2  Studies/Results: Ct Abdomen Pelvis W Contrast  Result Date: 09/08/2017 CLINICAL DATA:  Patient c/o upper abdominal pain since 2am with nausea and vomiting. Patient states hx of gallstones, hx of "stomach problems" which he is unable to name. Patient denies changes in bowel or bladder. EXAM: CT ABDOMEN AND PELVIS WITH CONTRAST TECHNIQUE: Multidetector CT imaging of the abdomen and pelvis was performed using the standard protocol following bolus administration of intravenous contrast. CONTRAST:  133mL ISOVUE-300 IOPAMIDOL (ISOVUE-300) INJECTION 61% COMPARISON:  Ultrasound the abdomen 09/08/2017 FINDINGS: Lower chest: There is bibasilar  atelectasis. Heart size is normal. No pericardial effusion or significant coronary artery calcifications. Hepatobiliary: There is perihepatic fluid. Status post cholecystectomy. Mild dilatation of the intra and extrahepatic bile ducts. No focal liver lesions. Pancreas: Unremarkable. No pancreatic ductal dilatation or surrounding inflammatory changes. Spleen: Normal in size without focal abnormality. Adrenals/Urinary Tract: Adrenal glands are normal. There is a 3.1 centimeter parapelvic cyst in LOWER pole region of the LEFT kidney. There is no hydronephrosis for urinary tract obstruction. Urinary bladder is normal in appearance. Stomach/Bowel: The stomach has a normal appearance. There is dilatation and thickening of the wall of small bowel within the RIGHT UPPER QUADRANT. Within this region there is a point of tethering, best seen on coronal image number 72 of series 5, consistent with internal hernia. Mesenteric stranding is also associated with these bowel changes in the RIGHT UPPER QUADRANT. The terminal ileum is normal in appearance. There are scattered colonic diverticula but no acute diverticulitis. Vascular/Lymphatic: No significant vascular findings are present. No enlarged abdominal or pelvic lymph nodes. Reproductive: The uterus is present and contains a calcified fibroid. No adnexal mass. Other: Small amount of free pelvic fluid. The anterior abdominal wall is unremarkable. Musculoskeletal: Degenerative changes are seen in the LOWER thoracic and lumbar spine. No suspicious lytic or blastic lesions are identified. IMPRESSION: 1. Abnormal small bowel loops within the RIGHT UPPER QUADRANT, consisting of bowel wall thickening, bowel dilatation, and mesenteric stranding and. Perihepatic fluid. Given the abrupt transition zone for 2 adjacent loops, I suspect that this represents an internal hernia. 2. No free intraperitoneal air or abscess. 3. Bibasilar atelectasis.  4. Cholecystectomy. 5. LEFT renal cyst. 6.  Uterine fibroid. 7. Thoracic and lumbar spondylosis. These results were called by telephone at the time of interpretation on 09/08/2017 at 12:39 pm to Dr. Addison Lank , who verbally acknowledged these results. Electronically Signed   By: Nolon Nations M.D.   On: 09/08/2017 12:39   Dg Abd Portable 1 View  Result Date: 09/08/2017 CLINICAL DATA:  Nasogastric tube placement. EXAM: PORTABLE ABDOMEN - 1 VIEW COMPARISON:  09/08/2017 FINDINGS: Interval placement of nasogastric tube, tip overlying the level of the gastric fundus. There is contrast in nondilated collecting systems of the kidneys bilaterally. Bowel gas pattern is nonobstructive. IMPRESSION: Interval placement of nasogastric tube. Electronically Signed   By: Nolon Nations M.D.   On: 09/08/2017 14:14   US Abdomen Limited Ruq  Result Date: 09/08/2017 CLINICAL DATA:  RIGHT UPPER QUADRANT pain since 4 a.m. EXAM: ULTRASOUND ABDOMEN LIMITED RIGHT UPPER QUADRANT COMPARISON:  None. FINDINGS: Gallbladder: The gallbladder is not visualized and may be surgically absent. Common bile duct: Diameter: 11 millimeters-13 millimeters. Liver: There is mild intrahepatic biliary duct dilatation. No focal liver lesion. Normal parenchymal echogenicity. Portal vein is patent on color Doppler imaging with normal direction of blood flow towards the liver. Additional: There is a small amount of perihepatic fluid. IMPRESSION: 1. Intra and extrahepatic biliary duct dilatation. Gallbladder not seen. Suspect previous cholecystectomy. 2. Small amount of perihepatic fluid. Consider CT of the abdomen and pelvis for further evaluation. Electronically Signed   By: Nolon Nations M.D.   On: 09/08/2017 11:46    Anti-infectives: Anti-infectives (From admission, onward)   Start     Dose/Rate Route Frequency Ordered Stop   09/08/17 1400  cefOXitin (MEFOXIN) 2 g in sodium chloride 0.9 % 100 mL IVPB     2 g 200 mL/hr over 30 Minutes Intravenous  Once 09/08/17 1359 09/08/17 1524       Assessment/Plan: s/p Procedure(s): LYSIS OF ADHESIONS LAPAROSCOPIC (N/A) DC NG, start sips of clears. Ambulate. Pulm toilet  LOS: 1 day    Clovis Riley 09/09/2017

## 2017-09-09 NOTE — Plan of Care (Signed)

## 2017-09-10 ENCOUNTER — Other Ambulatory Visit: Payer: Self-pay

## 2017-09-10 ENCOUNTER — Encounter (HOSPITAL_COMMUNITY): Payer: Self-pay | Admitting: General Practice

## 2017-09-10 LAB — CBC
HCT: 34.4 % — ABNORMAL LOW (ref 36.0–46.0)
Hemoglobin: 11.2 g/dL — ABNORMAL LOW (ref 12.0–15.0)
MCH: 29.8 pg (ref 26.0–34.0)
MCHC: 32.6 g/dL (ref 30.0–36.0)
MCV: 91.5 fL (ref 78.0–100.0)
Platelets: 154 10*3/uL (ref 150–400)
RBC: 3.76 MIL/uL — ABNORMAL LOW (ref 3.87–5.11)
RDW: 15.2 % (ref 11.5–15.5)
WBC: 10.9 10*3/uL — ABNORMAL HIGH (ref 4.0–10.5)

## 2017-09-10 LAB — BASIC METABOLIC PANEL
Anion gap: 6 (ref 5–15)
BUN: 10 mg/dL (ref 6–20)
CO2: 28 mmol/L (ref 22–32)
Calcium: 8.4 mg/dL — ABNORMAL LOW (ref 8.9–10.3)
Chloride: 105 mmol/L (ref 101–111)
Creatinine, Ser: 0.72 mg/dL (ref 0.44–1.00)
GFR calc Af Amer: >60 mL/min (ref >60)
GFR calc non Af Amer: >60 mL/min (ref >60)
Glucose, Bld: 105 mg/dL — ABNORMAL HIGH (ref 65–99)
Potassium: 3.4 mmol/L — ABNORMAL LOW (ref 3.5–5.1)
Sodium: 139 mmol/L (ref 135–145)

## 2017-09-10 LAB — MAGNESIUM: Magnesium: 2 mg/dL (ref 1.7–2.4)

## 2017-09-10 NOTE — Progress Notes (Signed)
2 Days Post-Op   Subjective/Chief Complaint: Continues to report a couple episodes of flatus. Denies abdominal pain or distention. Tolerating clears without n/v.   Objective: Vital signs in last 24 hours: Temp:  [98.1 F (36.7 C)-99.5 F (37.5 C)] 98.1 F (36.7 C) (04/23 0635) Pulse Rate:  [69-95] 69 (04/23 0635) Resp:  [16-22] 16 (04/23 0635) BP: (112-144)/(64-84) 112/72 (04/23 0635) SpO2:  [96 %-99 %] 98 % (04/23 0635) Last BM Date: 09/08/17  Intake/Output from previous day: 04/22 0701 - 04/23 0700 In: 1584 [P.O.:684; I.V.:900] Out: -  Intake/Output this shift: No intake/output data recorded.  General appearance: alert and cooperative Resp: clear to auscultation bilaterally GI: soft, nondistended, mildly appropriately tender left hemiabdomen Skin: Skin color, texture, turgor normal. No rashes or lesions  Lab Results:  Recent Labs    09/09/17 0504 09/10/17 0456  WBC 9.3 10.9*  HGB 12.4 11.2*  HCT 35.5* 34.4*  PLT 160 154   BMET Recent Labs    09/09/17 0504 09/10/17 0456  NA 138 139  K 3.2* 3.4*  CL 102 105  CO2 27 28  GLUCOSE 125* 105*  BUN 16 10  CREATININE 0.99 0.72  CALCIUM 8.3* 8.4*   PT/INR No results for input(s): LABPROT, INR in the last 72 hours. ABG No results for input(s): PHART, HCO3 in the last 72 hours.  Invalid input(s): PCO2, PO2  Studies/Results: Ct Abdomen Pelvis W Contrast  Result Date: 09/08/2017 CLINICAL DATA:  Patient c/o upper abdominal pain since 2am with nausea and vomiting. Patient states hx of gallstones, hx of "stomach problems" which he is unable to name. Patient denies changes in bowel or bladder. EXAM: CT ABDOMEN AND PELVIS WITH CONTRAST TECHNIQUE: Multidetector CT imaging of the abdomen and pelvis was performed using the standard protocol following bolus administration of intravenous contrast. CONTRAST:  110mL ISOVUE-300 IOPAMIDOL (ISOVUE-300) INJECTION 61% COMPARISON:  Ultrasound the abdomen 09/08/2017 FINDINGS: Lower  chest: There is bibasilar atelectasis. Heart size is normal. No pericardial effusion or significant coronary artery calcifications. Hepatobiliary: There is perihepatic fluid. Status post cholecystectomy. Mild dilatation of the intra and extrahepatic bile ducts. No focal liver lesions. Pancreas: Unremarkable. No pancreatic ductal dilatation or surrounding inflammatory changes. Spleen: Normal in size without focal abnormality. Adrenals/Urinary Tract: Adrenal glands are normal. There is a 3.1 centimeter parapelvic cyst in LOWER pole region of the LEFT kidney. There is no hydronephrosis for urinary tract obstruction. Urinary bladder is normal in appearance. Stomach/Bowel: The stomach has a normal appearance. There is dilatation and thickening of the wall of small bowel within the RIGHT UPPER QUADRANT. Within this region there is a point of tethering, best seen on coronal image number 72 of series 5, consistent with internal hernia. Mesenteric stranding is also associated with these bowel changes in the RIGHT UPPER QUADRANT. The terminal ileum is normal in appearance. There are scattered colonic diverticula but no acute diverticulitis. Vascular/Lymphatic: No significant vascular findings are present. No enlarged abdominal or pelvic lymph nodes. Reproductive: The uterus is present and contains a calcified fibroid. No adnexal mass. Other: Small amount of free pelvic fluid. The anterior abdominal wall is unremarkable. Musculoskeletal: Degenerative changes are seen in the LOWER thoracic and lumbar spine. No suspicious lytic or blastic lesions are identified. IMPRESSION: 1. Abnormal small bowel loops within the RIGHT UPPER QUADRANT, consisting of bowel wall thickening, bowel dilatation, and mesenteric stranding and. Perihepatic fluid. Given the abrupt transition zone for 2 adjacent loops, I suspect that this represents an internal hernia. 2. No free intraperitoneal air  or abscess. 3. Bibasilar atelectasis. 4. Cholecystectomy.  5. LEFT renal cyst. 6. Uterine fibroid. 7. Thoracic and lumbar spondylosis. These results were called by telephone at the time of interpretation on 09/08/2017 at 12:39 pm to Dr. Addison Lank , who verbally acknowledged these results. Electronically Signed   By: Nolon Nations M.D.   On: 09/08/2017 12:39   Dg Abd Portable 1 View  Result Date: 09/08/2017 CLINICAL DATA:  Nasogastric tube placement. EXAM: PORTABLE ABDOMEN - 1 VIEW COMPARISON:  09/08/2017 FINDINGS: Interval placement of nasogastric tube, tip overlying the level of the gastric fundus. There is contrast in nondilated collecting systems of the kidneys bilaterally. Bowel gas pattern is nonobstructive. IMPRESSION: Interval placement of nasogastric tube. Electronically Signed   By: Nolon Nations M.D.   On: 09/08/2017 14:14   US Abdomen Limited Ruq  Result Date: 09/08/2017 CLINICAL DATA:  RIGHT UPPER QUADRANT pain since 4 a.m. EXAM: ULTRASOUND ABDOMEN LIMITED RIGHT UPPER QUADRANT COMPARISON:  None. FINDINGS: Gallbladder: The gallbladder is not visualized and may be surgically absent. Common bile duct: Diameter: 11 millimeters-13 millimeters. Liver: There is mild intrahepatic biliary duct dilatation. No focal liver lesion. Normal parenchymal echogenicity. Portal vein is patent on color Doppler imaging with normal direction of blood flow towards the liver. Additional: There is a small amount of perihepatic fluid. IMPRESSION: 1. Intra and extrahepatic biliary duct dilatation. Gallbladder not seen. Suspect previous cholecystectomy. 2. Small amount of perihepatic fluid. Consider CT of the abdomen and pelvis for further evaluation. Electronically Signed   By: Nolon Nations M.D.   On: 09/08/2017 11:46    Anti-infectives: Anti-infectives (From admission, onward)   Start     Dose/Rate Route Frequency Ordered Stop   09/08/17 1400  cefOXitin (MEFOXIN) 2 g in sodium chloride 0.9 % 100 mL IVPB     2 g 200 mL/hr over 30 Minutes Intravenous  Once  09/08/17 1359 09/08/17 1524      Assessment/Plan: s/p Procedure(s): LYSIS OF ADHESIONS LAPAROSCOPIC (N/A) Advance to full liquids. Ambulate. Pulm toilet  LOS: 2 days     Ileana Roup 09/10/2017

## 2017-09-11 MED ORDER — MORPHINE SULFATE (PF) 2 MG/ML IV SOLN: 1.0000 mg | INTRAVENOUS | Status: DC | PRN

## 2017-09-11 NOTE — Progress Notes (Addendum)
3 Days Post-Op  Subjective: Feeling better.  Says she had a loose bowel movement yesterday morning but this is not documented.  Voiding okay.  Ambulating in room.   tolerating full liquid diet.  Pain well controlled.  Denies nausea. Stable and alert. When discharged, she will stay in [place] with her sister  Labs yesterday show potassium 3.4.  Creatinine 0.72.  Hemoglobin 11.2.  WBC 10,900.  Objective: Vital signs in last 24 hours: Temp:  [97.8 F (36.6 C)-98.1 F (36.7 C)] 97.8 F (36.6 C) (04/24 0552) Pulse Rate:  [69-84] 69 (04/24 0552) Resp:  [16] 16 (04/24 0552) BP: (111-134)/(69-79) 134/79 (04/24 0552) SpO2:  [97 %-100 %] 100 % (04/24 0552) Last BM Date: 09/10/17  Intake/Output from previous day: 04/23 0701 - 04/24 0700 In: 1912.5 [P.O.:345; I.V.:1567.5] Out: -  Intake/Output this shift: No intake/output data recorded.  General appearance: Alert.  Very talkative.  No distress.  Asks lots of questions and writes everything down. Resp: clear to auscultation bilaterally GI: Soft.  Almost no tenderness.  Bowel sounds present.  No bleeding from wound. Extremities: extremities normal, atraumatic, no cyanosis or edema  Lab Results:  No results found for this or any previous visit (from the past 24 hour(s)).   Studies/Results: No results found.  . enoxaparin (LOVENOX) injection  40 mg Subcutaneous Q24H     Assessment/Plan: s/p Procedure(s): LYSIS OF ADHESIONS LAPAROSCOPIC   Satisfactory progress.  No signs of complication Soft diet Reduce narcotic Reduce IV Possible discharge tomorrow  @PROBHOSP @  LOS: 3 days    Adin Hector 09/11/2017  . .prob

## 2017-09-11 NOTE — Care Management Note (Signed)
Case Management Note  Patient Details  Name: [name] MRN: [id number] Date of Birth: 02/16/1946  Subjective/Objective:    Admitted with SBO. Independent with ADL's , no DME usage PTA.               - S/P LYSIS OF ADHESIONS LAPAROSCOPIC   PCP: Leanna Battles  Action/Plan: Transition to home when medically stable with family( sister).  Pt states has transportation to home.  Expected Discharge Date:    09/12/2017           Expected Discharge Plan:  Home/Self Care  In-House Referral:     Discharge planning Services  CM Consult  Post Acute Care Choice:    Choice offered to:     DME Arranged:   NA DME Agency:   N/A  HH Arranged:   N/A HH Agency:   N/A  Status of Service:  Completed, signed off  If discussed at [place] of Stay Meetings, dates discussed:    Additional Comments:  Sharin Mons, RN 09/11/2017, 12:03 PM

## 2017-09-12 MED ORDER — IBUPROFEN 200 MG PO TABS: 200.0000 mg | ORAL_TABLET | Freq: Four times a day (QID) | ORAL | 0 refills | Status: DC | PRN

## 2017-09-12 MED ORDER — ACETAMINOPHEN 325 MG PO TABS: 650.0000 mg | ORAL_TABLET | Freq: Four times a day (QID) | ORAL | Status: AC | PRN

## 2017-09-12 NOTE — Discharge Summary (Signed)
[name] Discharge Summary   Patient ID: [name] MRN: [id number] DOB/AGE: June 08, 1945 72 y.o.  Admit date: 09/08/2017 Discharge date: 09/12/2017  Admitting Diagnosis: SBO due to internal hernia  Discharge Diagnosis Patient Active Problem List   Diagnosis Date Noted  . Internal hernia 09/08/2017  . ESSENTIAL HYPERTENSION, BENIGN 07/11/2009    Consultants None  Imaging: No results found.  Procedures Dr. Kieth Brightly (09/08/17) - Laparoscopic LOA  Hospital Course:  Patient is a 72 year old female who presented to [facility] with nausea, vomiting and abdominal pain.  Workup showed SBO with concern for internal hernia.  Patient was admitted and underwent procedure listed above.  Tolerated procedure well and was transferred to the floor.  Diet was advanced as tolerated.  On POD#4, the patient was voiding well, tolerating diet, ambulating well, pain well controlled, vital signs stable, incisions c/d/i and felt stable for discharge home.  Patient will follow up in our office in 1-2 weeks and knows to call with questions or concerns. She will call to confirm appointment date/time.    Physical Exam: General:  Alert, NAD, pleasant, comfortable Abd:  Soft, ND, mild tenderness, incisions C/D/I  Allergies as of 09/12/2017   No Known Allergies     Medication List    TAKE these medications   acetaminophen 325 MG tablet Commonly known as:  TYLENOL Take 2 tablets (650 mg total) by mouth every 6 (six) hours as needed for mild pain (or temp > 100).   aspirin 81 MG tablet Take 81 mg by mouth daily.   calcium carbonate 600 MG Tabs tablet Commonly known as:  OS-CAL Take 600 mg by mouth daily.   carvedilol 25 MG tablet Commonly known as:  COREG Take 25 mg by mouth 2 (two) times daily with a meal. Reported on 12/05/2015   Cinnamon 500 MG capsule Take 500 mg by mouth daily.   Cod Liver Oil 1000 MG Caps Take 1 capsule by mouth daily. During the winter months.    Estradiol 10 MCG Tabs vaginal tablet Place 1 tablet (10 mcg total) vaginally 2 (two) times a week.   Fish Oil 1000 MG Caps Take by mouth. Reported on 12/05/2015   ibuprofen 200 MG tablet Commonly known as:  MOTRIN IB Take 1 tablet (200 mg total) by mouth every 6 (six) hours as needed for mild pain.   losartan-hydrochlorothiazide 100-12.5 MG tablet Commonly known as:  HYZAAR Take 1 tablet by mouth daily.   multivitamin with minerals tablet Take 1 tablet by mouth daily.   VITAMIN B 12 PO Take 1 tablet by mouth daily.   vitamin E 400 UNIT capsule Take 400 Units by mouth daily.        Follow-up Information    [name] [place] [facility].   Specialty:  General [name] Why:  Call to confirm appointment date/time. Bring photo ID and insurance information. Please arrive 30 min prior to appointment time.  Contact information: [address] [place] [address] [phone]           Signed: Brigid Re, [name] 09/12/2017, 8:09 AM Pager: [phone] Consults: [phone] Mon-Fri 7:00 am-4:30 pm Sat-Sun 7:00 am-11:30 am

## 2017-09-12 NOTE — Discharge Instructions (Signed)
Please arrive at least 30 min before your appointment to complete your check in paperwork.  If you are unable to arrive 30 min prior to your appointment time we may have to cancel or reschedule you. ° °LAPAROSCOPIC SURGERY: POST OP INSTRUCTIONS  °1. DIET: Follow a light bland diet the first 24 hours after arrival home, such as soup, liquids, crackers, etc. Be sure to include lots of fluids daily. Avoid fast food or heavy meals as your are more likely to get nauseated. Eat a low fat the next few days after surgery.  °2. Take your usually prescribed home medications unless otherwise directed. °3. PAIN CONTROL:  °1. Pain is best controlled by a usual combination of three different methods TOGETHER:  °1. Ice/Heat °2. Over the counter pain medication °3. Prescription pain medication °2. Most patients will experience some swelling and bruising around the incisions. Ice packs or heating pads (30-60 minutes up to 6 times a day) will help. Use ice for the first few days to help decrease swelling and bruising, then switch to heat to help relax tight/sore spots and speed recovery. Some people prefer to use ice alone, heat alone, alternating between ice & heat. Experiment to what works for you. Swelling and bruising can take several weeks to resolve.  °3. It is helpful to take an over-the-counter pain medication regularly for the first few weeks. Choose one of the following that works best for you:  °1. Naproxen (Aleve, etc) Two 220mg tabs twice a day °2. Ibuprofen (Advil, etc) Three 200mg tabs four times a day (every meal & bedtime) °3. Acetaminophen (Tylenol, etc) 500-650mg four times a day (every meal & bedtime) °4. A prescription for pain medication (such as oxycodone, hydrocodone, etc) should be given to you upon discharge. Take your pain medication as prescribed.  °1. If you are having problems/concerns with the prescription medicine (does not control pain, nausea, vomiting, rash, itching, etc), please call us [phone]  [phone] to see if we need to switch you to a different pain medicine that will work better for you and/or control your side effect better. °2. If you need a refill on your pain medication, please contact your pharmacy. They will contact our office to request authorization. Prescriptions will not be filled after 5 pm or on week-ends. °4. Avoid getting constipated. Between the surgery and the pain medications, it is common to experience some constipation. Increasing fluid intake and taking a fiber supplement (such as Metamucil, Citrucel, FiberCon, MiraLax, etc) 1-2 times a day regularly will usually help prevent this problem from occurring. A mild laxative (prune juice, Milk of Magnesia, MiraLax, etc) should be taken according to package directions if there are no bowel movements after 48 hours.  °5. Watch out for diarrhea. If you have many loose bowel movements, simplify your diet to bland foods & liquids for a few days. Stop any stool softeners and decrease your fiber supplement. Switching to mild anti-diarrheal medications (Kayopectate, Pepto Bismol) can help. If this worsens or does not improve, please call us. °6. Wash / shower every day. You may shower over the dressings as they are waterproof. Continue to shower over incision(s) after the dressing is off. °7. Remove your waterproof bandages 5 days after surgery. You may leave the incision open to air. You may replace a dressing/Band-Aid to cover the incision for comfort if you wish.  °8. ACTIVITIES as tolerated:  °1. You may resume regular (light) daily activities beginning the next day--such as daily self-care, walking, climbing stairs--gradually   increasing activities as tolerated. If you can walk 30 minutes without difficulty, it is safe to try more intense activity such as jogging, treadmill, bicycling, low-impact aerobics, swimming, etc. °2. Save the most intensive and strenuous activity for last such as sit-ups, heavy lifting, contact sports, etc Refrain  from any heavy lifting or straining until you are off narcotics for pain control.  °3. DO NOT PUSH THROUGH PAIN. Let pain be your guide: If it hurts to do something, don't do it. Pain is your body warning you to avoid that activity for another week until the pain goes down. °4. You may drive when you are no longer taking prescription pain medication, you can comfortably wear a seatbelt, and you can safely maneuver your car and apply brakes. °5. You may have sexual intercourse when it is comfortable.  °9. FOLLOW UP in our office  °1. Please call [facility] at [phone] to set up an appointment to see your surgeon in the office for a follow-up appointment approximately 2-3 weeks after your surgery. °2. Make sure that you call for this appointment the day you arrive home to insure a convenient appointment time. °     10. IF YOU HAVE DISABILITY OR FAMILY LEAVE FORMS, BRING THEM TO THE               OFFICE FOR PROCESSING.  ° °WHEN TO CALL US [phone]:  °1. Poor pain control °2. Reactions / problems with new medications (rash/itching, nausea, etc)  °3. Fever over 101.5 F (38.5 C) °4. Inability to urinate °5. Nausea and/or vomiting °6. Worsening swelling or bruising °7. Continued bleeding from incision. °8. Increased pain, redness, or drainage from the incision ° °The clinic staff is available to answer your questions during regular business hours (8:30am-5pm). Please don’t hesitate to call and ask to speak to one of our nurses for clinical concerns.  °If you have a medical emergency, go to the nearest emergency room or call 911.  °A surgeon from [facility] is always on call at the hospitals  ° °[facility]  °[address], [address], [place], [place] [address] ?  °MAIN: [phone] ? TOLL FREE: [phone] ?  °FAX [phone]  °[email or web address] ° °

## 2017-09-12 NOTE — Care Management Important Message (Signed)
Important Message  Patient Details  Name: [name] MRN: [id number] Date of Birth: 1945/09/09   Medicare Important Message Given:  Yes    Orbie Pyo 09/12/2017, 3:10 PM

## 2017-09-12 NOTE — Progress Notes (Signed)
Patient requested not to complete ambulation q 4 hours during night/sleeping hours. Patient states she will attempt to resume ambulation today as prescribed by MD. Will continue to monitor.

## 2017-11-08 ENCOUNTER — Other Ambulatory Visit: Payer: Self-pay | Admitting: Internal Medicine

## 2017-11-08 DIAGNOSIS — Z1231 Encounter for screening mammogram for malignant neoplasm of breast: Secondary | ICD-10-CM

## 2017-12-03 ENCOUNTER — Ambulatory Visit
Admission: RE | Admit: 2017-12-03 | Discharge: 2017-12-03 | Disposition: A | Discharge status: Home / Self Care | Payer: Medicare Other | Source: Ambulatory Visit | Attending: Internal Medicine | Admitting: Internal Medicine

## 2017-12-03 DIAGNOSIS — Z1231 Encounter for screening mammogram for malignant neoplasm of breast: Secondary | ICD-10-CM

## 2017-12-21 ENCOUNTER — Emergency Department (HOSPITAL_COMMUNITY)
Admission: EM | Admit: 2017-12-21 | Discharge: 2017-12-21 | Disposition: A | Discharge status: Home / Self Care | Payer: Medicare Other | Attending: Emergency Medicine | Admitting: Emergency Medicine

## 2017-12-21 ENCOUNTER — Emergency Department (HOSPITAL_COMMUNITY): Payer: Medicare Other

## 2017-12-21 ENCOUNTER — Encounter (HOSPITAL_COMMUNITY): Payer: Self-pay | Admitting: Student

## 2017-12-21 DIAGNOSIS — I10 Essential (primary) hypertension: Secondary | ICD-10-CM | POA: Diagnosis not present

## 2017-12-21 DIAGNOSIS — Z96652 Presence of left artificial knee joint: Secondary | ICD-10-CM | POA: Diagnosis not present

## 2017-12-21 DIAGNOSIS — R42 Dizziness and giddiness: Secondary | ICD-10-CM | POA: Diagnosis not present

## 2017-12-21 DIAGNOSIS — Z7982 Long term (current) use of aspirin: Secondary | ICD-10-CM | POA: Insufficient documentation

## 2017-12-21 DIAGNOSIS — Z79899 Other long term (current) drug therapy: Secondary | ICD-10-CM | POA: Diagnosis not present

## 2017-12-21 DIAGNOSIS — R55 Syncope and collapse: Secondary | ICD-10-CM

## 2017-12-21 DIAGNOSIS — I959 Hypotension, unspecified: Secondary | ICD-10-CM | POA: Diagnosis not present

## 2017-12-21 LAB — BASIC METABOLIC PANEL
Anion gap: 11 (ref 5–15)
BUN: 20 mg/dL (ref 8–23)
CO2: 29 mmol/L (ref 22–32)
Calcium: 9.1 mg/dL (ref 8.9–10.3)
Chloride: 104 mmol/L (ref 98–111)
Creatinine, Ser: 1.12 mg/dL — ABNORMAL HIGH (ref 0.44–1.00)
GFR calc Af Amer: 56 mL/min — ABNORMAL LOW (ref >60)
GFR calc non Af Amer: 48 mL/min — ABNORMAL LOW (ref >60)
Glucose, Bld: 111 mg/dL — ABNORMAL HIGH (ref 70–99)
Potassium: 2.9 mmol/L — ABNORMAL LOW (ref 3.5–5.1)
Sodium: 144 mmol/L (ref 135–145)

## 2017-12-21 LAB — URINALYSIS, ROUTINE W REFLEX MICROSCOPIC
Bilirubin Urine: NEGATIVE
Glucose, UA: NEGATIVE mg/dL
Hgb urine dipstick: NEGATIVE
Ketones, ur: NEGATIVE mg/dL
Leukocytes, UA: NEGATIVE
Nitrite: NEGATIVE
Protein, ur: NEGATIVE mg/dL
Specific Gravity, Urine: 1.014 (ref 1.005–1.030)
pH: 8 (ref 5.0–8.0)

## 2017-12-21 LAB — TROPONIN I
Troponin I: <0.03 ng/mL (ref <0.03)
Troponin I: <0.03 ng/mL (ref <0.03)

## 2017-12-21 LAB — CBC
HCT: 35.8 % — ABNORMAL LOW (ref 36.0–46.0)
Hemoglobin: 11.8 g/dL — ABNORMAL LOW (ref 12.0–15.0)
MCH: 30.5 pg (ref 26.0–34.0)
MCHC: 33 g/dL (ref 30.0–36.0)
MCV: 92.5 fL (ref 78.0–100.0)
Platelets: 143 10*3/uL — ABNORMAL LOW (ref 150–400)
RBC: 3.87 MIL/uL (ref 3.87–5.11)
RDW: 14 % (ref 11.5–15.5)
WBC: 7.4 10*3/uL (ref 4.0–10.5)

## 2017-12-21 MED ORDER — POTASSIUM CHLORIDE CRYS ER 20 MEQ PO TBCR: 20.0000 meq | EXTENDED_RELEASE_TABLET | Freq: Every day | ORAL | 0 refills | Status: DC

## 2017-12-21 MED ORDER — SODIUM CHLORIDE 0.9 % IV BOLUS
500.0000 mL | Freq: Once | INTRAVENOUS | Status: AC
  Administered 2017-12-21: 500 mL via INTRAVENOUS

## 2017-12-21 MED ORDER — POTASSIUM CHLORIDE CRYS ER 20 MEQ PO TBCR
40.0000 meq | EXTENDED_RELEASE_TABLET | Freq: Once | ORAL | Status: AC
  Administered 2017-12-21: 40 meq via ORAL
  Filled 2017-12-21: qty 2

## 2017-12-21 NOTE — ED Notes (Signed)
ED MD in to assess pt

## 2017-12-21 NOTE — ED Notes (Signed)
Signature pad unavailable at time of pt discharge. Pt verbalized understanding of d/c instructions.  

## 2017-12-21 NOTE — Discharge Instructions (Addendum)
You were seen in the emergency department today after nearly passing out.  Your blood pressure was a bit low, we suspect this may be the cause.  Please do not take your nighttime blood pressure medication doses until you see your primary care doctor.  Please call your primary care office on Monday in order to set up appointment in the next 3 days for reevaluation and possible adjustment of your blood pressure medicines.  Please be sure to drink and eat plenty.  Your potassium was somewhat low in the emergency department today for this reason we gave you some potassium here and start sending you home with a couple days of potassium.  Return to the ER for new or worsening symptoms including but not limited to feeling is that he may pass out again, passing out, chest pain, trouble breathing, or any other concerns that you may have.

## 2017-12-21 NOTE — ED Notes (Signed)
Blood for labs drawn from [facility] x1 attempt via butterfly needle; pt tolerated well - tubed to lab

## 2017-12-21 NOTE — ED Notes (Signed)
Lab confirmed they received troponin and are running it.

## 2017-12-21 NOTE — ED Triage Notes (Signed)
Developed dizziness and chest pain while in meeting at [employer]; symptoms lasted approx 15 min - resolved on own - initial BP 76/42 with EMS; BP 98/74 after 500 cc bolus NS; presently caox4

## 2017-12-21 NOTE — ED Provider Notes (Signed)
[facility] EMERGENCY DEPARTMENT Provider Note   CSN: [id number] Arrival date & time: 12/21/17  1341     History   Chief Complaint Chief Complaint  Patient presents with  . near syncope/CP    HPI [name] is a 72 y.o. female with a hx of HTN who presents to the emergency department via EMS status post near syncopal episode shortly prior to arrival.  Patient states that she was sitting at church when she started to feel a bit lightheaded, she stood up because she needed to go to the bathroom and her lightheadedness increased and she felt as though she may pass out, did have associated nausea.  She sat back down with some minimal improvement in her symptoms.  The lightheadedness persisted for approximately 30 minutes and then resolved for the most part, she is feeling generally back to baseline with some possible minimal remaining lightheadedness.  No syncopal episode occurred.  Other than position changes no specific alleviating or aggravating factors.  She reports she has has had very similar episodes over the past few months, slightly less severe than today.  Despite triage note patient reports to me that she did not have any chest pain throughout the day today.  She additionally denies dizziness like the room spinning, shortness of breath, syncopal episode, numbness, weakness, headache, vomiting, diarrhea, or abdominal pain.  She reports that she is been eating and drinking normally today, she ate breakfast and has had 3 glasses of water, she reports that she walked 2 miles this morning for exercise.  Denies recent changes in her medications.  Per EMS to triage team upon arrival patient's initial blood pressure was in the 82N systolic after 053 cc of NS BP improved to 90 systolic.  HPI  Past Medical History:  Diagnosis Date  . Hypertension     Patient Active Problem List   Diagnosis Date Noted  . Internal hernia 09/08/2017  . ESSENTIAL HYPERTENSION, BENIGN  07/11/2009    Past Surgical History:  Procedure Laterality Date  . LAPAROSCOPIC LYSIS OF ADHESIONS  09/08/2017  . LAPAROSCOPY N/A 09/08/2017   Procedure: LYSIS OF ADHESIONS LAPAROSCOPIC;  Surgeon: Kinsinger, Arta Bruce, MD;  Location: [facility];  Service: General;  Laterality: N/A;  . REPLACEMENT TOTAL KNEE Left      OB History    Gravida  4   Para  3   Term  2   Preterm  1   AB  1   Living  3     SAB  1   TAB      Ectopic      Multiple      Live Births  3            Home Medications    Prior to Admission medications   Medication Sig Start Date End Date Taking? Authorizing Provider  acetaminophen (TYLENOL) 325 MG tablet Take 2 tablets (650 mg total) by mouth every 6 (six) hours as needed for mild pain (or temp > 100). 09/12/17   Rayburn, Claiborne Billings A, PA-C  aspirin 81 MG tablet Take 81 mg by mouth daily.    [provider]  calcium carbonate (OS-CAL) 600 MG TABS tablet Take 600 mg by mouth daily.     [provider]  carvedilol (COREG) 25 MG tablet Take 25 mg by mouth 2 (two) times daily with a meal. Reported on 12/05/2015    [provider]  Cinnamon 500 MG capsule Take 500 mg by mouth  daily.    [provider]  [facility] Liver Oil 1000 MG CAPS Take 1 capsule by mouth daily. During the winter months.    [provider]  Cyanocobalamin (VITAMIN B 12 PO) Take 1 tablet by mouth daily.     [provider]  Estradiol 10 MCG TABS vaginal tablet Place 1 tablet (10 mcg total) vaginally 2 (two) times a week. Patient not taking: Reported on 09/08/2017 12/05/15   Terrance Mass, MD  ibuprofen (MOTRIN IB) 200 MG tablet Take 1 tablet (200 mg total) by mouth every 6 (six) hours as needed for mild pain. 09/12/17   Rayburn, Floyce Stakes, PA-C  losartan-hydrochlorothiazide (HYZAAR) 100-12.5 MG tablet Take 1 tablet by mouth daily.    [provider]  Multiple Vitamins-Minerals (MULTIVITAMIN WITH MINERALS) tablet Take 1 tablet by mouth  daily.    [provider]  Omega-3 Fatty Acids (FISH OIL) 1000 MG CAPS Take by mouth. Reported on 12/05/2015    [provider]  vitamin E 400 UNIT capsule Take 400 Units by mouth daily.    [provider]    Family History Family History  Problem Relation Age of Onset  . Cancer Mother        STOMACH  . Heart disease Father   . Diabetes Sister   . Diabetes Brother     Social History Social History   Tobacco Use  . Smoking status: Never Smoker  . Smokeless tobacco: Never Used  Substance Use Topics  . Alcohol use: No    Alcohol/week: 0.0 oz    Comment: occasional  . Drug use: No     Allergies   Patient has no known allergies.   Review of Systems Review of Systems  Constitutional: Negative for chills and fever.  Respiratory: Negative for shortness of breath.   Cardiovascular: Negative for chest pain, palpitations and leg swelling.  Gastrointestinal: Positive for nausea. Negative for abdominal pain, blood in stool, constipation, diarrhea and vomiting.  Neurological: Positive for light-headedness. Negative for dizziness, seizures, syncope, speech difficulty, weakness and numbness.  All other systems reviewed and are negative.    Physical Exam Updated Vital Signs BP 104/63 (BP Location: Left Arm)   Pulse 67   Temp 97.8 F (36.6 C) (Oral)   Resp 16   SpO2 100%   Physical Exam  Constitutional: She appears well-developed and well-nourished. No distress.  HENT:  Head: Normocephalic and atraumatic.  Eyes: Conjunctivae are normal. Right eye exhibits no discharge. Left eye exhibits no discharge.  Cardiovascular: Normal rate and regular rhythm.  No murmur heard. Pulses:      Radial pulses are 2+ on the right side, and 2+ on the left side.  Pulmonary/Chest: Breath sounds normal. No respiratory distress. She has no wheezes. She has no rales.  Abdominal: Soft. She exhibits no distension. There is no tenderness.  Musculoskeletal: She exhibits  edema (trace symmetric to lower legs). She exhibits no tenderness or deformity.  Neurological:  Alert. Clear speech. No facial droop. CNIII-XII grossly intact. Bilateral upper and lower extremities' sensation grossly intact. 5/5 symmetric strength with grip strength and with plantar and dorsi flexion bilaterally.. Normal finger to nose bilaterally. Negative pronator drift. Negative Romberg sign. Gait is intact.   Skin: Skin is warm and dry. No rash noted.  Psychiatric: She has a normal mood and affect. Her behavior is normal.  Nursing note and vitals reviewed.    ED Treatments / Results  Labs Results for orders placed or performed during the  hospital encounter of 12/21/17  CBC  Result Value Ref Range   WBC 7.4 4.0 - 10.5 K/uL   RBC 3.87 3.87 - 5.11 MIL/uL   Hemoglobin 11.8 (L) 12.0 - 15.0 g/dL   HCT 35.8 (L) 36.0 - 46.0 %   MCV 92.5 78.0 - 100.0 fL   MCH 30.5 26.0 - 34.0 pg   MCHC 33.0 30.0 - 36.0 g/dL   RDW 14.0 11.5 - 15.5 %   Platelets 143 (L) 150 - 400 K/uL  Basic metabolic panel  Result Value Ref Range   Sodium 144 135 - 145 mmol/L   Potassium 2.9 (L) 3.5 - 5.1 mmol/L   Chloride 104 98 - 111 mmol/L   CO2 29 22 - 32 mmol/L   Glucose, Bld 111 (H) 70 - 99 mg/dL   BUN 20 8 - 23 mg/dL   Creatinine, Ser 1.12 (H) 0.44 - 1.00 mg/dL   Calcium 9.1 8.9 - 10.3 mg/dL   GFR calc non Af Amer 48 (L) >60 mL/min   GFR calc Af Amer 56 (L) >60 mL/min   Anion gap 11 5 - 15  Troponin I  Result Value Ref Range   Troponin I <0.03 <0.03 ng/mL  Urinalysis, Routine w reflex microscopic  Result Value Ref Range   Color, Urine YELLOW YELLOW   APPearance CLEAR CLEAR   Specific Gravity, Urine 1.014 1.005 - 1.030   pH 8.0 5.0 - 8.0   Glucose, UA NEGATIVE NEGATIVE mg/dL   Hgb urine dipstick NEGATIVE NEGATIVE   Bilirubin Urine NEGATIVE NEGATIVE   Ketones, ur NEGATIVE NEGATIVE mg/dL   Protein, ur NEGATIVE NEGATIVE mg/dL   Nitrite NEGATIVE NEGATIVE   Leukocytes, UA NEGATIVE NEGATIVE     EKG None  Radiology Dg Chest 2 View  Result Date: 12/21/2017 CLINICAL DATA:  Weakness, hypotension EXAM: CHEST - 2 VIEW COMPARISON:  11/11/2007 FINDINGS: Lungs are clear.  No pleural effusion or pneumothorax. The heart is normal in size. Mild degenerative changes of the visualized thoracolumbar spine. IMPRESSION: Normal chest radiographs. Electronically Signed   By: Julian Hy M.D.   On: 12/21/2017 16:04    Procedures Procedures (including critical care time)  Medications Ordered in ED Medications  sodium chloride 0.9 % bolus 500 mL (0 mLs Intravenous Stopped 12/21/17 1859)  potassium chloride SA (K-DUR,KLOR-CON) CR tablet 40 mEq (40 mEq Oral Given 12/21/17 1923)    Initial Impression / Assessment and Plan / ED Course  I have reviewed the triage vital signs and the nursing notes.  Pertinent labs & imaging results that were available during my care of the patient were reviewed by me and considered in my medical decision making (see chart for details).   Patient presents to the emergency department with near syncopal episode, feeling fairly back to baseline at present.  Patient nontoxic-appearing, no apparent distress, initial ER vitals WNL.  Patient has a fairly benign physical exam without focal neurologic deficits.    Work-up reviewed and fairly unremarkable.  EKG with normal sinus rhythm, no obvious ischemia, troponin WNL.  Patient is hypokalemic with a potassium of 2.9, qtc 442, will orally replace in the emergency department with short term prescription. Her creatinine is slightly elevated when compared to previous labs, today is 1.12, previously 0.99, she is receiving fluids, this may be rechecked by her primary care provider.  Hemoglobin of 11.8 appears at baseline. Platelets of 143 slightly decreased, but fairly similar to prior. Urinalysis without evidence of infection or significant dehydration. CXR without infiltrate, pneumothorax, or  effusion.   EKG without obvious  ischemia, delta troponin negative, low risk heart score, patient denied chest pain to me, doubt ACS . Low risk wells, no dyspnea/CP, doubt PE. Cardiac monitor reviewed no tachycardia or arrhythmias in the ED. Given patient with soft pressures in the ER and EMS reported systolics in the 65B prior to fluids, suspect that low blood pressure is underlying etiology to patient's symptoms, however this is not definitive.  We will have patient discuss her blood pressure management regimen with her primary care provider for possible adjustments with close follow up, instructed not to take her night time dose of BP medication until PCP follow up. I discussed results, treatment plan, need for PCP follow-up, and strict return precautions with the patient and family at bedside. Provided opportunity for questions, patient and family confirmed understanding and are in agreement with plan.   Findings and plan of care discussed with supervising physician Dr. Tomi Bamberger who personally evaluated and examined this patient and is in agreement with plan.   Final Clinical Impressions(s) / ED Diagnoses   Final diagnoses:  Near syncope    ED Discharge Orders        Ordered    potassium chloride SA (K-DUR,KLOR-CON) 20 MEQ tablet  Daily     12/21/17 [address] PA-C 12/22/17 Jess Barters, MD 12/24/17 1719

## 2018-07-15 ENCOUNTER — Encounter: Payer: Self-pay | Admitting: Internal Medicine

## 2018-07-15 ENCOUNTER — Ambulatory Visit (INDEPENDENT_AMBULATORY_CARE_PROVIDER_SITE_OTHER): Payer: Medicare Other | Admitting: Internal Medicine

## 2018-07-15 VITALS — BP 120/80 | HR 74 | Temp 98.7°F | Ht 62.0 in | Wt 158.0 lb

## 2018-07-15 DIAGNOSIS — I10 Essential (primary) hypertension: Secondary | ICD-10-CM

## 2018-07-15 DIAGNOSIS — Z1211 Encounter for screening for malignant neoplasm of colon: Secondary | ICD-10-CM | POA: Diagnosis not present

## 2018-07-15 DIAGNOSIS — Z780 Asymptomatic menopausal state: Secondary | ICD-10-CM

## 2018-07-15 DIAGNOSIS — E785 Hyperlipidemia, unspecified: Secondary | ICD-10-CM | POA: Insufficient documentation

## 2018-07-15 DIAGNOSIS — Z13820 Encounter for screening for osteoporosis: Secondary | ICD-10-CM

## 2018-07-15 NOTE — Patient Instructions (Addendum)
Great meeting you this morning.  Instructions: -Schedule an annual physical, come fasting, so we can draw blood work that day; also be prepared for a pap. -Schedule your medicare visit with Raynelle Dick the same day.

## 2018-07-15 NOTE — Progress Notes (Signed)
New Patient Office Visit     CC/Reason for Visit: establish care, follow up on chronic conditions Previous PCP:   HPI: [name] is a 73 y.o. female who is coming in today for the above mentioned reason. Past Medical History is significant for hypertension, hyperlipidemia and left total knee replacement in 2009.  Patient lives with her sister.  Patient sts she is due for an annual physical exam and pap. Retake of blood pressure was 120/80.    She has no acute complaints today. Her HTN and HLD have been well controlled per her reports.  Will refer to GI for colon cancer screening, PAP when she returns. Mammogram due in 7/20.  UTD on immunizations (she will bring [name] records).  DEXA scan ordered.   Past Medical/Surgical History: Past Medical History:  Diagnosis Date  . Hyperlipidemia   . Hypertension     Past Surgical History:  Procedure Laterality Date  . LAPAROSCOPIC LYSIS OF ADHESIONS  09/08/2017  . LAPAROSCOPY N/A 09/08/2017   Procedure: LYSIS OF ADHESIONS LAPAROSCOPIC;  Surgeon: Kinsinger, Arta Bruce, MD;  Location: [facility];  Service: General;  Laterality: N/A;  . REPLACEMENT TOTAL KNEE Left     Social History:  reports that she has never smoked. She has never used smokeless tobacco. She reports that she does not drink alcohol or use drugs.  Allergies: No Known Allergies  Family History:  Family History  Problem Relation Age of Onset  . Cancer Mother        STOMACH  . Heart disease Father   . Diabetes Sister   . Diabetes Brother      Current Outpatient Medications:  .  acetaminophen (TYLENOL) 325 MG tablet, Take 2 tablets (650 mg total) by mouth every 6 (six) hours as needed for mild pain (or temp > 100)., Disp: , Rfl:  .  aspirin 81 MG tablet, Take 81 mg by mouth daily., Disp: , Rfl:  .  calcium carbonate (OS-CAL) 600 MG TABS tablet, Take 600 mg by mouth daily. , Disp: , Rfl:  .  carboxymethylcellulose (REFRESH PLUS) 0.5 % SOLN, Place 1 drop into  both eyes daily as needed (dry eyes)., Disp: , Rfl:  .  carvedilol (COREG) 25 MG tablet, Take 25 mg by mouth 2 (two) times daily with a meal. Reported on 12/05/2015, Disp: , Rfl:  .  Cinnamon 500 MG capsule, Take 500 mg by mouth daily., Disp: , Rfl:  .  C[facility] Liver Oil 1000 MG CAPS, Take 1 capsule by mouth daily. During the winter months., Disp: , Rfl:  .  Cyanocobalamin (VITAMIN B 12 PO), Take 1 tablet by mouth daily. , Disp: , Rfl:  .  ibuprofen (MOTRIN IB) 200 MG tablet, Take 1 tablet (200 mg total) by mouth every 6 (six) hours as needed for mild pain., Disp: 30 tablet, Rfl: 0 .  losartan-hydrochlorothiazide (HYZAAR) 100-12.5 MG tablet, Take 1 tablet by mouth daily., Disp: , Rfl:  .  Multiple Vitamins-Minerals (MULTIVITAMIN WITH MINERALS) tablet, Take 1 tablet by mouth daily., Disp: , Rfl:  .  Omega-3 Fatty Acids (FISH OIL) 1000 MG CAPS, Take by mouth. Reported on 12/05/2015, Disp: , Rfl:  .  potassium chloride SA (K-DUR,KLOR-CON) 20 MEQ tablet, Take 1 tablet (20 mEq total) by mouth daily., Disp: 3 tablet, Rfl: 0 .  psyllium (METAMUCIL) 58.6 % packet, Take 1 packet by mouth as needed (constipation)., Disp: , Rfl:  .  vitamin E 400 UNIT capsule, Take 400 Units by mouth  daily., Disp: , Rfl:  .  pravastatin (PRAVACHOL) 40 MG tablet, , Disp: , Rfl:   Review of Systems:  Constitutional: Denies fever, chills, diaphoresis, appetite change and fatigue.  HEENT: Denies photophobia, eye pain, redness, hearing loss, ear pain, congestion, sore throat, rhinorrhea, sneezing, mouth sores, trouble swallowing, neck pain, neck stiffness and tinnitus.   Respiratory: Denies SOB, DOE, cough, chest tightness,  and wheezing.   Cardiovascular: Denies chest pain, palpitations and leg swelling.  Gastrointestinal: Denies nausea, vomiting, abdominal pain, diarrhea, constipation, blood in stool and abdominal distention.  Genitourinary: Denies dysuria, urgency, frequency, hematuria, flank pain and difficulty urinating.    Endocrine: Denies: hot or cold intolerance, sweats, changes in hair or nails, polyuria, polydipsia. Musculoskeletal: Denies myalgias, back pain, joint swelling, arthralgias and gait problem.  Skin: Denies pallor, rash and wound.  Neurological: Denies dizziness, seizures, syncope, weakness, light-headedness, numbness and headaches.  Hematological: Denies adenopathy. Easy bruising, personal or family bleeding history  Psychiatric/Behavioral: Denies suicidal ideation, mood changes, confusion, nervousness, sleep disturbance and agitation   Physical Exam: Vitals:   07/15/18 0728  BP: 120/80  Pulse: 74  Temp: 98.7 F (37.1 C)  TempSrc: Oral  SpO2: 98%  Weight: 158 lb (71.7 kg)  Height: 5\' 2"  (1.575 m)   Body mass index is 28.9 kg/m.   Constitutional: NAD, calm, comfortable Eyes: PERRL, lids and conjunctivae normal Respiratory: clear to auscultation bilaterally, no wheezing, no crackles. Normal respiratory effort. No accessory muscle use.  Cardiovascular: Regular rate and rhythm, no murmurs / rubs / gallops. No extremity edema. 2+ pedal pulses. No carotid bruits.  Psychiatric: Normal judgment and insight. Alert and oriented x 3. Normal mood.    Impression and Plan:  Essential hypertension, benign -well controlled. -cont taking bp medication as rxn  Screening for colon cancer  -Ambulatory referral to Gastroenterology; this will probably be her last colonoscopy.  Screening for osteoporosis  -DEXA scan requested  Hyperlipidemia, unspecified hyperlipidemia type -Last LDL was 106 in 8/18. -On pravastatin, recheck lipids at time of CPE.    Patient Instructions  Great meeting you this morning.  Instructions: -Schedule an annual physical, come fasting, so we can draw blood work that day; also be prepared for a pap. -Schedule your medicare visit with Raynelle Dick the same day.       Enzo Bi, RN DNP [facility]

## 2018-07-28 ENCOUNTER — Ambulatory Visit (INDEPENDENT_AMBULATORY_CARE_PROVIDER_SITE_OTHER)
Admission: RE | Admit: 2018-07-28 | Discharge: 2018-07-28 | Disposition: A | Discharge status: Home / Self Care | Payer: Medicare Other | Source: Ambulatory Visit | Attending: Internal Medicine | Admitting: Internal Medicine

## 2018-07-28 ENCOUNTER — Other Ambulatory Visit: Payer: Self-pay | Admitting: Internal Medicine

## 2018-07-28 DIAGNOSIS — Z13820 Encounter for screening for osteoporosis: Secondary | ICD-10-CM

## 2018-07-28 DIAGNOSIS — Z780 Asymptomatic menopausal state: Secondary | ICD-10-CM

## 2018-07-31 ENCOUNTER — Other Ambulatory Visit: Payer: Self-pay | Admitting: Internal Medicine

## 2018-07-31 ENCOUNTER — Encounter: Payer: Self-pay | Admitting: Internal Medicine

## 2018-07-31 ENCOUNTER — Other Ambulatory Visit: Payer: Self-pay

## 2018-07-31 ENCOUNTER — Ambulatory Visit (INDEPENDENT_AMBULATORY_CARE_PROVIDER_SITE_OTHER): Payer: Medicare Other | Admitting: Internal Medicine

## 2018-07-31 VITALS — BP 130/80 | HR 66 | Temp 98.8°F | Ht 62.0 in | Wt 160.7 lb

## 2018-07-31 DIAGNOSIS — I10 Essential (primary) hypertension: Secondary | ICD-10-CM | POA: Diagnosis not present

## 2018-07-31 DIAGNOSIS — Z0000 Encounter for general adult medical examination without abnormal findings: Secondary | ICD-10-CM

## 2018-07-31 DIAGNOSIS — Z1159 Encounter for screening for other viral diseases: Secondary | ICD-10-CM

## 2018-07-31 DIAGNOSIS — E785 Hyperlipidemia, unspecified: Secondary | ICD-10-CM | POA: Diagnosis not present

## 2018-07-31 DIAGNOSIS — E876 Hypokalemia: Secondary | ICD-10-CM

## 2018-07-31 DIAGNOSIS — Z23 Encounter for immunization: Secondary | ICD-10-CM | POA: Diagnosis not present

## 2018-07-31 LAB — LIPID PANEL
Cholesterol: 204 mg/dL — ABNORMAL HIGH (ref 0–200)
HDL: 60 mg/dL (ref >39.00)
LDL Cholesterol: 116 mg/dL — ABNORMAL HIGH (ref 0–99)
NonHDL: 143.63
Total CHOL/HDL Ratio: 3
Triglycerides: 139 mg/dL (ref 0.0–149.0)
VLDL: 27.8 mg/dL (ref 0.0–40.0)

## 2018-07-31 LAB — COMPREHENSIVE METABOLIC PANEL
ALT: 29 U/L (ref 0–35)
AST: 29 U/L (ref 0–37)
Albumin: 4 g/dL (ref 3.5–5.2)
Alkaline Phosphatase: 59 U/L (ref 39–117)
BUN: 21 mg/dL (ref 6–23)
CO2: 33 mEq/L — ABNORMAL HIGH (ref 19–32)
Calcium: 9.6 mg/dL (ref 8.4–10.5)
Chloride: 102 mEq/L (ref 96–112)
Creatinine, Ser: 0.84 mg/dL (ref 0.40–1.20)
GFR: 80.56 mL/min (ref >60.00)
Glucose, Bld: 93 mg/dL (ref 70–99)
Potassium: 3.3 mEq/L — ABNORMAL LOW (ref 3.5–5.1)
Sodium: 143 mEq/L (ref 135–145)
Total Bilirubin: 0.9 mg/dL (ref 0.2–1.2)
Total Protein: 6.8 g/dL (ref 6.0–8.3)

## 2018-07-31 LAB — CBC WITH DIFFERENTIAL/PLATELET
Basophils Absolute: 0 10*3/uL (ref 0.0–0.1)
Basophils Relative: 0.6 % (ref 0.0–3.0)
Eosinophils Absolute: 0.2 10*3/uL (ref 0.0–0.7)
Eosinophils Relative: 4.2 % (ref 0.0–5.0)
HCT: 37.6 % (ref 36.0–46.0)
Hemoglobin: 12.6 g/dL (ref 12.0–15.0)
Lymphocytes Relative: 31.8 % (ref 12.0–46.0)
Lymphs Abs: 1.8 10*3/uL (ref 0.7–4.0)
MCHC: 33.4 g/dL (ref 30.0–36.0)
MCV: 93.4 fl (ref 78.0–100.0)
Monocytes Absolute: 0.4 10*3/uL (ref 0.1–1.0)
Monocytes Relative: 7.3 % (ref 3.0–12.0)
Neutro Abs: 3.1 10*3/uL (ref 1.4–7.7)
Neutrophils Relative %: 56.1 % (ref 43.0–77.0)
Platelets: 158 10*3/uL (ref 150.0–400.0)
RBC: 4.03 Mil/uL (ref 3.87–5.11)
RDW: 15 % (ref 11.5–15.5)
WBC: 5.5 10*3/uL (ref 4.0–10.5)

## 2018-07-31 LAB — TSH: TSH: 1.92 u[IU]/mL (ref 0.35–4.50)

## 2018-07-31 MED ORDER — POTASSIUM CHLORIDE ER 10 MEQ PO TBCR: 10.0000 meq | EXTENDED_RELEASE_TABLET | Freq: Every day | ORAL | 0 refills | Status: DC

## 2018-07-31 NOTE — Progress Notes (Signed)
Established Patient Office Visit     CC/Reason for Visit: Preventive exam and subsequent Medicare wellness visit  HPI: [name] is a 73 y.o. female who is coming in today for the above mentioned reasons. Past Medical History is significant for: Hyperlipidemia on a statin that has been well controlled, hypertension that has been well controlled.  She has no new complaints today.  She will receive flu today, she is up-to-date on all other immunizations.  She had a mammogram in July 2019 that was reported as normal, she has now aged out of colonoscopies and Pap smears for colon and cervical cancer screening respectively.  She has routine eye and dental care.   Past Medical/Surgical History: Past Medical History:  Diagnosis Date  . Hyperlipidemia   . Hypertension     Past Surgical History:  Procedure Laterality Date  . LAPAROSCOPIC LYSIS OF ADHESIONS  09/08/2017  . LAPAROSCOPY N/A 09/08/2017   Procedure: LYSIS OF ADHESIONS LAPAROSCOPIC;  Surgeon: Kinsinger, Arta Bruce, MD;  Location: [facility];  Service: General;  Laterality: N/A;  . REPLACEMENT TOTAL KNEE Left     Social History:  reports that she has never smoked. She has never used smokeless tobacco. She reports that she does not drink alcohol or use drugs.  Allergies: No Known Allergies  Family History:  Family History  Problem Relation Age of Onset  . Cancer Mother        STOMACH  . Heart disease Father   . Diabetes Sister   . Diabetes Brother      Current Outpatient Medications:  .  acetaminophen (TYLENOL) 325 MG tablet, Take 2 tablets (650 mg total) by mouth every 6 (six) hours as needed for mild pain (or temp > 100)., Disp: , Rfl:  .  aspirin 81 MG tablet, Take 81 mg by mouth daily., Disp: , Rfl:  .  calcium carbonate (OS-CAL) 600 MG TABS tablet, Take 600 mg by mouth daily. , Disp: , Rfl:  .  carboxymethylcellulose (REFRESH PLUS) 0.5 % SOLN, Place 1 drop into both eyes daily as needed (dry eyes).,  Disp: , Rfl:  .  carvedilol (COREG) 25 MG tablet, Take 25 mg by mouth 2 (two) times daily with a meal. Reported on 12/05/2015, Disp: , Rfl:  .  Cinnamon 500 MG capsule, Take 500 mg by mouth daily., Disp: , Rfl:  .  Cod Liver Oil 1000 MG CAPS, Take 1 capsule by mouth daily. During the winter months., Disp: , Rfl:  .  Cyanocobalamin (VITAMIN B 12 PO), Take 1 tablet by mouth daily. , Disp: , Rfl:  .  ibuprofen (MOTRIN IB) 200 MG tablet, Take 1 tablet (200 mg total) by mouth every 6 (six) hours as needed for mild pain., Disp: 30 tablet, Rfl: 0 .  losartan-hydrochlorothiazide (HYZAAR) 100-12.5 MG tablet, Take 1 tablet by mouth daily., Disp: , Rfl:  .  Multiple Vitamins-Minerals (MULTIVITAMIN WITH MINERALS) tablet, Take 1 tablet by mouth daily., Disp: , Rfl:  .  Omega-3 Fatty Acids (FISH OIL) 1000 MG CAPS, Take by mouth. Reported on 12/05/2015, Disp: , Rfl:  .  potassium chloride SA (K-DUR,KLOR-CON) 20 MEQ tablet, Take 1 tablet (20 mEq total) by mouth daily., Disp: 3 tablet, Rfl: 0 .  pravastatin (PRAVACHOL) 40 MG tablet, , Disp: , Rfl:  .  psyllium (METAMUCIL) 58.6 % packet, Take 1 packet by mouth as needed (constipation)., Disp: , Rfl:  .  vitamin E 400 UNIT capsule, Take 400 Units by mouth daily.,  Disp: , Rfl:   Review of Systems:  Constitutional: Denies fever, chills, diaphoresis, appetite change and fatigue.  HEENT: Denies photophobia, eye pain, redness, hearing loss, ear pain, congestion, sore throat, rhinorrhea, sneezing, mouth sores, trouble swallowing, neck pain, neck stiffness and tinnitus.   Respiratory: Denies SOB, DOE, cough, chest tightness,  and wheezing.   Cardiovascular: Denies chest pain, palpitations and leg swelling.  Gastrointestinal: Denies nausea, vomiting, abdominal pain, diarrhea, constipation, blood in stool and abdominal distention.  Genitourinary: Denies dysuria, urgency, frequency, hematuria, flank pain and difficulty urinating.  Endocrine: Denies: hot or cold intolerance,  sweats, changes in hair or nails, polyuria, polydipsia. Musculoskeletal: Denies myalgias, back pain, joint swelling, arthralgias and gait problem.  Skin: Denies pallor, rash and wound.  Neurological: Denies dizziness, seizures, syncope, weakness, light-headedness, numbness and headaches.  Hematological: Denies adenopathy. Easy bruising, personal or family bleeding history  Psychiatric/Behavioral: Denies suicidal ideation, mood changes, confusion, nervousness, sleep disturbance and agitation    Physical Exam: Vitals:   07/31/18 1024  BP: 130/80  Pulse: 66  Temp: 98.8 F (37.1 C)  TempSrc: Oral  SpO2: 97%  Weight: 160 lb 11.2 oz (72.9 kg)  Height: 5' 2" (1.575 m)    Body mass index is 29.39 kg/m.   Constitutional: NAD, calm, comfortable Eyes: PERRL, lids and conjunctivae normal ENMT: Mucous membranes are moist. Posterior pharynx clear of any exudate or lesions. Normal dentition. Tympanic membrane is pearly white, no erythema or bulging. Neck: normal, supple, no masses, no thyromegaly Respiratory: clear to auscultation bilaterally, no wheezing, no crackles. Normal respiratory effort. No accessory muscle use.  Cardiovascular: Regular rate and rhythm, no murmurs / rubs / gallops. No extremity edema. 2+ pedal pulses. No carotid bruits.  Abdomen: no tenderness, no masses palpated. No hepatosplenomegaly. Bowel sounds positive.  Musculoskeletal: no clubbing / cyanosis. No joint deformity upper and lower extremities. Good ROM, no contractures. Normal muscle tone.  Skin: no rashes, lesions, ulcers. No induration Neurologic: CN 2-12 grossly intact. Sensation intact, DTR normal. Strength 5/5 in all 4.  Psychiatric: Normal judgment and insight. Alert and oriented x 3. Normal mood.    Subsequent Medicare wellness visit   1. Risk factors, based on past  M,S,F -cardiovascular risk factors include hypertension and hyperlipidemia.   2.  Physical activities: She is not very physically active,  she does some mild housework and maybe walks around her neighborhood 1-2 times a week   3.  Depression/mood:  Mood appears stable   4.  Hearing:  No issues with hearing   5.  ADL's: She remains independent in her ADLs   6.  Fall risk:  Low fall risk   7.  Home safety: No problems identified   8.  Height weight, and visual acuity: Height and weight documented in chart, she sees ophthalmologist every year for visual acuity   9.  Counseling:  Today we have discussed healthy lifestyle as pertains to reducing cardiovascular risk factors   10. Lab orders based on risk factors: Laboratory update will be reviewed   11. Referral :  None   12. Care plan:  Continue treatment of cardiovascular disease risk factors including hyperlipidemia and hypertension, treat other acute issues as they arise   13. Cognitive assessment:  Cognition appears intact   14. Screening: Patient provided with a written and personalized 5-10 year screening schedule in the AVS.   yes   15. Provider List Update:   PCP only    Impression and Plan:  Encounter for preventive health examination  -  Flu vaccine today, otherwise vaccinations are up-to-date. -She has routine eye and dental care. -She had a mammogram last year, she has otherwise aged out of routine cancer screening. -Lab work today. -Have discussed healthy lifestyle in detail.  Essential hypertension, benign  -Hypertension remains well controlled on carvedilol as well as losartan/hydrochlorothiazide.  Hyperlipidemia, unspecified hyperlipidemia type -Last LDL was 106 in August 2018, continue pravastatin.  Need for hepatitis C screening test - Plan: Hep C Antibody  Needs flu shot - Plan: Flu vaccine HIGH DOSE PF (Fluzone High dose)    Patient Instructions  -Nice seeing you today!!  -Lab work today, will notify you with results.  -Flu shot today.  -Schedule follow up in 6 months.   Preventive Care 94 Years and Older, Female Preventive care  refers to lifestyle choices and visits with your health care provider that can promote health and wellness. What does preventive care include?  A yearly physical exam. This is also called an annual well check.  Dental exams once or twice a year.  Routine eye exams. Ask your health care provider how often you should have your eyes checked.  Personal lifestyle choices, including: ? Daily care of your teeth and gums. ? Regular physical activity. ? Eating a healthy diet. ? Avoiding tobacco and drug use. ? Limiting alcohol use. ? Practicing safe sex. ? Taking low-dose aspirin every day. ? Taking vitamin and mineral supplements as recommended by your health care provider. What happens during an annual well check? The services and screenings done by your health care provider during your annual well check will depend on your age, overall health, lifestyle risk factors, and family history of disease. Counseling Your health care provider may ask you questions about your:  Alcohol use.  Tobacco use.  Drug use.  Emotional well-being.  Home and relationship well-being.  Sexual activity.  Eating habits.  History of falls.  Memory and ability to understand (cognition).  Work and work Statistician.  Reproductive health.  Screening You may have the following tests or measurements:  Height, weight, and BMI.  Blood pressure.  Lipid and cholesterol levels. These may be checked every 5 years, or more frequently if you are over 74 years old.  Skin check.  Lung cancer screening. You may have this screening every year starting at age 71 if you have a 30-pack-year history of smoking and currently smoke or have quit within the past 15 years.  Colorectal cancer screening. All adults should have this screening starting at age 58 and continuing until age 64. You will have tests every 1-10 years, depending on your results and the type of screening test. People at increased risk should  start screening at an earlier age. Screening tests may include: ? Guaiac-based fecal occult blood testing. ? Fecal immunochemical test (FIT). ? Stool DNA test. ? Virtual colonoscopy. ? Sigmoidoscopy. During this test, a flexible tube with a tiny camera (sigmoidoscope) is used to examine your rectum and lower colon. The sigmoidoscope is inserted through your anus into your rectum and lower colon. ? Colonoscopy. During this test, a long, thin, flexible tube with a tiny camera (colonoscope) is used to examine your entire colon and rectum.  Hepatitis C blood test.  Hepatitis B blood test.  Sexually transmitted disease (STD) testing.  Diabetes screening. This is done by checking your blood sugar (glucose) after you have not eaten for a while (fasting). You may have this done every 1-3 years.  Bone density scan. This is done to screen  for osteoporosis. You may have this done starting at age 53.  Mammogram. This may be done every 1-2 years. Talk to your health care provider about how often you should have regular mammograms. Talk with your health care provider about your test results, treatment options, and if necessary, the need for more tests. Vaccines Your health care provider may recommend certain vaccines, such as:  Influenza vaccine. This is recommended every year.  Tetanus, diphtheria, and acellular pertussis (Tdap, Td) vaccine. You may need a Td booster every 10 years.  Varicella vaccine. You may need this if you have not been vaccinated.  Zoster vaccine. You may need this after age 93.  Measles, mumps, and rubella (MMR) vaccine. You may need at least one dose of MMR if you were born in 1957 or later. You may also need a second dose.  Pneumococcal 13-valent conjugate (PCV13) vaccine. One dose is recommended after age 36.  Pneumococcal polysaccharide (PPSV23) vaccine. One dose is recommended after age 22.  Meningococcal vaccine. You may need this if you have certain conditions.   Hepatitis A vaccine. You may need this if you have certain conditions or if you travel or work in places where you may be exposed to hepatitis A.  Hepatitis B vaccine. You may need this if you have certain conditions or if you travel or work in places where you may be exposed to hepatitis B.  Haemophilus influenzae type b (Hib) vaccine. You may need this if you have certain conditions. Talk to your health care provider about which screenings and vaccines you need and how often you need them. This information is not intended to replace advice given to you by your health care provider. Make sure you discuss any questions you have with your health care provider. Document Released: 06/03/2015 Document Revised: 06/27/2017 Document Reviewed: 03/08/2015 Elsevier Interactive Patient Education  2019 [place], MD [facility]

## 2018-07-31 NOTE — Patient Instructions (Signed)
-Nice seeing you today!!  -Lab work today, will notify you with results.  -Flu shot today.  -Schedule follow up in 6 months.   Preventive Care 73 Years and Older, Female Preventive care refers to lifestyle choices and visits with your health care provider that can promote health and wellness. What does preventive care include?  A yearly physical exam. This is also called an annual well check.  Dental exams once or twice a year.  Routine eye exams. Ask your health care provider how often you should have your eyes checked.  Personal lifestyle choices, including: ? Daily care of your teeth and gums. ? Regular physical activity. ? Eating a healthy diet. ? Avoiding tobacco and drug use. ? Limiting alcohol use. ? Practicing safe sex. ? Taking low-dose aspirin every day. ? Taking vitamin and mineral supplements as recommended by your health care provider. What happens during an annual well check? The services and screenings done by your health care provider during your annual well check will depend on your age, overall health, lifestyle risk factors, and family history of disease. Counseling Your health care provider may ask you questions about your:  Alcohol use.  Tobacco use.  Drug use.  Emotional well-being.  Home and relationship well-being.  Sexual activity.  Eating habits.  History of falls.  Memory and ability to understand (cognition).  Work and work Statistician.  Reproductive health.  Screening You may have the following tests or measurements:  Height, weight, and BMI.  Blood pressure.  Lipid and cholesterol levels. These may be checked every 5 years, or more frequently if you are over 35 years old.  Skin check.  Lung cancer screening. You may have this screening every year starting at age 38 if you have a 30-pack-year history of smoking and currently smoke or have quit within the past 15 years.  Colorectal cancer screening. All adults should have  this screening starting at age 87 and continuing until age 91. You will have tests every 1-10 years, depending on your results and the type of screening test. People at increased risk should start screening at an earlier age. Screening tests may include: ? Guaiac-based fecal occult blood testing. ? Fecal immunochemical test (FIT). ? Stool DNA test. ? Virtual colonoscopy. ? Sigmoidoscopy. During this test, a flexible tube with a tiny camera (sigmoidoscope) is used to examine your rectum and lower colon. The sigmoidoscope is inserted through your anus into your rectum and lower colon. ? Colonoscopy. During this test, a long, thin, flexible tube with a tiny camera (colonoscope) is used to examine your entire colon and rectum.  Hepatitis C blood test.  Hepatitis B blood test.  Sexually transmitted disease (STD) testing.  Diabetes screening. This is done by checking your blood sugar (glucose) after you have not eaten for a while (fasting). You may have this done every 1-3 years.  Bone density scan. This is done to screen for osteoporosis. You may have this done starting at age 47.  Mammogram. This may be done every 1-2 years. Talk to your health care provider about how often you should have regular mammograms. Talk with your health care provider about your test results, treatment options, and if necessary, the need for more tests. Vaccines Your health care provider may recommend certain vaccines, such as:  Influenza vaccine. This is recommended every year.  Tetanus, diphtheria, and acellular pertussis (Tdap, Td) vaccine. You may need a Td booster every 10 years.  Varicella vaccine. You may need this if you  have not been vaccinated.  Zoster vaccine. You may need this after age 19.  Measles, mumps, and rubella (MMR) vaccine. You may need at least one dose of MMR if you were born in 1957 or later. You may also need a second dose.  Pneumococcal 13-valent conjugate (PCV13) vaccine. One dose is  recommended after age 38.  Pneumococcal polysaccharide (PPSV23) vaccine. One dose is recommended after age 2.  Meningococcal vaccine. You may need this if you have certain conditions.  Hepatitis A vaccine. You may need this if you have certain conditions or if you travel or work in places where you may be exposed to hepatitis A.  Hepatitis B vaccine. You may need this if you have certain conditions or if you travel or work in places where you may be exposed to hepatitis B.  Haemophilus influenzae type b (Hib) vaccine. You may need this if you have certain conditions. Talk to your health care provider about which screenings and vaccines you need and how often you need them. This information is not intended to replace advice given to you by your health care provider. Make sure you discuss any questions you have with your health care provider. Document Released: 06/03/2015 Document Revised: 06/27/2017 Document Reviewed: 03/08/2015 Elsevier Interactive Patient Education  2019 [employer].

## 2018-08-01 LAB — HEPATITIS C ANTIBODY
Hepatitis C Ab: NONREACTIVE
SIGNAL TO CUT-OFF: 0.01 (ref <1.00)

## 2018-10-01 ENCOUNTER — Telehealth: Payer: Self-pay | Admitting: *Deleted

## 2018-10-01 NOTE — Telephone Encounter (Signed)
Copied from [place] [id number]. Topic: General - Inquiry >> Sep 30, 2018  5:46 PM Rutherford Nail, NT wrote: Reason for CRM: Patient calling and states that she has been cold x 3 weeks or longer. States that she wears long sleeves, long pants, socks, and a house coat and is still cold. Denied having a fever. Would like to know what Dr Jerilee Hoh thinks could be the cause of her feeling cold all the time. CB#: [phone]

## 2018-10-01 NOTE — Telephone Encounter (Signed)
She recently had labs that looked good. As long as no fever, wear more layers of clothing or increase temp in house.

## 2018-10-02 NOTE — Telephone Encounter (Signed)
Patient is aware.  Patient is complaining of right hip pain.  Telephone visit scheduled for 10/03/2018.

## 2018-10-03 ENCOUNTER — Ambulatory Visit: Payer: Self-pay | Admitting: Internal Medicine

## 2018-10-17 ENCOUNTER — Telehealth: Payer: Self-pay | Admitting: Internal Medicine

## 2018-10-17 NOTE — Telephone Encounter (Signed)
Patient mailed in a disability placard  Call patient to pick up form at: [phone]   Disposition: Dr's Folder

## 2018-10-21 NOTE — Telephone Encounter (Signed)
Placed in Dr Hernandez's folder 

## 2018-10-23 NOTE — Telephone Encounter (Signed)
Attempted to call patient but voicemail is not set up.  Form is ready to pick up.

## 2018-10-25 ENCOUNTER — Other Ambulatory Visit: Payer: Self-pay | Admitting: Family Medicine

## 2018-10-25 DIAGNOSIS — Z20822 Contact with and (suspected) exposure to covid-19: Secondary | ICD-10-CM

## 2018-10-27 LAB — NOVEL CORONAVIRUS, NAA: SARS-CoV-2, NAA: NOT DETECTED

## 2018-10-28 ENCOUNTER — Telehealth: Payer: Self-pay | Admitting: Internal Medicine

## 2018-10-28 NOTE — Telephone Encounter (Signed)
Pt returning call for Covid results; negative. Reviewed with pt; verbalizes understanding.

## 2018-10-31 ENCOUNTER — Other Ambulatory Visit: Payer: Self-pay | Admitting: Internal Medicine

## 2018-10-31 DIAGNOSIS — Z1231 Encounter for screening mammogram for malignant neoplasm of breast: Secondary | ICD-10-CM

## 2019-01-20 ENCOUNTER — Other Ambulatory Visit: Payer: Self-pay | Admitting: Internal Medicine

## 2019-01-20 DIAGNOSIS — E876 Hypokalemia: Secondary | ICD-10-CM

## 2019-01-20 MED ORDER — LOSARTAN POTASSIUM-HCTZ 100-12.5 MG PO TABS: 1.0000 | ORAL_TABLET | Freq: Every day | ORAL | 1 refills | Status: DC

## 2019-01-20 MED ORDER — CARVEDILOL 25 MG PO TABS: 25.0000 mg | ORAL_TABLET | Freq: Two times a day (BID) | ORAL | 1 refills | Status: DC

## 2019-01-20 NOTE — Telephone Encounter (Signed)
Medication Refill: losartan-hydrochlorothiazide (HYZAAR) 100-12.5 MG tablet [id number]   carvedilol (COREG) 25 MG tablet [id number]    Pharmacy:  [facility] [address] [place] [phone] (Phone) [phone] (Fax)

## 2019-01-20 NOTE — Telephone Encounter (Signed)
Requested medication (s) are due for refill today: yes  Requested medication (s) are on the active medication list: yes  Last refill:   Future visit scheduled:no  Notes to clinic:  Review for refill   Requested Prescriptions  Pending Prescriptions Disp Refills   carvedilol (COREG) 25 MG tablet       Sig: Take 1 tablet (25 mg total) by mouth 2 (two) times daily with a meal. Reported on 12/05/2015     Cardiovascular:  Beta Blockers Passed - 01/20/2019 10:48 AM      Passed - Last BP in normal range    BP Readings from Last 1 Encounters:  07/31/18 130/80         Passed - Last Heart Rate in normal range    Pulse Readings from Last 1 Encounters:  07/31/18 66         Passed - Valid encounter within last 6 months    Recent Outpatient Visits          5 months ago Encounter for preventive health examination   [facility], Rayford Halsted, MD   6 months ago Essential hypertension, benign   Therap[facility], [facility] at [facility], Rayford Halsted, MD              losartan-hydrochlorothiazide (HYZAAR) 100-12.5 MG tablet       Sig: Take 1 tablet by mouth daily.     Cardiovascular: ARB + Diuretic Combos Failed - 01/20/2019 10:48 AM      Failed - K in normal range and within 180 days    Potassium  Date Value Ref Range Status  07/31/2018 3.3 (L) 3.5 - 5.1 mEq/L Final         Passed - Na in normal range and within 180 days    Sodium  Date Value Ref Range Status  07/31/2018 143 135 - 145 mEq/L Final         Passed - Cr in normal range and within 180 days    Creatinine, Ser  Date Value Ref Range Status  07/31/2018 0.84 0.40 - 1.20 mg/dL Final         Passed - Ca in normal range and within 180 days    Calcium  Date Value Ref Range Status  07/31/2018 9.6 8.4 - 10.5 mg/dL Final         Passed - Patient is not pregnant      Passed - Last BP in normal range    BP Readings from Last 1 Encounters:  07/31/18 130/80         Passed - Valid  encounter within last 6 months    Recent Outpatient Visits          5 months ago Encounter for preventive health examination   [facility], Rayford Halsted, MD   6 months ago Essential hypertension, benign   Therapist, [facility] at [facility], Rayford Halsted, MD

## 2019-01-22 ENCOUNTER — Telehealth: Payer: Self-pay

## 2019-01-22 NOTE — Telephone Encounter (Signed)
Copied from [place] [phone]. Topic: General - Other >> Jan 22, 2019  9:30 AM Leward Quan A wrote: Reason for CRM: Patient called to inquire from Dr Jerilee Hoh about her Rx for potassium chloride SA (K-DUR,KLOR-CON) 20 MEQ tablet states that all her other medication were filled except that one. She is requesting the Rx sent to her pharmacy please. Ph# [phone]

## 2019-01-23 NOTE — Telephone Encounter (Signed)
FYI

## 2019-01-23 NOTE — Telephone Encounter (Signed)
Looks like she only got a 5 day Rx in March when her K was low. Can we have her come in for a BMET? If K still low, will Rx, otherwise does not need it.

## 2019-01-23 NOTE — Telephone Encounter (Signed)
Pt called back and advised she is going out of town and will be gone several months. Will call back and schedule lab upon returning.

## 2019-03-23 ENCOUNTER — Other Ambulatory Visit: Payer: Self-pay

## 2019-03-23 ENCOUNTER — Ambulatory Visit
Admission: RE | Admit: 2019-03-23 | Discharge: 2019-03-23 | Disposition: A | Discharge status: Home / Self Care | Payer: Medicare Other | Source: Ambulatory Visit | Attending: Internal Medicine | Admitting: Internal Medicine

## 2019-03-23 DIAGNOSIS — Z1231 Encounter for screening mammogram for malignant neoplasm of breast: Secondary | ICD-10-CM

## 2019-03-25 ENCOUNTER — Ambulatory Visit: Payer: Medicare Other | Admitting: Internal Medicine

## 2019-05-29 DIAGNOSIS — M25511 Pain in right shoulder: Secondary | ICD-10-CM | POA: Diagnosis not present

## 2019-05-29 DIAGNOSIS — M5431 Sciatica, right side: Secondary | ICD-10-CM | POA: Diagnosis not present

## 2019-05-29 DIAGNOSIS — M25512 Pain in left shoulder: Secondary | ICD-10-CM | POA: Diagnosis not present

## 2019-05-29 DIAGNOSIS — Z6830 Body mass index (BMI) 30.0-30.9, adult: Secondary | ICD-10-CM | POA: Diagnosis not present

## 2019-05-29 DIAGNOSIS — M1712 Unilateral primary osteoarthritis, left knee: Secondary | ICD-10-CM | POA: Diagnosis not present

## 2019-05-29 DIAGNOSIS — R7301 Impaired fasting glucose: Secondary | ICD-10-CM | POA: Diagnosis not present

## 2019-07-16 ENCOUNTER — Ambulatory Visit: Payer: Medicare PPO | Attending: Internal Medicine

## 2019-07-16 DIAGNOSIS — Z23 Encounter for immunization: Secondary | ICD-10-CM

## 2019-07-16 NOTE — Progress Notes (Signed)
   Covid-19 Vaccination Clinic  Name:  [name]    MRN: [id number] DOB: August 07, 1945  07/16/2019  [name] was observed post Covid-19 immunization for 15 minutes without incidence. She was provided with Vaccine Information Sheet and instruction to access the V-Safe system.   [name] was instructed to call 911 with any severe reactions post vaccine: Marland Kitchen Difficulty breathing  . Swelling of your face and throat  . A fast heartbeat  . A bad rash all over your body  . Dizziness and weakness    Immunizations Administered    Name Date Dose VIS Date Route   Pfizer COVID-19 Vaccine 07/16/2019  3:27 PM 0.3 mL 05/01/2019 Intramuscular   Manufacturer: [place]   Lot: [id number]   [place]: [id number]

## 2019-07-17 DIAGNOSIS — H3561 Retinal hemorrhage, right eye: Secondary | ICD-10-CM | POA: Diagnosis not present

## 2019-07-17 DIAGNOSIS — H2512 Age-related nuclear cataract, left eye: Secondary | ICD-10-CM | POA: Diagnosis not present

## 2019-07-17 DIAGNOSIS — H524 Presbyopia: Secondary | ICD-10-CM | POA: Diagnosis not present

## 2019-08-11 ENCOUNTER — Ambulatory Visit: Payer: Medicare PPO | Attending: Internal Medicine

## 2019-08-11 DIAGNOSIS — Z23 Encounter for immunization: Secondary | ICD-10-CM

## 2019-08-11 NOTE — Progress Notes (Signed)
   Covid-19 Vaccination Clinic  Name:  [name]    MRN: [id number] DOB: September 16, 1945  08/11/2019  [name] was observed post Covid-19 immunization for 15 minutes without incident. She was provided with Vaccine Information Sheet and instruction to access the V-Safe system.   [name] was instructed to call 911 with any severe reactions post vaccine: Marland Kitchen Difficulty breathing  . Swelling of face and throat  . A fast heartbeat  . A bad rash all over body  . Dizziness and weakness   Immunizations Administered    Name Date Dose VIS Date Route   Pfizer COVID-19 Vaccine 08/11/2019  2:24 PM 0.3 mL 05/01/2019 Intramuscular   Manufacturer: [place]   Lot: [id number]   [place]: [id number]

## 2019-08-13 ENCOUNTER — Ambulatory Visit (INDEPENDENT_AMBULATORY_CARE_PROVIDER_SITE_OTHER): Payer: Medicare PPO | Admitting: Internal Medicine

## 2019-08-13 ENCOUNTER — Other Ambulatory Visit: Payer: Self-pay

## 2019-08-13 ENCOUNTER — Encounter: Payer: Self-pay | Admitting: Internal Medicine

## 2019-08-13 VITALS — BP 130/84 | HR 74 | Temp 97.7°F | Ht 62.25 in | Wt 165.2 lb

## 2019-08-13 DIAGNOSIS — E785 Hyperlipidemia, unspecified: Secondary | ICD-10-CM | POA: Diagnosis not present

## 2019-08-13 DIAGNOSIS — Z1211 Encounter for screening for malignant neoplasm of colon: Secondary | ICD-10-CM | POA: Diagnosis not present

## 2019-08-13 DIAGNOSIS — I10 Essential (primary) hypertension: Secondary | ICD-10-CM | POA: Diagnosis not present

## 2019-08-13 DIAGNOSIS — Z1231 Encounter for screening mammogram for malignant neoplasm of breast: Secondary | ICD-10-CM | POA: Diagnosis not present

## 2019-08-13 DIAGNOSIS — Z0000 Encounter for general adult medical examination without abnormal findings: Secondary | ICD-10-CM | POA: Diagnosis not present

## 2019-08-13 LAB — CBC WITH DIFFERENTIAL/PLATELET
Basophils Absolute: 0 10*3/uL (ref 0.0–0.1)
Basophils Relative: 0.7 % (ref 0.0–3.0)
Eosinophils Absolute: 0.2 10*3/uL (ref 0.0–0.7)
Eosinophils Relative: 5.2 % — ABNORMAL HIGH (ref 0.0–5.0)
HCT: 38.1 % (ref 36.0–46.0)
Hemoglobin: 12.8 g/dL (ref 12.0–15.0)
Lymphocytes Relative: 26.1 % (ref 12.0–46.0)
Lymphs Abs: 1.2 10*3/uL (ref 0.7–4.0)
MCHC: 33.5 g/dL (ref 30.0–36.0)
MCV: 91.9 fl (ref 78.0–100.0)
Monocytes Absolute: 0.6 10*3/uL (ref 0.1–1.0)
Monocytes Relative: 12.4 % — ABNORMAL HIGH (ref 3.0–12.0)
Neutro Abs: 2.5 10*3/uL (ref 1.4–7.7)
Neutrophils Relative %: 55.6 % (ref 43.0–77.0)
Platelets: 141 10*3/uL — ABNORMAL LOW (ref 150.0–400.0)
RBC: 4.14 Mil/uL (ref 3.87–5.11)
RDW: 14.5 % (ref 11.5–15.5)
WBC: 4.5 10*3/uL (ref 4.0–10.5)

## 2019-08-13 LAB — COMPREHENSIVE METABOLIC PANEL
ALT: 23 U/L (ref 0–35)
AST: 23 U/L (ref 0–37)
Albumin: 4.1 g/dL (ref 3.5–5.2)
Alkaline Phosphatase: 58 U/L (ref 39–117)
BUN: 16 mg/dL (ref 6–23)
CO2: 32 mEq/L (ref 19–32)
Calcium: 9.3 mg/dL (ref 8.4–10.5)
Chloride: 103 mEq/L (ref 96–112)
Creatinine, Ser: 0.84 mg/dL (ref 0.40–1.20)
GFR: 80.33 mL/min (ref >60.00)
Glucose, Bld: 99 mg/dL (ref 70–99)
Potassium: 3.3 mEq/L — ABNORMAL LOW (ref 3.5–5.1)
Sodium: 142 mEq/L (ref 135–145)
Total Bilirubin: 1.3 mg/dL — ABNORMAL HIGH (ref 0.2–1.2)
Total Protein: 7 g/dL (ref 6.0–8.3)

## 2019-08-13 LAB — LIPID PANEL
Cholesterol: 170 mg/dL (ref 0–200)
HDL: 58.3 mg/dL (ref >39.00)
LDL Cholesterol: 95 mg/dL (ref 0–99)
NonHDL: 111.85
Total CHOL/HDL Ratio: 3
Triglycerides: 85 mg/dL (ref 0.0–149.0)
VLDL: 17 mg/dL (ref 0.0–40.0)

## 2019-08-13 LAB — TSH: TSH: 2 u[IU]/mL (ref 0.35–4.50)

## 2019-08-13 LAB — VITAMIN D 25 HYDROXY (VIT D DEFICIENCY, FRACTURES): VITD: 30.65 ng/mL (ref 30.00–100.00)

## 2019-08-13 LAB — VITAMIN B12: Vitamin B-12: >1500 pg/mL — ABNORMAL HIGH (ref 211–911)

## 2019-08-13 LAB — HEMOGLOBIN A1C: Hgb A1c MFr Bld: 5.9 % (ref 4.6–6.5)

## 2019-08-13 MED ORDER — LOSARTAN POTASSIUM-HCTZ 100-12.5 MG PO TABS: 1.0000 | ORAL_TABLET | Freq: Every day | ORAL | 1 refills | Status: DC

## 2019-08-13 MED ORDER — PRAVASTATIN SODIUM 40 MG PO TABS: 40.0000 mg | ORAL_TABLET | Freq: Every day | ORAL | 1 refills | Status: DC

## 2019-08-13 NOTE — Patient Instructions (Signed)
-Nice seeing you today!!  -Lab work today; will notify you once results are available.  -Mammogram and colonoscopy to be requested today.  -Shingles and Prevnar vaccines at your pharmacy; 6 weeks after your last COVID vaccine.  -Schedule follow up in 6 months.   Preventive Care 74 Years and Older, Female Preventive care refers to lifestyle choices and visits with your health care provider that can promote health and wellness. This includes:  A yearly physical exam. This is also called an annual well check.  Regular dental and eye exams.  Immunizations.  Screening for certain conditions.  Healthy lifestyle choices, such as diet and exercise. What can I expect for my preventive care visit? Physical exam Your health care provider will check:  Height and weight. These may be used to calculate body mass index (BMI), which is a measurement that tells if you are at a healthy weight.  Heart rate and blood pressure.  Your skin for abnormal spots. Counseling Your health care provider may ask you questions about:  Alcohol, tobacco, and drug use.  Emotional well-being.  Home and relationship well-being.  Sexual activity.  Eating habits.  History of falls.  Memory and ability to understand (cognition).  Work and work Statistician.  Pregnancy and menstrual history. What immunizations do I need?  Influenza (flu) vaccine  This is recommended every year. Tetanus, diphtheria, and pertussis (Tdap) vaccine  You may need a Td booster every 10 years. Varicella (chickenpox) vaccine  You may need this vaccine if you have not already been vaccinated. Zoster (shingles) vaccine  You may need this after age 52. Pneumococcal conjugate (PCV13) vaccine  One dose is recommended after age 64. Pneumococcal polysaccharide (PPSV23) vaccine  One dose is recommended after age 30. Measles, mumps, and rubella (MMR) vaccine  You may need at least one dose of MMR if you were born in  1957 or later. You may also need a second dose. Meningococcal conjugate (MenACWY) vaccine  You may need this if you have certain conditions. Hepatitis A vaccine  You may need this if you have certain conditions or if you travel or work in places where you may be exposed to hepatitis A. Hepatitis B vaccine  You may need this if you have certain conditions or if you travel or work in places where you may be exposed to hepatitis B. Haemophilus influenzae type b (Hib) vaccine  You may need this if you have certain conditions. You may receive vaccines as individual doses or as more than one vaccine together in one shot (combination vaccines). Talk with your health care provider about the risks and benefits of combination vaccines. What tests do I need? Blood tests  Lipid and cholesterol levels. These may be checked every 5 years, or more frequently depending on your overall health.  Hepatitis C test.  Hepatitis B test. Screening  Lung cancer screening. You may have this screening every year starting at age 58 if you have a 30-pack-year history of smoking and currently smoke or have quit within the past 15 years.  Colorectal cancer screening. All adults should have this screening starting at age 47 and continuing until age 71. Your health care provider may recommend screening at age 56 if you are at increased risk. You will have tests every 1-10 years, depending on your results and the type of screening test.  Diabetes screening. This is done by checking your blood sugar (glucose) after you have not eaten for a while (fasting). You may have this done  every 1-3 years.  Mammogram. This may be done every 1-2 years. Talk with your health care provider about how often you should have regular mammograms.  BRCA-related cancer screening. This may be done if you have a family history of breast, ovarian, tubal, or peritoneal cancers. Other tests  Sexually transmitted disease (STD) testing.  Bone  density scan. This is done to screen for osteoporosis. You may have this done starting at age 58. Follow these instructions at home: Eating and drinking  Eat a diet that includes fresh fruits and vegetables, whole grains, lean protein, and low-fat dairy products. Limit your intake of foods with high amounts of sugar, saturated fats, and salt.  Take vitamin and mineral supplements as recommended by your health care provider.  Do not drink alcohol if your health care provider tells you not to drink.  If you drink alcohol: ? Limit how much you have to 0-1 drink a day. ? Be aware of how much alcohol is in your drink. In the [place] one drink equals one 12 oz bottle of beer (355 mL), one 5 oz glass of wine (148 mL), or one 1 oz glass of hard liquor (44 mL). Lifestyle  Take daily care of your teeth and gums.  Stay active. Exercise for at least 30 minutes on 5 or more days each week.  Do not use any products that contain nicotine or tobacco, such as cigarettes, e-cigarettes, and chewing tobacco. If you need help quitting, ask your health care provider.  If you are sexually active, practice safe sex. Use a condom or other form of protection in order to prevent STIs (sexually transmitted infections).  Talk with your health care provider about taking a low-dose aspirin or statin. What's next?  Go to your health care provider once a year for a well check visit.  Ask your health care provider how often you should have your eyes and teeth checked.  Stay up to date on all vaccines. This information is not intended to replace advice given to you by your health care provider. Make sure you discuss any questions you have with your health care provider. Document Revised: 05/01/2018 Document Reviewed: 05/01/2018 Elsevier Patient Education  2020 [employer].

## 2019-08-13 NOTE — Progress Notes (Signed)
   Established Patient Office Visit     This visit occurred during the SARS-CoV-2 public health emergency.  Safety protocols were in place, including screening questions prior to the visit, additional usage of staff PPE, and extensive cleaning of exam room while observing appropriate contact time as indicated for disinfecting solutions.    CC/Reason for Visit: Annual preventive exam and subsequent Medicare wellness visit  HPI: [name] is a 74 y.o. female who is coming in today for the above mentioned reasons. Past Medical History is significant for: Well-controlled hypertension and hyperlipidemia.  She has done well over the past year.  She has had some mild arthritis which has been well controlled with Aspercreme.  She completed both Covid vaccines.  She is also completed both shingles vaccines.  She is due for mammogram and colonoscopy.  She has routine eye and dental care.   Past Medical/Surgical History: Past Medical History:  Diagnosis Date  . Hyperlipidemia   . Hypertension     Past Surgical History:  Procedure Laterality Date  . LAPAROSCOPIC LYSIS OF ADHESIONS  09/08/2017  . LAPAROSCOPY N/A 09/08/2017   Procedure: LYSIS OF ADHESIONS LAPAROSCOPIC;  Surgeon: Kinsinger, Luke Aaron, MD;  Location: [facility] OR;  Service: General;  Laterality: N/A;  . REPLACEMENT TOTAL KNEE Left     Social History:  reports that she has never smoked. She has never used smokeless tobacco. She reports that she does not drink alcohol or use drugs.  Allergies: No Known Allergies  Family History:  Family History  Problem Relation Age of Onset  . Cancer Mother        STOMACH  . Heart disease Father   . Diabetes Sister   . Diabetes Brother      Current Outpatient Medications:  .  acetaminophen (TYLENOL) 325 MG tablet, Take 2 tablets (650 mg total) by mouth every 6 (six) hours as needed for mild pain (or temp > 100)., Disp: , Rfl:  .  aspirin 81 MG tablet, Take 81 mg by mouth  daily., Disp: , Rfl:  .  calcium carbonate (OS-CAL) 600 MG TABS tablet, Take 600 mg by mouth daily. , Disp: , Rfl:  .  carboxymethylcellulose (REFRESH PLUS) 0.5 % SOLN, Place 1 drop into both eyes daily as needed (dry eyes)., Disp: , Rfl:  .  carvedilol (COREG) 25 MG tablet, Take 1 tablet (25 mg total) by mouth 2 (two) times daily with a meal. Reported on 12/05/2015, Disp: 180 tablet, Rfl: 1 .  Cinnamon 500 MG capsule, Take 500 mg by mouth daily., Disp: , Rfl:  .  Cod Liver Oil 1000 MG CAPS, Take 1 capsule by mouth daily. During the winter months., Disp: , Rfl:  .  Cyanocobalamin (VITAMIN B 12 PO), Take 1 tablet by mouth daily. , Disp: , Rfl:  .  ibuprofen (MOTRIN IB) 200 MG tablet, Take 1 tablet (200 mg total) by mouth every 6 (six) hours as needed for mild pain., Disp: 30 tablet, Rfl: 0 .  losartan-hydrochlorothiazide (HYZAAR) 100-12.5 MG tablet, Take 1 tablet by mouth daily., Disp: 90 tablet, Rfl: 1 .  Multiple Vitamins-Minerals (MULTIVITAMIN WITH MINERALS) tablet, Take 1 tablet by mouth daily., Disp: , Rfl:  .  Omega-3 Fatty Acids (FISH OIL) 1000 MG CAPS, Take by mouth. Reported on 12/05/2015, Disp: , Rfl:  .  potassium chloride SA (K-DUR,KLOR-CON) 20 MEQ tablet, Take 1 tablet (20 mEq total) by mouth daily., Disp: 3 tablet, Rfl: 0 .  pravastatin (PRAVACHOL) 40 MG tablet, Take   1 tablet (40 mg total) by mouth daily., Disp: 90 tablet, Rfl: 1 .  psyllium (METAMUCIL) 58.6 % packet, Take 1 packet by mouth as needed (constipation)., Disp: , Rfl:  .  vitamin E 400 UNIT capsule, Take 400 Units by mouth daily., Disp: , Rfl:   Review of Systems:  Constitutional: Denies fever, chills, diaphoresis, appetite change and fatigue.  HEENT: Denies photophobia, eye pain, redness, hearing loss, ear pain, congestion, sore throat, rhinorrhea, sneezing, mouth sores, trouble swallowing, neck pain, neck stiffness and tinnitus.   Respiratory: Denies SOB, DOE, cough, chest tightness,  and wheezing.   Cardiovascular:  Denies chest pain, palpitations and leg swelling.  Gastrointestinal: Denies nausea, vomiting, abdominal pain, diarrhea, constipation, blood in stool and abdominal distention.  Genitourinary: Denies dysuria, urgency, frequency, hematuria, flank pain and difficulty urinating.  Endocrine: Denies: hot or cold intolerance, sweats, changes in hair or nails, polyuria, polydipsia. Musculoskeletal: Denies myalgias, back pain, joint swelling, arthralgias and gait problem.  Skin: Denies pallor, rash and wound.  Neurological: Denies dizziness, seizures, syncope, weakness, light-headedness, numbness and headaches.  Hematological: Denies adenopathy. Easy bruising, personal or family bleeding history  Psychiatric/Behavioral: Denies suicidal ideation, mood changes, confusion, nervousness, sleep disturbance and agitation    Physical Exam: Vitals:   08/13/19 0845  BP: 130/84  Pulse: 74  Temp: 97.7 F (36.5 C)  TempSrc: Temporal  SpO2: 98%  Weight: 165 lb 3.2 oz (74.9 kg)  Height: 5' 2.25" (1.581 m)    Body mass index is 29.97 kg/m.   Constitutional: NAD, calm, comfortable Eyes: PERRL, lids and conjunctivae normal ENMT: Mucous membranes are moist.  Tympanic membrane is pearly white, no erythema or bulging. Neck: normal, supple, no masses, no thyromegaly Respiratory: clear to auscultation bilaterally, no wheezing, no crackles. Normal respiratory effort. No accessory muscle use.  Cardiovascular: Regular rate and rhythm, no murmurs / rubs / gallops. No extremity edema. 2+ pedal pulses. No carotid bruits.  Abdomen: no tenderness, no masses palpated. No hepatosplenomegaly. Bowel sounds positive.  Musculoskeletal: no clubbing / cyanosis. No joint deformity upper and lower extremities. Good ROM, no contractures. Normal muscle tone.  Skin: no rashes, lesions, ulcers. No induration Neurologic: CN 2-12 grossly intact. Sensation intact, DTR normal. Strength 5/5 in all 4.  Psychiatric: Normal judgment and  insight. Alert and oriented x 3. Normal mood.   Subsequent Medicare wellness visit   1. Risk factors, based on past  M,S,F -cardiovascular disease risk factors include age, history of hypertension, history of hyperlipidemia   2.  Physical activities: She walks an hour every day   3.  Depression/mood:  Stable, not depressed   4.  Hearing:  No perceived issues   5.  ADL's: Independent in all ADLs   6.  Fall risk:  Low fall risk   7.  Home safety: No problems identified   8.  Height weight, and visual acuity: Height and weight as above, visual acuity is 20/32 with each eye independently and together   9.  Counseling:  We have discussed healthy lifestyle   10. Lab orders based on risk factors: Laboratory update will be reviewed   11. Referral :  None today   12. Care plan:  Follow-up in 6 months   13. Cognitive assessment:  No cognitive impairment identified   14. Screening: Patient provided with a written and personalized 5-10 year screening schedule in the AVS.   yes   15. Provider List Update:   PCP only  16. Advance Directives: Full code       Office Visit from 08/13/2019 in [facility]  PHQ-9 Total Score  0      Fall Risk  08/13/2019 07/31/2018 07/15/2018  Falls in the past year? 0 0 0  Number falls in past yr: 0 0 0  Injury with Fall? 0 0 0     Impression and Plan:  Encounter for preventive health examination -She has routine eye and dental care. -She is due for a Tdap booster and for Prevnar, will hold off on these today as I would like to wait 6 weeks after she completes her Covid series. -Screening labs today. -Healthy lifestyle has been discussed in detail. -Colonoscopy to be scheduled today as she is overdue. -She is overdue for mammogram, to be requested today. -She has elected to no longer pursue Pap smears for cervical cancer screening due to her age.  Encounter for screening mammogram for malignant neoplasm of breast  - Plan: MM  Digital Screening  Screening for malignant neoplasm of colon - Plan: Ambulatory referral to Gastroenterology  Essential hypertension, benign  -Well-controlled on current regimen stop  Hyperlipidemia, unspecified hyperlipidemia type -Last LDL was 116 in March 2020. -Check lipids today. -Refill pravastatin.    Patient Instructions  -Nice seeing you today!!  -Lab work today; will notify you once results are available.  -Mammogram and colonoscopy to be requested today.  -Shingles and Prevnar vaccines at your pharmacy; 6 weeks after your last COVID vaccine.  -Schedule follow up in 6 months.   Preventive Care 69 Years and Older, Female Preventive care refers to lifestyle choices and visits with your health care provider that can promote health and wellness. This includes:  A yearly physical exam. This is also called an annual well check.  Regular dental and eye exams.  Immunizations.  Screening for certain conditions.  Healthy lifestyle choices, such as diet and exercise. What can I expect for my preventive care visit? Physical exam Your health care provider will check:  Height and weight. These may be used to calculate body mass index (BMI), which is a measurement that tells if you are at a healthy weight.  Heart rate and blood pressure.  Your skin for abnormal spots. Counseling Your health care provider may ask you questions about:  Alcohol, tobacco, and drug use.  Emotional well-being.  Home and relationship well-being.  Sexual activity.  Eating habits.  History of falls.  Memory and ability to understand (cognition).  Work and work Statistician.  Pregnancy and menstrual history. What immunizations do I need?  Influenza (flu) vaccine  This is recommended every year. Tetanus, diphtheria, and pertussis (Tdap) vaccine  You may need a Td booster every 10 years. Varicella (chickenpox) vaccine  You may need this vaccine if you have not already been  vaccinated. Zoster (shingles) vaccine  You may need this after age 35. Pneumococcal conjugate (PCV13) vaccine  One dose is recommended after age 69. Pneumococcal polysaccharide (PPSV23) vaccine  One dose is recommended after age 49. Measles, mumps, and rubella (MMR) vaccine  You may need at least one dose of MMR if you were born in 1957 or later. You may also need a second dose. Meningococcal conjugate (MenACWY) vaccine  You may need this if you have certain conditions. Hepatitis A vaccine  You may need this if you have certain conditions or if you travel or work in places where you may be exposed to hepatitis A. Hepatitis B vaccine  You may need this if you have certain conditions or if you travel  or work in places where you may be exposed to hepatitis B. Haemophilus influenzae type b (Hib) vaccine  You may need this if you have certain conditions. You may receive vaccines as individual doses or as more than one vaccine together in one shot (combination vaccines). Talk with your health care provider about the risks and benefits of combination vaccines. What tests do I need? Blood tests  Lipid and cholesterol levels. These may be checked every 5 years, or more frequently depending on your overall health.  Hepatitis C test.  Hepatitis B test. Screening  Lung cancer screening. You may have this screening every year starting at age 31 if you have a 30-pack-year history of smoking and currently smoke or have quit within the past 15 years.  Colorectal cancer screening. All adults should have this screening starting at age 64 and continuing until age 64. Your health care provider may recommend screening at age 72 if you are at increased risk. You will have tests every 1-10 years, depending on your results and the type of screening test.  Diabetes screening. This is done by checking your blood sugar (glucose) after you have not eaten for a while (fasting). You may have this done  every 1-3 years.  Mammogram. This may be done every 1-2 years. Talk with your health care provider about how often you should have regular mammograms.  BRCA-related cancer screening. This may be done if you have a family history of breast, ovarian, tubal, or peritoneal cancers. Other tests  Sexually transmitted disease (STD) testing.  Bone density scan. This is done to screen for osteoporosis. You may have this done starting at age 48. Follow these instructions at home: Eating and drinking  Eat a diet that includes fresh fruits and vegetables, whole grains, lean protein, and low-fat dairy products. Limit your intake of foods with high amounts of sugar, saturated fats, and salt.  Take vitamin and mineral supplements as recommended by your health care provider.  Do not drink alcohol if your health care provider tells you not to drink.  If you drink alcohol: ? Limit how much you have to 0-1 drink a day. ? Be aware of how much alcohol is in your drink. In the [place] one drink equals one 12 oz bottle of beer (355 mL), one 5 oz glass of wine (148 mL), or one 1 oz glass of hard liquor (44 mL). Lifestyle  Take daily care of your teeth and gums.  Stay active. Exercise for at least 30 minutes on 5 or more days each week.  Do not use any products that contain nicotine or tobacco, such as cigarettes, e-cigarettes, and chewing tobacco. If you need help quitting, ask your health care provider.  If you are sexually active, practice safe sex. Use a condom or other form of protection in order to prevent STIs (sexually transmitted infections).  Talk with your health care provider about taking a low-dose aspirin or statin. What's next?  Go to your health care provider once a year for a well check visit.  Ask your health care provider how often you should have your eyes and teeth checked.  Stay up to date on all vaccines. This information is not intended to replace advice given to you by your  health care provider. Make sure you discuss any questions you have with your health care provider. Document Revised: 05/01/2018 Document Reviewed: 05/01/2018 Elsevier Patient Education  2020 [place], [place] [facility]

## 2019-08-17 ENCOUNTER — Telehealth: Payer: Self-pay | Admitting: Internal Medicine

## 2019-08-17 NOTE — Telephone Encounter (Signed)
Pt called to confirm she is due for a colonoscopy and would like a referral to have one done.

## 2019-08-17 NOTE — Telephone Encounter (Signed)
Per notes in the referral to GI from 3/25, [facility] left a message for the pt to return their call.  I advised the pt call [phone] for appt info and she agreed.

## 2019-08-19 ENCOUNTER — Telehealth: Payer: Self-pay | Admitting: Internal Medicine

## 2019-08-19 NOTE — Telephone Encounter (Signed)
Pt is requesting to see a kidney specialist, would like a call back to discuss.

## 2019-08-19 NOTE — Telephone Encounter (Signed)
Left message on machine returning patient's call. Need to know why she would like a referral.  Okay to schedule an office visit or virtual visit.

## 2019-08-20 NOTE — Telephone Encounter (Signed)
Spoke with patient and she is spending time with her daughter and she will see a nephrologist there.

## 2019-11-04 ENCOUNTER — Other Ambulatory Visit: Payer: Self-pay | Admitting: Internal Medicine

## 2019-12-02 ENCOUNTER — Other Ambulatory Visit: Payer: Self-pay | Admitting: Internal Medicine

## 2019-12-02 DIAGNOSIS — E785 Hyperlipidemia, unspecified: Secondary | ICD-10-CM

## 2019-12-02 DIAGNOSIS — I10 Essential (primary) hypertension: Secondary | ICD-10-CM

## 2020-02-19 ENCOUNTER — Other Ambulatory Visit: Payer: Self-pay | Admitting: Internal Medicine

## 2020-02-19 DIAGNOSIS — Z1231 Encounter for screening mammogram for malignant neoplasm of breast: Secondary | ICD-10-CM

## 2020-03-29 ENCOUNTER — Other Ambulatory Visit: Payer: Self-pay

## 2020-03-29 ENCOUNTER — Ambulatory Visit
Admission: RE | Admit: 2020-03-29 | Discharge: 2020-03-29 | Disposition: A | Discharge status: Home / Self Care | Payer: Medicare PPO | Source: Ambulatory Visit | Attending: Internal Medicine | Admitting: Internal Medicine

## 2020-03-29 DIAGNOSIS — Z1231 Encounter for screening mammogram for malignant neoplasm of breast: Secondary | ICD-10-CM

## 2020-06-29 ENCOUNTER — Encounter: Payer: Self-pay | Admitting: Internal Medicine

## 2020-06-29 ENCOUNTER — Ambulatory Visit (INDEPENDENT_AMBULATORY_CARE_PROVIDER_SITE_OTHER): Payer: Medicare PPO | Admitting: Internal Medicine

## 2020-06-29 ENCOUNTER — Other Ambulatory Visit: Payer: Self-pay

## 2020-06-29 VITALS — BP 148/86 | HR 83 | Temp 99.0°F | Ht 62.5 in | Wt 143.6 lb

## 2020-06-29 DIAGNOSIS — E876 Hypokalemia: Secondary | ICD-10-CM | POA: Diagnosis not present

## 2020-06-29 DIAGNOSIS — I517 Cardiomegaly: Secondary | ICD-10-CM

## 2020-06-29 DIAGNOSIS — E785 Hyperlipidemia, unspecified: Secondary | ICD-10-CM

## 2020-06-29 DIAGNOSIS — M8949 Other hypertrophic osteoarthropathy, multiple sites: Secondary | ICD-10-CM | POA: Diagnosis not present

## 2020-06-29 DIAGNOSIS — D539 Nutritional anemia, unspecified: Secondary | ICD-10-CM

## 2020-06-29 DIAGNOSIS — D696 Thrombocytopenia, unspecified: Secondary | ICD-10-CM

## 2020-06-29 DIAGNOSIS — M159 Polyosteoarthritis, unspecified: Secondary | ICD-10-CM

## 2020-06-29 DIAGNOSIS — I10 Essential (primary) hypertension: Secondary | ICD-10-CM | POA: Diagnosis not present

## 2020-06-29 HISTORY — DX: Hyperlipidemia, unspecified: E78.5

## 2020-06-29 HISTORY — DX: Hypokalemia: E87.6

## 2020-06-29 HISTORY — DX: Nutritional anemia, unspecified: D53.9

## 2020-06-29 HISTORY — DX: Cardiomegaly: I51.7

## 2020-06-29 LAB — LIPID PANEL
Cholesterol: 179 mg/dL (ref 0–200)
HDL: 53.6 mg/dL (ref >39.00)
LDL Cholesterol: 106 mg/dL — ABNORMAL HIGH (ref 0–99)
NonHDL: 124.95
Total CHOL/HDL Ratio: 3
Triglycerides: 96 mg/dL (ref 0.0–149.0)
VLDL: 19.2 mg/dL (ref 0.0–40.0)

## 2020-06-29 LAB — CBC WITH DIFFERENTIAL/PLATELET
Basophils Absolute: 0 10*3/uL (ref 0.0–0.1)
Basophils Relative: 0.5 % (ref 0.0–3.0)
Eosinophils Absolute: 0.1 10*3/uL (ref 0.0–0.7)
Eosinophils Relative: 1.8 % (ref 0.0–5.0)
HCT: 34.7 % — ABNORMAL LOW (ref 36.0–46.0)
Hemoglobin: 11.6 g/dL — ABNORMAL LOW (ref 12.0–15.0)
Lymphocytes Relative: 42.9 % (ref 12.0–46.0)
Lymphs Abs: 1.8 10*3/uL (ref 0.7–4.0)
MCHC: 33.5 g/dL (ref 30.0–36.0)
MCV: 92 fl (ref 78.0–100.0)
Monocytes Absolute: 0.3 10*3/uL (ref 0.1–1.0)
Monocytes Relative: 8.1 % (ref 3.0–12.0)
Neutro Abs: 2 10*3/uL (ref 1.4–7.7)
Neutrophils Relative %: 46.7 % (ref 43.0–77.0)
Platelets: 171 10*3/uL (ref 150.0–400.0)
RBC: 3.77 Mil/uL — ABNORMAL LOW (ref 3.87–5.11)
RDW: 13.9 % (ref 11.5–15.5)
WBC: 4.3 10*3/uL (ref 4.0–10.5)

## 2020-06-29 LAB — HEPATIC FUNCTION PANEL
ALT: 18 U/L (ref 0–35)
AST: 27 U/L (ref 0–37)
Albumin: 3.8 g/dL (ref 3.5–5.2)
Alkaline Phosphatase: 58 U/L (ref 39–117)
Bilirubin, Direct: 0.1 mg/dL (ref 0.0–0.3)
Total Bilirubin: 0.9 mg/dL (ref 0.2–1.2)
Total Protein: 7.1 g/dL (ref 6.0–8.3)

## 2020-06-29 LAB — BASIC METABOLIC PANEL
BUN: 17 mg/dL (ref 6–23)
CO2: 33 mEq/L — ABNORMAL HIGH (ref 19–32)
Calcium: 9.5 mg/dL (ref 8.4–10.5)
Chloride: 104 mEq/L (ref 96–112)
Creatinine, Ser: 0.74 mg/dL (ref 0.40–1.20)
GFR: 79.79 mL/min (ref >60.00)
Glucose, Bld: 101 mg/dL — ABNORMAL HIGH (ref 70–99)
Potassium: 3.3 mEq/L — ABNORMAL LOW (ref 3.5–5.1)
Sodium: 143 mEq/L (ref 135–145)

## 2020-06-29 LAB — CK: Total CK: 86 U/L (ref 7–177)

## 2020-06-29 LAB — FOLATE: Folate: >23.6 ng/mL (ref >5.9)

## 2020-06-29 LAB — MAGNESIUM: Magnesium: 2 mg/dL (ref 1.5–2.5)

## 2020-06-29 LAB — C-REACTIVE PROTEIN: CRP: <1 mg/dL (ref 0.5–20.0)

## 2020-06-29 LAB — VITAMIN B12: Vitamin B-12: 945 pg/mL — ABNORMAL HIGH (ref 211–911)

## 2020-06-29 MED ORDER — SPIRONOLACTONE 25 MG PO TABS: 25.0000 mg | ORAL_TABLET | Freq: Every day | ORAL | 3 refills | Status: DC

## 2020-06-29 MED ORDER — PRAVASTATIN SODIUM 40 MG PO TABS: 40.0000 mg | ORAL_TABLET | Freq: Every day | ORAL | 1 refills | Status: DC

## 2020-06-29 MED ORDER — CARVEDILOL 3.125 MG PO TABS: 3.1250 mg | ORAL_TABLET | Freq: Two times a day (BID) | ORAL | 0 refills | Status: DC

## 2020-06-29 MED ORDER — POTASSIUM CHLORIDE CRYS ER 20 MEQ PO TBCR: 20.0000 meq | EXTENDED_RELEASE_TABLET | Freq: Every day | ORAL | 0 refills | Status: DC

## 2020-06-29 NOTE — Patient Instructions (Signed)

## 2020-06-29 NOTE — Progress Notes (Signed)
Subjective:  Patient ID: [name], female    DOB: 12/12/1945  Age: 75 y.o. MRN: [id number]  CC: Hypertension  NEW TO ME  HPI Quinci Gavidia presents for f/up and to establish - She is not a good historian and tells me she thinks she is taking 2 medications but she does not know which ones they are.  According to prescription refills she would have run out of her medications months ago.  She complains of pain in her large joints and gets symptom relief with Tylenol.  She describes the pain as sharp and increases with activity.  This started about 3 months ago.  She tells me none of her joints get red or swollen.  She is active and denies CP, DOE, palpitations, edema, or fatigue.    History Shron has a past medical history of Hyperlipidemia and Hypertension.   She has a past surgical history that includes Replacement total knee (Left); laparoscopy (N/A, 09/08/2017); and Laparoscopic lysis of adhesions (09/08/2017).   Her family history includes Cancer in her mother; Diabetes in her brother and sister; Heart disease in her father.She reports that she has never smoked. She has never used smokeless tobacco. She reports that she does not drink alcohol and does not use drugs.  Outpatient Medications Prior to Visit  Medication Sig Dispense Refill  . acetaminophen (TYLENOL) 325 MG tablet Take 2 tablets (650 mg total) by mouth every 6 (six) hours as needed for mild pain (or temp > 100).    . Cyanocobalamin (VITAMIN B 12 PO) Take 1 tablet by mouth daily.     . Omega-3 Fatty Acids (FISH OIL) 1000 MG CAPS Take by mouth. Reported on 12/05/2015    . aspirin 81 MG tablet Take 81 mg by mouth daily.    . calcium carbonate (OS-CAL) 600 MG TABS tablet Take 600 mg by mouth daily.     . carboxymethylcellulose (REFRESH PLUS) 0.5 % SOLN Place 1 drop into both eyes daily as needed (dry eyes).    . carvedilol (COREG) 25 MG tablet TAKE ONE TABLET BY MOUTH TWICE A DAY WITH A MEAL 180 tablet 0   . Cinnamon 500 MG capsule Take 500 mg by mouth daily.    Marland Kitchen losartan-hydrochlorothiazide (HYZAAR) 100-12.5 MG tablet TAKE ONE TABLET BY MOUTH DAILY 90 tablet 1  . potassium chloride SA (K-DUR,KLOR-CON) 20 MEQ tablet Take 1 tablet (20 mEq total) by mouth daily. 3 tablet 0  . pravastatin (PRAVACHOL) 40 MG tablet TAKE ONE TABLET BY MOUTH DAILY 90 tablet 1  . vitamin E 400 UNIT capsule Take 400 Units by mouth daily.    Marland Kitchen Cod Liver Oil 1000 MG CAPS Take 1 capsule by mouth daily. During the winter months. (Patient not taking: Reported on 06/29/2020)    . ibuprofen (MOTRIN IB) 200 MG tablet Take 1 tablet (200 mg total) by mouth every 6 (six) hours as needed for mild pain. (Patient not taking: Reported on 06/29/2020) 30 tablet 0  . Multiple Vitamins-Minerals (MULTIVITAMIN WITH MINERALS) tablet Take 1 tablet by mouth daily. (Patient not taking: Reported on 06/29/2020)    . psyllium (METAMUCIL) 58.6 % packet Take 1 packet by mouth as needed (constipation). (Patient not taking: Reported on 06/29/2020)     No facility-administered medications prior to visit.    ROS Review of Systems  Constitutional: Negative for appetite change, diaphoresis, fatigue and unexpected weight change.  HENT: Negative.   Eyes: Negative for visual disturbance.  Respiratory: Negative for cough, chest tightness,  shortness of breath and wheezing.   Cardiovascular: Negative for chest pain, palpitations and leg swelling.  Gastrointestinal: Negative for abdominal pain, constipation, diarrhea, nausea and vomiting.  Endocrine: Negative.   Genitourinary: Negative.  Negative for difficulty urinating.  Musculoskeletal: Positive for arthralgias. Negative for myalgias and neck pain.  Skin: Negative.  Negative for color change and pallor.  Neurological: Positive for dizziness and light-headedness. Negative for weakness.  Hematological: Negative for adenopathy. Does not bruise/bleed easily.  Psychiatric/Behavioral: Negative.     Objective:  BP  (!) 148/86 (BP Location: Right Arm, Patient Position: Sitting, Cuff Size: Normal)   Pulse 83   Temp 99 F (37.2 C) (Oral)   Ht 5' 2.5" (1.588 m)   Wt 143 lb 9.6 oz (65.1 kg)   SpO2 98%   BMI 25.85 kg/m   Physical Exam Vitals reviewed.  HENT:     Nose: Nose normal.  Eyes:     General: No scleral icterus.    Conjunctiva/sclera: Conjunctivae normal.  Cardiovascular:     Rate and Rhythm: Normal rate and regular rhythm.     Heart sounds: No murmur heard.     Comments: EKG- NSR, 73 bpm ??LAE No LVH or Q waves Pulmonary:     Effort: Pulmonary effort is normal.     Breath sounds: No stridor. No wheezing, rhonchi or rales.  Abdominal:     General: Abdomen is flat. There is no distension.     Palpations: Abdomen is soft. There is no hepatomegaly, splenomegaly or mass.     Tenderness: There is no abdominal tenderness. There is no guarding.  Musculoskeletal:        General: No swelling, tenderness or deformity. Normal range of motion.     Cervical back: Neck supple.     Right lower leg: No edema.     Left lower leg: No edema.  Lymphadenopathy:     Cervical: No cervical adenopathy.  Skin:    General: Skin is warm and dry.  Neurological:     General: No focal deficit present.     Mental Status: She is alert.  Psychiatric:        Mood and Affect: Mood normal.        Behavior: Behavior normal.     Lab Results  Component Value Date   WBC 4.3 06/29/2020   HGB 11.6 (L) 06/29/2020   HCT 34.7 (L) 06/29/2020   PLT 171.0 06/29/2020   GLUCOSE 101 (H) 06/29/2020   CHOL 179 06/29/2020   TRIG 96.0 06/29/2020   HDL 53.60 06/29/2020   LDLCALC 106 (H) 06/29/2020   ALT 18 06/29/2020   AST 27 06/29/2020   NA 143 06/29/2020   K 3.3 (L) 06/29/2020   CL 104 06/29/2020   CREATININE 0.74 06/29/2020   BUN 17 06/29/2020   CO2 33 (H) 06/29/2020   TSH 2.00 08/13/2019   INR 1.6 (H) 11/01/2006   HGBA1C 5.9 08/13/2019    Assessment & Plan:   Gladiola was seen today for  hypertension.  Diagnoses and all orders for this visit:  Essential hypertension, benign- Her blood pressure is not adequately well controlled and she has a low potassium level.  I recommended that she restart carvedilol and to start taking a potassium sparing diuretic. -     Basic metabolic panel; Future -     EKG 12-Lead -     Basic metabolic panel -     carvedilol (COREG) 3.125 MG tablet; Take 1 tablet (3.125 mg total) by mouth  2 (two) times daily with a meal. -     spironolactone (ALDACTONE) 25 MG tablet; Take 1 tablet (25 mg total) by mouth daily. -     potassium chloride SA (KLOR-CON) 20 MEQ tablet; Take 1 tablet (20 mEq total) by mouth daily.  Hypokalemia- See above. -     Basic metabolic panel; Future -     Magnesium; Future -     Magnesium -     Basic metabolic panel -     spironolactone (ALDACTONE) 25 MG tablet; Take 1 tablet (25 mg total) by mouth daily. -     potassium chloride SA (KLOR-CON) 20 MEQ tablet; Take 1 tablet (20 mEq total) by mouth daily.  Hyperlipidemia with target LDL less than 130- I have asked her to restart the statin for cardiovascular risk reduction. -     Lipid panel; Future -     Hepatic function panel; Future -     CK; Future -     CK -     Hepatic function panel -     Lipid panel -     pravastatin (PRAVACHOL) 40 MG tablet; Take 1 tablet (40 mg total) by mouth daily.  Thrombocytopenia ([place])- Her platelet count is normal now. -     CBC with Differential/Platelet; Future -     Vitamin B12; Future -     Folate; Future -     Folate -     Vitamin B12 -     CBC with Differential/Platelet  Primary osteoarthritis involving multiple joints- Her exam, symptoms, and labs are consistent with osteoarthritis.  She is getting adequate symptom relief with Tylenol. -     C-reactive protein; Future -     C-reactive protein  LAE (left atrial enlargement) -     Cancel: Ambulatory referral to Cardiology -     Ambulatory referral to Cardiology  Deficiency  anemia- I have asked her to return to be screened for vitamin deficiencies. -     Iron; Future -     Reticulocytes; Future -     Ferritin; Future -     Vitamin B1; Future  Hyperlipidemia, unspecified hyperlipidemia type   I have discontinued Ramina W. Hammer's aspirin, calcium carbonate, vitamin E, multivitamin with minerals, Cinnamon, Cod Liver Oil, ibuprofen, carboxymethylcellulose, psyllium, carvedilol, and losartan-hydrochlorothiazide. I have also changed her pravastatin and potassium chloride SA. Additionally, I am having her start on carvedilol and spironolactone. Lastly, I am having her maintain her Fish Oil, Cyanocobalamin (VITAMIN B 12 PO), and acetaminophen.  Meds ordered this encounter  Medications  . pravastatin (PRAVACHOL) 40 MG tablet    Sig: Take 1 tablet (40 mg total) by mouth daily.    Dispense:  90 tablet    Refill:  1  . carvedilol (COREG) 3.125 MG tablet    Sig: Take 1 tablet (3.125 mg total) by mouth 2 (two) times daily with a meal.    Dispense:  180 tablet    Refill:  0  . spironolactone (ALDACTONE) 25 MG tablet    Sig: Take 1 tablet (25 mg total) by mouth daily.    Dispense:  90 tablet    Refill:  3  . potassium chloride SA (KLOR-CON) 20 MEQ tablet    Sig: Take 1 tablet (20 mEq total) by mouth daily.    Dispense:  90 tablet    Refill:  0     Follow-up: Return in about 3 months (around 09/26/2020).  Scarlette Calico, MD

## 2020-07-12 ENCOUNTER — Ambulatory Visit: Payer: Medicare PPO | Admitting: Cardiology

## 2020-07-19 ENCOUNTER — Encounter: Payer: Self-pay | Admitting: Cardiology

## 2020-07-19 ENCOUNTER — Other Ambulatory Visit: Payer: Self-pay

## 2020-07-19 ENCOUNTER — Ambulatory Visit (INDEPENDENT_AMBULATORY_CARE_PROVIDER_SITE_OTHER): Payer: Medicare PPO | Admitting: Cardiology

## 2020-07-19 VITALS — BP 136/90 | HR 70 | Ht 62.5 in | Wt 144.0 lb

## 2020-07-19 DIAGNOSIS — I10 Essential (primary) hypertension: Secondary | ICD-10-CM | POA: Diagnosis not present

## 2020-07-19 DIAGNOSIS — E785 Hyperlipidemia, unspecified: Secondary | ICD-10-CM | POA: Diagnosis not present

## 2020-07-19 DIAGNOSIS — R9431 Abnormal electrocardiogram [ECG] [EKG]: Secondary | ICD-10-CM

## 2020-07-19 DIAGNOSIS — I517 Cardiomegaly: Secondary | ICD-10-CM | POA: Diagnosis not present

## 2020-07-19 NOTE — Progress Notes (Signed)
Cardiology Office Note:    Date:  07/19/2020   ID:  [name], DOB 04-24-1946, MRN [id number]  PCP:  [name], MD   [place]  Cardiologist:  Candee Furbish, MD  Advanced Practice Provider:  No care team member to display Electrophysiologist:  None       Referring MD: [name], MD    History of Present Illness:    [name] is a 75 y.o. female with hypertension hyperlipidemia here for the evaluation of abnormal EKG, possible left atrial enlargement noted.  As far symptoms, she is not having any palpitations, no shortness of breath.  She exercises regularly walking about 1/2 miles per day.  She will occasionally have a sensation of centralized chest discomfort that seems to be relatively mild but she states that it is concerning at times.  This is nonexertional.  Does not seem to be associated with food or exercise.  Just can come on at any time.  No orthopnea no PND no syncope no bleeding no fevers.  Past Medical History:  Diagnosis Date  . Hyperlipidemia   . Hypertension     Past Surgical History:  Procedure Laterality Date  . LAPAROSCOPIC LYSIS OF ADHESIONS  09/08/2017  . LAPAROSCOPY N/A 09/08/2017   Procedure: LYSIS OF ADHESIONS LAPAROSCOPIC;  Surgeon: Kieth Brightly Arta Bruce, MD;  Location: [place];  Service: General;  Laterality: N/A;  . REPLACEMENT TOTAL KNEE Left     Current Medications: Current Meds  Medication Sig  . acetaminophen (TYLENOL) 325 MG tablet Take 2 tablets (650 mg total) by mouth every 6 (six) hours as needed for mild pain (or temp > 100).  . carvedilol (COREG) 3.125 MG tablet Take 1 tablet (3.125 mg total) by mouth 2 (two) times daily with a meal.  . Cyanocobalamin (VITAMIN B 12 PO) Take 1 tablet by mouth daily.   . Omega-3 Fatty Acids (FISH OIL) 1000 MG CAPS Take by mouth. Reported on 12/05/2015  . potassium chloride SA (KLOR-CON) 20 MEQ tablet Take 1 tablet (20 mEq total) by mouth daily.  .  pravastatin (PRAVACHOL) 40 MG tablet Take 1 tablet (40 mg total) by mouth daily.  Marland Kitchen spironolactone (ALDACTONE) 25 MG tablet Take 1 tablet (25 mg total) by mouth daily.     Allergies:   Patient has no known allergies.   Social History   Socioeconomic History  . Marital status: Single    Spouse name: Not on file  . Number of children: Not on file  . Years of education: Not on file  . Highest education level: Not on file  Occupational History  . Not on file  Tobacco Use  . Smoking status: Never Smoker  . Smokeless tobacco: Never Used  Vaping Use  . Vaping Use: Never used  Substance and Sexual Activity  . Alcohol use: No    Alcohol/week: 0.0 standard drinks    Comment: occasional  . Drug use: No  . Sexual activity: Not Currently    Birth control/protection: None  Other Topics Concern  . Not on file  Social History Narrative  . Not on file   Social Determinants of Health   Financial Resource Strain: Not on file  Food Insecurity: Not on file  Transportation Needs: Not on file  Physical Activity: Not on file  Stress: Not on file  Social Connections: Not on file     Family History: The patient's family history includes Cancer in her mother; Diabetes in her brother  and sister; Heart disease in her father.  ROS:   Please see the history of present illness.     All other systems reviewed and are negative.  EKGs/Labs/Other Studies Reviewed:     Recent Labs: 08/13/2019: TSH 2.00 06/29/2020: ALT 18; BUN 17; Creatinine, Ser 0.74; Hemoglobin 11.6; Magnesium 2.0; Platelets 171.0; Potassium 3.3; Sodium 143  Recent Lipid Panel    Component Value Date/Time   CHOL 179 06/29/2020 1034   TRIG 96.0 06/29/2020 1034   HDL 53.60 06/29/2020 1034   CHOLHDL 3 06/29/2020 1034   VLDL 19.2 06/29/2020 1034   LDLCALC 106 (H) 06/29/2020 1034     Risk Assessment/Calculations:      Physical Exam:    VS:  BP 136/90 (BP Location: Left Arm, Patient Position: Sitting, Cuff Size: Normal)    Pulse 70   Ht 5' 2.5" (1.588 m)   Wt 144 lb (65.3 kg)   SpO2 97%   BMI 25.92 kg/m     Wt Readings from Last 3 Encounters:  07/19/20 144 lb (65.3 kg)  06/29/20 143 lb 9.6 oz (65.1 kg)  08/13/19 165 lb 3.2 oz (74.9 kg)     GEN:  Well nourished, well developed in no acute distress HEENT: Normal NECK: No JVD; No carotid bruits LYMPHATICS: No lymphadenopathy CARDIAC: RRR, no murmurs, rubs, gallops RESPIRATORY:  Clear to auscultation without rales, wheezing or rhonchi  ABDOMEN: Soft, non-tender, non-distended MUSCULOSKELETAL:  No edema; No deformity  SKIN: Warm and dry NEUROLOGIC:  Alert and oriented x 3 PSYCHIATRIC:  Normal affect   ASSESSMENT:    1. Nonspecific abnormal electrocardiogram (ECG) (EKG)   2. Essential hypertension, benign   3. LAE (left atrial enlargement)   4. Hyperlipidemia with target LDL less than 130    PLAN:    In order of problems listed above:  Abnormal EKG with left atrial enlargement -We will check an echocardiogram to measure left atrial dimensions and to ensure proper structure and function of her heart.  She is not having any arrhythmias.  EKG was personally reviewed and possible left atrial enlargement noted.  Sinus rhythm.  No evidence of atrial fibrillation.  Chest discomfort -Does not seem to occur with any exertional activity.  She is quite active exercising walking almost a mile and a half every day.  Her father did have heart issues and died in his early 73s, 98. We are going to start here with an echocardiogram.  I did have the benefit of a CT scan that included her heart in 2019 and personally reviewed this images.  She did not have any evidence of calcification of the coronary arteries that were imaged and she did not have any atherosclerosis of her aorta.  She is currently on statin therapy.  Hypertension/hyperlipidemia -On Pravachol, Coreg, Aldactone.  Currently reasonably controlled.  Dr. Ronnald Ramp is watching.    Medication  Adjustments/Labs and Tests Ordered: Current medicines are reviewed at length with the patient today.  Concerns regarding medicines are outlined above.  Orders Placed This Encounter  Procedures  . ECHOCARDIOGRAM COMPLETE   No orders of the defined types were placed in this encounter.   Patient Instructions  Medication Instructions:  The current medical regimen is effective;  continue present plan and medications.  *If you need a refill on your cardiac medications before your next appointment, please call your pharmacy*  Testing/Procedures: Your physician has requested that you have an echocardiogram. Echocardiography is a painless test that uses sound waves to create images of your heart.  It provides your doctor with information about the size and shape of your heart and how well your heart's chambers and valves are working. This procedure takes approximately one hour. There are no restrictions for this procedure.  Follow-Up: At [facility], you and your health needs are our priority.  As part of our continuing mission to provide you with exceptional heart care, we have created designated Provider Care Teams.  These Care Teams include your primary Cardiologist (physician) and Advanced Practice Providers (APPs -  Physician Assistants and Nurse Practitioners) who all work together to provide you with the care you need, when you need it.  We recommend signing up for the patient portal called "MyChart".  Sign up information is provided on this After Visit Summary.  MyChart is used to connect with patients for Virtual Visits (Telemedicine).  Patients are able to view lab/test results, encounter notes, upcoming appointments, etc.  Non-urgent messages can be sent to your provider as well.   To learn more about what you can do with MyChart, go to [email or web address].    Your next appointment:   Follow up will be based on the results of the above testing.  Thank you for choosing [facility]!!        Signed, Candee Furbish, MD  07/19/2020 10:25 AM    [place]

## 2020-07-19 NOTE — Patient Instructions (Signed)
Medication Instructions:  The current medical regimen is effective;  continue present plan and medications.  *If you need a refill on your cardiac medications before your next appointment, please call your pharmacy*  Testing/Procedures: Your physician has requested that you have an echocardiogram. Echocardiography is a painless test that uses sound waves to create images of your heart. It provides your doctor with information about the size and shape of your heart and how well your heart's chambers and valves are working. This procedure takes approximately one hour. There are no restrictions for this procedure.  Follow-Up: At [facility], you and your health needs are our priority.  As part of our continuing mission to provide you with exceptional heart care, we have created designated Provider Care Teams.  These Care Teams include your primary Cardiologist (physician) and Advanced Practice Providers (APPs -  Physician Assistants and Nurse Practitioners) who all work together to provide you with the care you need, when you need it.  We recommend signing up for the patient portal called "MyChart".  Sign up information is provided on this After Visit Summary.  MyChart is used to connect with patients for Virtual Visits (Telemedicine).  Patients are able to view lab/test results, encounter notes, upcoming appointments, etc.  Non-urgent messages can be sent to your provider as well.   To learn more about what you can do with MyChart, go to [email or web address].    Your next appointment:   Follow up will be based on the results of the above testing.  Thank you for choosing [facility]!!

## 2020-07-20 ENCOUNTER — Telehealth: Payer: Self-pay | Admitting: Pharmacist

## 2020-07-20 NOTE — Telephone Encounter (Signed)
Attempted to contact patient for pharmacy consult at request of PCP. No answer, left voicemail to call back.   Patient's significant medical conditions include: Hypertension, Hyperlipidemia, Osteoarthritis and Anemia, Hypokalemia  Recent office visits: 06/29/20 Dr Ronnald Ramp OV: establish care, pt is poor historian, thinks she is taking 2 medications but does not know names. Started carvedilol 3.125 mg BID and spironolactone 25 mg w/ Klor Con 20 meq. Started pravastatin 40 mg. Taking tylenol for arthritis. -stopped aspirin, supplements, ibuprofen, losartan-hctz,  07/19/20 Dr Marlou Porch (cardiology): eval for abnormal EKG w/ L atrial enlargement. Ordered ECHO. No evidence of calcification on previous CT scan from 2019.  Objective:  Outpatient Encounter Medications as of 07/20/2020  Medication Sig  . acetaminophen (TYLENOL) 325 MG tablet Take 2 tablets (650 mg total) by mouth every 6 (six) hours as needed for mild pain (or temp > 100).  . carvedilol (COREG) 3.125 MG tablet Take 1 tablet (3.125 mg total) by mouth 2 (two) times daily with a meal.  . Cyanocobalamin (VITAMIN B 12 PO) Take 1 tablet by mouth daily.   . Omega-3 Fatty Acids (FISH OIL) 1000 MG CAPS Take by mouth. Reported on 12/05/2015  . potassium chloride SA (KLOR-CON) 20 MEQ tablet Take 1 tablet (20 mEq total) by mouth daily.  . pravastatin (PRAVACHOL) 40 MG tablet Take 1 tablet (40 mg total) by mouth daily.  Marland Kitchen spironolactone (ALDACTONE) 25 MG tablet Take 1 tablet (25 mg total) by mouth daily.   No facility-administered encounter medications on file as of 07/20/2020.   No Known Allergies  Lab Results  Component Value Date   CREATININE 0.74 06/29/2020   BUN 17 06/29/2020   GFR 79.79 06/29/2020   GFRNONAA 48 (L) 12/21/2017   GFRAA 56 (L) 12/21/2017   NA 143 06/29/2020   K 3.3 (L) 06/29/2020   CALCIUM 9.5 06/29/2020   CO2 33 (H) 06/29/2020   Lab Results  Component Value Date/Time   HGBA1C 5.9 08/13/2019 09:09 AM   Last lipids Lab  Results  Component Value Date   CHOL 179 06/29/2020   HDL 53.60 06/29/2020   LDLCALC 106 (H) 06/29/2020   TRIG 96.0 06/29/2020   CHOLHDL 3 06/29/2020   The 10-year ASCVD risk score Mikey Bussing DC Jr., et al., 2013) is: 14.4%   Values used to calculate the score:     Age: 34 years     Sex: Female     Is Non-Hispanic African American: Yes     Diabetic: No     Tobacco smoker: No     Systolic Blood Pressure: 967 mmHg     Is BP treated: Yes     HDL Cholesterol: 53.6 mg/dL     Total Cholesterol: 179 mg/dL  CBC Latest Ref Rng & Units 06/29/2020 08/13/2019 07/31/2018  WBC 4.0 - 10.5 K/uL 4.3 4.5 5.5  Hemoglobin 12.0 - 15.0 g/dL 11.6(L) 12.8 12.6  Hematocrit 36.0 - 46.0 % 34.7(L) 38.1 37.6  Platelets 150.0 - 400.0 K/uL 171.0 141.0(L) 158.0   Hepatic Function Latest Ref Rng & Units 06/29/2020 08/13/2019 07/31/2018  Total Protein 6.0 - 8.3 g/dL 7.1 7.0 6.8  Albumin 3.5 - 5.2 g/dL 3.8 4.1 4.0  AST 0 - 37 U/L _0 ALT 0 - 35 U/L _1 Alk Phosphatase 39 - 117 U/L 58 58 59  Total Bilirubin 0.2 - 1.2 mg/dL 0.9 1.3(H) 0.9  Bilirubin, Direct 0.0 - 0.3 mg/dL 0.1 - -   BP Readings from Last 3 Encounters:  07/19/20  136/90  06/29/20 (!) 148/86  08/13/19 130/84   Pulse Readings from Last 3 Encounters:  07/19/20 70  06/29/20 83  08/13/19 74   Wt Readings from Last 3 Encounters:  07/19/20 144 lb (65.3 kg)  06/29/20 143 lb 9.6 oz (65.1 kg)  08/13/19 165 lb 3.2 oz (74.9 kg)   Immunization History  Administered Date(s) Administered  . Fluad Quad(high Dose 65+) 02/19/2020  . Influenza, High Dose Seasonal PF 07/31/2018  . PFIZER(Purple Top)SARS-COV-2 Vaccination 07/16/2019, 08/11/2019, 02/17/2020  . Td 05/21/2009  . Tdap 12/19/2011    Assessment/Plan:

## 2020-08-12 ENCOUNTER — Other Ambulatory Visit: Payer: Self-pay

## 2020-08-12 ENCOUNTER — Ambulatory Visit (HOSPITAL_COMMUNITY): Payer: Medicare PPO | Attending: Cardiology

## 2020-08-12 DIAGNOSIS — R9431 Abnormal electrocardiogram [ECG] [EKG]: Secondary | ICD-10-CM | POA: Diagnosis not present

## 2020-08-12 DIAGNOSIS — I10 Essential (primary) hypertension: Secondary | ICD-10-CM | POA: Diagnosis not present

## 2020-08-12 LAB — ECHOCARDIOGRAM COMPLETE
Area-P 1/2: 3.77 cm2
S' Lateral: 1.7 cm

## 2020-08-15 ENCOUNTER — Telehealth: Payer: Self-pay | Admitting: Cardiology

## 2020-08-15 NOTE — Telephone Encounter (Signed)
Patient is calling with concerns from the procedure that she had on thursdays. Please advise

## 2020-08-15 NOTE — Telephone Encounter (Signed)
[name], MD  08/15/2020 1:25 PM EDT      Normal pump function. Mild left ventricular hypertrophy. Overall reassuring valves. Continue to treat hypertension. Overall reassuring echocardiogram. Candee Furbish, MD   Reviewed results and Dr Marlou Porch comments/recommendations.  Pt states understanding and was appreciative of the call.

## 2020-09-19 ENCOUNTER — Other Ambulatory Visit: Payer: Self-pay

## 2020-09-19 ENCOUNTER — Ambulatory Visit (INDEPENDENT_AMBULATORY_CARE_PROVIDER_SITE_OTHER): Payer: Medicare HMO | Admitting: Internal Medicine

## 2020-09-19 ENCOUNTER — Encounter: Payer: Self-pay | Admitting: Internal Medicine

## 2020-09-19 VITALS — BP 122/68 | HR 80 | Temp 98.6°F | Ht 64.0 in | Wt 138.4 lb

## 2020-09-19 DIAGNOSIS — G252 Other specified forms of tremor: Secondary | ICD-10-CM | POA: Diagnosis not present

## 2020-09-19 DIAGNOSIS — K635 Polyp of colon: Secondary | ICD-10-CM | POA: Insufficient documentation

## 2020-09-19 DIAGNOSIS — Z23 Encounter for immunization: Secondary | ICD-10-CM

## 2020-09-19 DIAGNOSIS — D539 Nutritional anemia, unspecified: Secondary | ICD-10-CM | POA: Diagnosis not present

## 2020-09-19 DIAGNOSIS — I10 Essential (primary) hypertension: Secondary | ICD-10-CM | POA: Diagnosis not present

## 2020-09-19 DIAGNOSIS — Z0001 Encounter for general adult medical examination with abnormal findings: Secondary | ICD-10-CM | POA: Insufficient documentation

## 2020-09-19 DIAGNOSIS — E876 Hypokalemia: Secondary | ICD-10-CM | POA: Diagnosis not present

## 2020-09-19 HISTORY — DX: Polyp of colon: K63.5

## 2020-09-19 LAB — CBC WITH DIFFERENTIAL/PLATELET
Basophils Absolute: 0 10*3/uL (ref 0.0–0.1)
Basophils Relative: 0.5 % (ref 0.0–3.0)
Eosinophils Absolute: 0.1 10*3/uL (ref 0.0–0.7)
Eosinophils Relative: 2.5 % (ref 0.0–5.0)
HCT: 36.5 % (ref 36.0–46.0)
Hemoglobin: 12.3 g/dL (ref 12.0–15.0)
Lymphocytes Relative: 33.2 % (ref 12.0–46.0)
Lymphs Abs: 1.3 10*3/uL (ref 0.7–4.0)
MCHC: 33.6 g/dL (ref 30.0–36.0)
MCV: 92 fl (ref 78.0–100.0)
Monocytes Absolute: 0.3 10*3/uL (ref 0.1–1.0)
Monocytes Relative: 8.7 % (ref 3.0–12.0)
Neutro Abs: 2.2 10*3/uL (ref 1.4–7.7)
Neutrophils Relative %: 55.1 % (ref 43.0–77.0)
Platelets: 183 10*3/uL (ref 150.0–400.0)
RBC: 3.96 Mil/uL (ref 3.87–5.11)
RDW: 14.9 % (ref 11.5–15.5)
WBC: 4 10*3/uL (ref 4.0–10.5)

## 2020-09-19 LAB — BASIC METABOLIC PANEL
BUN: 26 mg/dL — ABNORMAL HIGH (ref 6–23)
CO2: 31 mEq/L (ref 19–32)
Calcium: 9.8 mg/dL (ref 8.4–10.5)
Chloride: 104 mEq/L (ref 96–112)
Creatinine, Ser: 0.93 mg/dL (ref 0.40–1.20)
GFR: 60.56 mL/min (ref >60.00)
Glucose, Bld: 96 mg/dL (ref 70–99)
Potassium: 3.9 mEq/L (ref 3.5–5.1)
Sodium: 142 mEq/L (ref 135–145)

## 2020-09-19 LAB — FERRITIN: Ferritin: 31.1 ng/mL (ref 10.0–291.0)

## 2020-09-19 LAB — MAGNESIUM: Magnesium: 2.2 mg/dL (ref 1.5–2.5)

## 2020-09-19 LAB — TSH: TSH: 1.25 u[IU]/mL (ref 0.35–4.50)

## 2020-09-19 LAB — IRON: Iron: 86 ug/dL (ref 42–145)

## 2020-09-19 MED ORDER — CARVEDILOL 3.125 MG PO TABS: 3.1250 mg | ORAL_TABLET | Freq: Two times a day (BID) | ORAL | 0 refills | Status: DC

## 2020-09-19 NOTE — Patient Instructions (Signed)
Health Maintenance, Female Adopting a healthy lifestyle and getting preventive care are important in promoting health and wellness. Ask your health care provider about:  The right schedule for you to have regular tests and exams.  Things you can do on your own to prevent diseases and keep yourself healthy. What should I know about diet, weight, and exercise? Eat a healthy diet  Eat a diet that includes plenty of vegetables, fruits, low-fat dairy products, and lean protein.  Do not eat a lot of foods that are high in solid fats, added sugars, or sodium.   Maintain a healthy weight Body mass index (BMI) is used to identify weight problems. It estimates body fat based on height and weight. Your health care provider can help determine your BMI and help you achieve or maintain a healthy weight. Get regular exercise Get regular exercise. This is one of the most important things you can do for your health. Most adults should:  Exercise for at least 150 minutes each week. The exercise should increase your heart rate and make you sweat (moderate-intensity exercise).  Do strengthening exercises at least twice a week. This is in addition to the moderate-intensity exercise.  Spend less time sitting. Even light physical activity can be beneficial. Watch cholesterol and blood lipids Have your blood tested for lipids and cholesterol at 75 years of age, then have this test every 5 years. Have your cholesterol levels checked more often if:  Your lipid or cholesterol levels are high.  You are older than 75 years of age.  You are at high risk for heart disease. What should I know about cancer screening? Depending on your health history and family history, you may need to have cancer screening at various ages. This may include screening for:  Breast cancer.  Cervical cancer.  Colorectal cancer.  Skin cancer.  Lung cancer. What should I know about heart disease, diabetes, and high blood  pressure? Blood pressure and heart disease  High blood pressure causes heart disease and increases the risk of stroke. This is more likely to develop in people who have high blood pressure readings, are of African descent, or are overweight.  Have your blood pressure checked: ? Every 3-5 years if you are 18-39 years of age. ? Every year if you are 40 years old or older. Diabetes Have regular diabetes screenings. This checks your fasting blood sugar level. Have the screening done:  Once every three years after age 40 if you are at a normal weight and have a low risk for diabetes.  More often and at a younger age if you are overweight or have a high risk for diabetes. What should I know about preventing infection? Hepatitis B If you have a higher risk for hepatitis B, you should be screened for this virus. Talk with your health care provider to find out if you are at risk for hepatitis B infection. Hepatitis C Testing is recommended for:  Everyone born from 1945 through 1965.  Anyone with known risk factors for hepatitis C. Sexually transmitted infections (STIs)  Get screened for STIs, including gonorrhea and chlamydia, if: ? You are sexually active and are younger than 75 years of age. ? You are older than 75 years of age and your health care provider tells you that you are at risk for this type of infection. ? Your sexual activity has changed since you were last screened, and you are at increased risk for chlamydia or gonorrhea. Ask your health care provider   if you are at risk.  Ask your health care provider about whether you are at high risk for HIV. Your health care provider may recommend a prescription medicine to help prevent HIV infection. If you choose to take medicine to prevent HIV, you should first get tested for HIV. You should then be tested every 3 months for as long as you are taking the medicine. Pregnancy  If you are about to stop having your period (premenopausal) and  you may become pregnant, seek counseling before you get pregnant.  Take 400 to 800 micrograms (mcg) of folic acid every day if you become pregnant.  Ask for birth control (contraception) if you want to prevent pregnancy. Osteoporosis and menopause Osteoporosis is a disease in which the bones lose minerals and strength with aging. This can result in bone fractures. If you are 65 years old or older, or if you are at risk for osteoporosis and fractures, ask your health care provider if you should:  Be screened for bone loss.  Take a calcium or vitamin D supplement to lower your risk of fractures.  Be given hormone replacement therapy (HRT) to treat symptoms of menopause. Follow these instructions at home: Lifestyle  Do not use any products that contain nicotine or tobacco, such as cigarettes, e-cigarettes, and chewing tobacco. If you need help quitting, ask your health care provider.  Do not use street drugs.  Do not share needles.  Ask your health care provider for help if you need support or information about quitting drugs. Alcohol use  Do not drink alcohol if: ? Your health care provider tells you not to drink. ? You are pregnant, may be pregnant, or are planning to become pregnant.  If you drink alcohol: ? Limit how much you use to 0-1 drink a day. ? Limit intake if you are breastfeeding.  Be aware of how much alcohol is in your drink. In the [place] one drink equals one 12 oz bottle of beer (355 mL), one 5 oz glass of wine (148 mL), or one 1 oz glass of hard liquor (44 mL). General instructions  Schedule regular health, dental, and eye exams.  Stay current with your vaccines.  Tell your health care provider if: ? You often feel depressed. ? You have ever been abused or do not feel safe at home. Summary  Adopting a healthy lifestyle and getting preventive care are important in promoting health and wellness.  Follow your health care provider's instructions about healthy  diet, exercising, and getting tested or screened for diseases.  Follow your health care provider's instructions on monitoring your cholesterol and blood pressure. This information is not intended to replace advice given to you by your health care provider. Make sure you discuss any questions you have with your health care provider. Document Revised: 04/30/2018 Document Reviewed: 04/30/2018 Elsevier Patient Education  2021 Elsevier Inc.  

## 2020-09-19 NOTE — Progress Notes (Signed)
Subjective:  Patient ID: [name], female    DOB: 1946-01-13  Age: 75 y.o. MRN: [id number]  CC: Anemia, Annual Exam, and Hypertension  This visit occurred during the SARS-CoV-2 public health emergency.  Safety protocols were in place, including screening questions prior to the visit, additional usage of staff PPE, and extensive cleaning of exam room while observing appropriate contact time as indicated for disinfecting solutions.    HPI [name] presents for f/up and CPX.  She continues to complain of weight loss and musculoskeletal pain.  She also tells me that she has developed a tremor in her right hand.  She was recently seen for thrombocytopenia and was found to have a normal B12 and folate level.  She tells me her feet feel cold at times but she denies claudication.  She is active and denies any recent episodes of chest pain, shortness of breath, palpitations, edema, dizziness, or lightheadedness.  Outpatient Medications Prior to Visit  Medication Sig Dispense Refill  . acetaminophen (TYLENOL) 325 MG tablet Take 2 tablets (650 mg total) by mouth every 6 (six) hours as needed for mild pain (or temp > 100).    . Cyanocobalamin (VITAMIN B 12 PO) Take 1 tablet by mouth daily.     . Omega-3 Fatty Acids (FISH OIL) 1000 MG CAPS Take by mouth. Reported on 12/05/2015    . potassium chloride SA (KLOR-CON) 20 MEQ tablet Take 1 tablet (20 mEq total) by mouth daily. 90 tablet 0  . pravastatin (PRAVACHOL) 40 MG tablet Take 1 tablet (40 mg total) by mouth daily. 90 tablet 1  . spironolactone (ALDACTONE) 25 MG tablet Take 1 tablet (25 mg total) by mouth daily. 90 tablet 3  . carvedilol (COREG) 3.125 MG tablet Take 1 tablet (3.125 mg total) by mouth 2 (two) times daily with a meal. 180 tablet 0  . losartan-hydrochlorothiazide (HYZAAR) 100-12.5 MG tablet      No facility-administered medications prior to visit.    ROS Review of Systems  Constitutional: Positive for  unexpected weight change (wt loss). Negative for appetite change, chills, diaphoresis and fatigue.  HENT: Negative.  Negative for sore throat and trouble swallowing.   Eyes: Negative.   Respiratory: Negative for cough, chest tightness, shortness of breath and wheezing.   Cardiovascular: Negative for chest pain, palpitations and leg swelling.  Gastrointestinal: Negative for abdominal pain, blood in stool, constipation, diarrhea, nausea and vomiting.  Endocrine: Negative.   Genitourinary: Negative.  Negative for difficulty urinating and hematuria.  Musculoskeletal: Positive for arthralgias. Negative for back pain and myalgias.  Skin: Negative.  Negative for color change and pallor.  Neurological: Positive for tremors (right hand, when she writes). Negative for dizziness, weakness, light-headedness, numbness and headaches.  Hematological: Negative for adenopathy. Does not bruise/bleed easily.  Psychiatric/Behavioral: Positive for decreased concentration. Negative for self-injury and sleep disturbance. The patient is not nervous/anxious.     Objective:  BP 122/68 (BP Location: Left Arm, Patient Position: Sitting, Cuff Size: Large)   Pulse 80   Temp 98.6 F (37 C) (Oral)   Ht 5\' 4"  (1.626 m)   Wt 138 lb 6.4 oz (62.8 kg)   SpO2 98%   BMI 23.76 kg/m   BP Readings from Last 3 Encounters:  09/19/20 122/68  07/19/20 136/90  06/29/20 (!) 148/86    Wt Readings from Last 3 Encounters:  09/19/20 138 lb 6.4 oz (62.8 kg)  07/19/20 144 lb (65.3 kg)  06/29/20 143 lb 9.6 oz (65.1 kg)  Physical Exam Vitals reviewed.  Constitutional:      Appearance: Normal appearance.  HENT:     Nose: Nose normal.     Mouth/Throat:     Mouth: Mucous membranes are moist.  Eyes:     General: No scleral icterus.    Conjunctiva/sclera: Conjunctivae normal.  Cardiovascular:     Rate and Rhythm: Normal rate and regular rhythm.     Heart sounds: No murmur heard.   Pulmonary:     Effort: Pulmonary effort  is normal.     Breath sounds: No stridor. No wheezing, rhonchi or rales.  Abdominal:     General: Abdomen is flat. Bowel sounds are normal. There is no distension.     Palpations: Abdomen is soft. There is no hepatomegaly, splenomegaly or mass.     Tenderness: There is no abdominal tenderness.  Musculoskeletal:        General: Normal range of motion.     Cervical back: Neck supple.     Right lower leg: No edema.     Left lower leg: No edema.  Lymphadenopathy:     Cervical: No cervical adenopathy.  Skin:    General: Skin is warm and dry.     Coloration: Skin is not pale.  Neurological:     General: No focal deficit present.     Mental Status: She is alert and oriented to person, place, and time. Mental status is at baseline.  Psychiatric:        Mood and Affect: Mood normal.        Behavior: Behavior normal.     Lab Results  Component Value Date   WBC 4.0 09/19/2020   HGB 12.3 09/19/2020   HCT 36.5 09/19/2020   PLT 183.0 09/19/2020   GLUCOSE 96 09/19/2020   CHOL 179 06/29/2020   TRIG 96.0 06/29/2020   HDL 53.60 06/29/2020   LDLCALC 106 (H) 06/29/2020   ALT 18 06/29/2020   AST 27 06/29/2020   NA 142 09/19/2020   K 3.9 09/19/2020   CL 104 09/19/2020   CREATININE 0.93 09/19/2020   BUN 26 (H) 09/19/2020   CO2 31 09/19/2020   TSH 1.25 09/19/2020   INR 1.6 (H) 11/01/2006   HGBA1C 5.9 08/13/2019    MM 3D SCREEN BREAST BILATERAL  Result Date: 03/30/2020 CLINICAL DATA:  Screening. EXAM: DIGITAL SCREENING BILATERAL MAMMOGRAM WITH TOMO AND CAD COMPARISON:  Previous exam(s). ACR Breast Density Category b: There are scattered areas of fibroglandular density. FINDINGS: There are no findings suspicious for malignancy. Images were processed with CAD. IMPRESSION: No mammographic evidence of malignancy. A result letter of this screening mammogram will be mailed directly to the patient. RECOMMENDATION: Screening mammogram in one year. (Code:SM-B-01Y) BI-RADS CATEGORY  1: Negative.  Electronically Signed   By: [name] M.D.   On: 03/30/2020 15:25    Assessment & Plan:   [name] was seen today for anemia, annual exam and hypertension.  Diagnoses and all orders for this visit:  Deficiency anemia- Her H&H have improved.  I will screen her for vitamin deficiencies. -     CBC with Differential/Platelet; Future -     Iron; Future -     Ferritin; Future -     Vitamin B1; Future -     Lactate dehydrogenase; Future -     Reticulocytes; Future -     Hemoglobinopathy Evaluation; Future -     Haptoglobin; Future -     Haptoglobin -     Hemoglobinopathy Evaluation -  Reticulocytes -     Lactate dehydrogenase -     Vitamin B1 -     Ferritin -     Iron -     CBC with Differential/Platelet  Essential hypertension, benign- Her blood pressure is adequately well controlled. -     Basic metabolic panel; Future -     Magnesium; Future -     TSH; Future -     carvedilol (COREG) 3.125 MG tablet; Take 1 tablet (3.125 mg total) by mouth 2 (two) times daily with a meal. -     TSH -     Magnesium -     Basic metabolic panel  Hypokalemia- Her potassium level is normal now and her blood pressure is somewhat overcontrolled.  I recommended that she stop taking the thiazide diuretic and to continue taking the current dose of spironolactone. -     Basic metabolic panel; Future -     Magnesium; Future -     Magnesium -     Basic metabolic panel  Polyp of colon, unspecified part of colon, unspecified type- She tells me that she had a colonoscopy 16 years ago that was positive for polyps. -     Ambulatory referral to Gastroenterology  Tremor, coarse -     Ambulatory referral to Neurology  Encounter for general adult medical examination with abnormal findings- Exam completed, labs reviewed, vaccines reviewed and updated, cancer screenings addressed, patient education was given.  Need for vaccination -     Pneumococcal polysaccharide vaccine 23-valent greater than or equal  to 2yo subcutaneous/IM   I have discontinued Clotilda W. Nylander's losartan-hydrochlorothiazide. I am also having her maintain her Fish Oil, Cyanocobalamin (VITAMIN B 12 PO), acetaminophen, pravastatin, spironolactone, potassium chloride SA, and carvedilol.  Meds ordered this encounter  Medications  . carvedilol (COREG) 3.125 MG tablet    Sig: Take 1 tablet (3.125 mg total) by mouth 2 (two) times daily with a meal.    Dispense:  180 tablet    Refill:  0     Follow-up: Return in about 3 months (around 12/20/2020).  Scarlette Calico, MD

## 2020-09-20 LAB — RETICULOCYTES
ABS Retic: 32240 cells/uL (ref 20000–80000)
Retic Ct Pct: 0.8 %

## 2020-09-22 LAB — HEMOGLOBINOPATHY EVALUATION
Fetal Hemoglobin Testing: <1 % (ref 0.0–1.9)
HCT: 36.8 % (ref 35.0–45.0)
Hemoglobin A2 - HGBRFX: 2.6 % (ref 2.2–3.2)
Hemoglobin: 12 g/dL (ref 11.7–15.5)
Hgb A: 97.4 % (ref >96.0)
MCH: 30.5 pg (ref 27.0–33.0)
MCV: 93.4 fL (ref 80.0–100.0)
RBC: 3.94 10*6/uL (ref 3.80–5.10)
RDW: 13.5 % (ref 11.0–15.0)

## 2020-09-22 LAB — HAPTOGLOBIN: Haptoglobin: 116 mg/dL (ref 43–212)

## 2020-09-22 LAB — VITAMIN B1: Vitamin B1 (Thiamine): 12 nmol/L (ref 8–30)

## 2020-09-22 LAB — LACTATE DEHYDROGENASE: LDH: 202 U/L (ref 120–250)

## 2020-09-26 ENCOUNTER — Ambulatory Visit: Payer: Medicare PPO | Admitting: Internal Medicine

## 2020-10-11 ENCOUNTER — Encounter: Payer: Self-pay | Admitting: Neurology

## 2020-10-12 ENCOUNTER — Telehealth: Payer: Self-pay | Admitting: Internal Medicine

## 2020-10-12 ENCOUNTER — Other Ambulatory Visit: Payer: Self-pay | Admitting: Internal Medicine

## 2020-10-12 DIAGNOSIS — E876 Hypokalemia: Secondary | ICD-10-CM

## 2020-10-12 DIAGNOSIS — I10 Essential (primary) hypertension: Secondary | ICD-10-CM

## 2020-10-12 MED ORDER — SPIRONOLACTONE 25 MG PO TABS: 25.0000 mg | ORAL_TABLET | Freq: Every day | ORAL | 0 refills | Status: DC

## 2020-10-12 NOTE — Telephone Encounter (Signed)
1.Medication Requested: spironolactone (ALDACTONE) 25 MG tablet 2. Pharmacy (Name, Street, [place]): [facility] [address] [place] Phone:  [phone]  Fax:  [phone]      3. On Med List: YES   4. Last Visit with PCP: 09/19/2020  5. Next visit date with PCP: N/A   Agent: Please be advised that RX refills may take up to 3 business days. We ask that you follow-up with your pharmacy.

## 2020-10-19 ENCOUNTER — Ambulatory Visit: Payer: Medicare HMO | Admitting: Neurology

## 2020-12-25 ENCOUNTER — Encounter: Payer: Self-pay | Admitting: Internal Medicine

## 2020-12-26 ENCOUNTER — Ambulatory Visit (INDEPENDENT_AMBULATORY_CARE_PROVIDER_SITE_OTHER): Payer: Medicare HMO

## 2020-12-26 ENCOUNTER — Ambulatory Visit (INDEPENDENT_AMBULATORY_CARE_PROVIDER_SITE_OTHER): Payer: Medicare HMO | Admitting: Internal Medicine

## 2020-12-26 ENCOUNTER — Encounter: Payer: Self-pay | Admitting: Internal Medicine

## 2020-12-26 ENCOUNTER — Other Ambulatory Visit: Payer: Self-pay

## 2020-12-26 VITALS — BP 122/80 | HR 74 | Temp 97.8°F | Ht 64.0 in | Wt 143.0 lb

## 2020-12-26 VITALS — BP 134/86 | HR 74 | Temp 97.8°F | Resp 16 | Ht 64.0 in | Wt 143.0 lb

## 2020-12-26 DIAGNOSIS — M25512 Pain in left shoulder: Secondary | ICD-10-CM

## 2020-12-26 DIAGNOSIS — M19011 Primary osteoarthritis, right shoulder: Secondary | ICD-10-CM | POA: Diagnosis not present

## 2020-12-26 DIAGNOSIS — M545 Low back pain, unspecified: Secondary | ICD-10-CM

## 2020-12-26 DIAGNOSIS — G8929 Other chronic pain: Secondary | ICD-10-CM

## 2020-12-26 DIAGNOSIS — F0670 Mild neurocognitive disorder due to known physiological condition without behavioral disturbance: Secondary | ICD-10-CM | POA: Insufficient documentation

## 2020-12-26 DIAGNOSIS — M159 Polyosteoarthritis, unspecified: Secondary | ICD-10-CM

## 2020-12-26 DIAGNOSIS — M8949 Other hypertrophic osteoarthropathy, multiple sites: Secondary | ICD-10-CM

## 2020-12-26 DIAGNOSIS — N3281 Overactive bladder: Secondary | ICD-10-CM | POA: Diagnosis not present

## 2020-12-26 DIAGNOSIS — Z0000 Encounter for general adult medical examination without abnormal findings: Secondary | ICD-10-CM

## 2020-12-26 DIAGNOSIS — I10 Essential (primary) hypertension: Secondary | ICD-10-CM

## 2020-12-26 DIAGNOSIS — M47816 Spondylosis without myelopathy or radiculopathy, lumbar region: Secondary | ICD-10-CM | POA: Diagnosis not present

## 2020-12-26 DIAGNOSIS — M25511 Pain in right shoulder: Secondary | ICD-10-CM

## 2020-12-26 DIAGNOSIS — G309 Alzheimer's disease, unspecified: Secondary | ICD-10-CM

## 2020-12-26 DIAGNOSIS — E876 Hypokalemia: Secondary | ICD-10-CM | POA: Diagnosis not present

## 2020-12-26 DIAGNOSIS — R413 Other amnesia: Secondary | ICD-10-CM | POA: Diagnosis not present

## 2020-12-26 DIAGNOSIS — G3184 Mild cognitive impairment, so stated: Secondary | ICD-10-CM | POA: Insufficient documentation

## 2020-12-26 DIAGNOSIS — M19012 Primary osteoarthritis, left shoulder: Secondary | ICD-10-CM | POA: Diagnosis not present

## 2020-12-26 HISTORY — DX: Alzheimer's disease, unspecified: G30.9

## 2020-12-26 HISTORY — DX: Low back pain, unspecified: M54.50

## 2020-12-26 HISTORY — DX: Other chronic pain: G89.29

## 2020-12-26 HISTORY — DX: Overactive bladder: N32.81

## 2020-12-26 HISTORY — DX: Mild neurocognitive disorder due to known physiological condition without behavioral disturbance: F06.70

## 2020-12-26 LAB — URINALYSIS, ROUTINE W REFLEX MICROSCOPIC
Bilirubin Urine: NEGATIVE
Hgb urine dipstick: NEGATIVE
Ketones, ur: NEGATIVE
Nitrite: NEGATIVE
RBC / HPF: NONE SEEN (ref 0–?)
Specific Gravity, Urine: 1.02 (ref 1.000–1.030)
Total Protein, Urine: NEGATIVE
Urine Glucose: NEGATIVE
Urobilinogen, UA: 0.2 (ref 0.0–1.0)
pH: 6.5 (ref 5.0–8.0)

## 2020-12-26 MED ORDER — MIRABEGRON ER 25 MG PO TB24
25.0000 mg | ORAL_TABLET | Freq: Every day | ORAL | 0 refills | Status: DC
Start: 2020-12-26 — End: 2021-09-25

## 2020-12-26 MED ORDER — SPIRONOLACTONE 25 MG PO TABS: 25.0000 mg | ORAL_TABLET | Freq: Every day | ORAL | 0 refills | Status: DC

## 2020-12-26 MED ORDER — CARVEDILOL 3.125 MG PO TABS: 3.1250 mg | ORAL_TABLET | Freq: Two times a day (BID) | ORAL | 0 refills | Status: DC

## 2020-12-26 NOTE — Patient Instructions (Addendum)
[name] , Thank you for taking time to come for your Medicare Wellness Visit. I appreciate your ongoing commitment to your health goals. Please review the following plan we discussed and let me know if I can assist you in the future.   Screening recommendations/referrals: Colonoscopy: Not a candidate for colon cancer Mammogram: 03/29/2020 Bone Density: 07/28/2018 Recommended yearly ophthalmology/optometry visit for glaucoma screening and checkup Recommended yearly dental visit for hygiene and checkup  Vaccinations: Influenza vaccine: 02/19/2020 Pneumococcal vaccine: 10/27/2014, 09/19/2020 Tdap vaccine: 12/19/2011; due every 10 years Shingles vaccine: 07/10/2017; need second dose   Covid-19: 07/16/2019, 08/11/2019, 02/17/2020, 09/07/2020  Advanced directives: Please bring a copy of your health care power of attorney and living will to the office at your convenience.  Conditions/risks identified: Yes; Client understands the importance of follow-up with providers by attending scheduled visits and discussed goals to eat healthier, increase physical activity, exercise the brain, socialize more, get enough sleep and make time for laughter.  Next appointment: Please schedule your next Medicare Wellness Visit with your Nurse Health Advisor in 1 year by calling [phone].   Preventive Care 4 Years and Older, Female Preventive care refers to lifestyle choices and visits with your health care provider that can promote health and wellness. What does preventive care include? A yearly physical exam. This is also called an annual well check. Dental exams once or twice a year. Routine eye exams. Ask your health care provider how often you should have your eyes checked. Personal lifestyle choices, including: Daily care of your teeth and gums. Regular physical activity. Eating a healthy diet. Avoiding tobacco and drug use. Limiting alcohol use. Practicing safe sex. Taking low-dose aspirin every  day. Taking vitamin and mineral supplements as recommended by your health care provider. What happens during an annual well check? The services and screenings done by your health care provider during your annual well check will depend on your age, overall health, lifestyle risk factors, and family history of disease. Counseling  Your health care provider may ask you questions about your: Alcohol use. Tobacco use. Drug use. Emotional well-being. Home and relationship well-being. Sexual activity. Eating habits. History of falls. Memory and ability to understand (cognition). Work and work Statistician. Reproductive health. Screening  You may have the following tests or measurements: Height, weight, and BMI. Blood pressure. Lipid and cholesterol levels. These may be checked every 5 years, or more frequently if you are over 31 years old. Skin check. Lung cancer screening. You may have this screening every year starting at age 35 if you have a 30-pack-year history of smoking and currently smoke or have quit within the past 15 years. Fecal occult blood test (FOBT) of the stool. You may have this test every year starting at age 13. Flexible sigmoidoscopy or colonoscopy. You may have a sigmoidoscopy every 5 years or a colonoscopy every 10 years starting at age 24. Hepatitis C blood test. Hepatitis B blood test. Sexually transmitted disease (STD) testing. Diabetes screening. This is done by checking your blood sugar (glucose) after you have not eaten for a while (fasting). You may have this done every 1-3 years. Bone density scan. This is done to screen for osteoporosis. You may have this done starting at age 35. Mammogram. This may be done every 1-2 years. Talk to your health care provider about how often you should have regular mammograms. Talk with your health care provider about your test results, treatment options, and if necessary, the need for more tests. Vaccines  Your  health care  provider may recommend certain vaccines, such as: Influenza vaccine. This is recommended every year. Tetanus, diphtheria, and acellular pertussis (Tdap, Td) vaccine. You may need a Td booster every 10 years. Zoster vaccine. You may need this after age 109. Pneumococcal 13-valent conjugate (PCV13) vaccine. One dose is recommended after age 23. Pneumococcal polysaccharide (PPSV23) vaccine. One dose is recommended after age 49. Talk to your health care provider about which screenings and vaccines you need and how often you need them. This information is not intended to replace advice given to you by your health care provider. Make sure you discuss any questions you have with your health care provider. Document Released: 06/03/2015 Document Revised: 01/25/2016 Document Reviewed: 03/08/2015 Elsevier Interactive Patient Education  2017 [employer] Prevention in the Home Falls can cause injuries. They can happen to people of all ages. There are many things you can do to make your home safe and to help prevent falls. What can I do on the outside of my home? Regularly fix the edges of walkways and driveways and fix any cracks. Remove anything that might make you trip as you walk through a door, such as a raised step or threshold. Trim any bushes or trees on the path to your home. Use bright outdoor lighting. Clear any walking paths of anything that might make someone trip, such as rocks or tools. Regularly check to see if handrails are loose or broken. Make sure that both sides of any steps have handrails. Any raised decks and porches should have guardrails on the edges. Have any leaves, snow, or ice cleared regularly. Use sand or salt on walking paths during winter. Clean up any spills in your garage right away. This includes oil or grease spills. What can I do in the bathroom? Use night lights. Install grab bars by the toilet and in the tub and shower. Do not use towel bars as grab  bars. Use non-skid mats or decals in the tub or shower. If you need to sit down in the shower, use a plastic, non-slip stool. Keep the floor dry. Clean up any water that spills on the floor as soon as it happens. Remove soap buildup in the tub or shower regularly. Attach bath mats securely with double-sided non-slip rug tape. Do not have throw rugs and other things on the floor that can make you trip. What can I do in the bedroom? Use night lights. Make sure that you have a light by your bed that is easy to reach. Do not use any sheets or blankets that are too big for your bed. They should not hang down onto the floor. Have a firm chair that has side arms. You can use this for support while you get dressed. Do not have throw rugs and other things on the floor that can make you trip. What can I do in the kitchen? Clean up any spills right away. Avoid walking on wet floors. Keep items that you use a lot in easy-to-reach places. If you need to reach something above you, use a strong step stool that has a grab bar. Keep electrical cords out of the way. Do not use floor polish or wax that makes floors slippery. If you must use wax, use non-skid floor wax. Do not have throw rugs and other things on the floor that can make you trip. What can I do with my stairs? Do not leave any items on the stairs. Make sure that there are handrails  on both sides of the stairs and use them. Fix handrails that are broken or loose. Make sure that handrails are as long as the stairways. Check any carpeting to make sure that it is firmly attached to the stairs. Fix any carpet that is loose or worn. Avoid having throw rugs at the top or bottom of the stairs. If you do have throw rugs, attach them to the floor with carpet tape. Make sure that you have a light switch at the top of the stairs and the bottom of the stairs. If you do not have them, ask someone to add them for you. What else can I do to help prevent  falls? Wear shoes that: Do not have high heels. Have rubber bottoms. Are comfortable and fit you well. Are closed at the toe. Do not wear sandals. If you use a stepladder: Make sure that it is fully opened. Do not climb a closed stepladder. Make sure that both sides of the stepladder are locked into place. Ask someone to hold it for you, if possible. Clearly mark and make sure that you can see: Any grab bars or handrails. First and last steps. Where the edge of each step is. Use tools that help you move around (mobility aids) if they are needed. These include: Canes. Walkers. Scooters. Crutches. Turn on the lights when you go into a dark area. Replace any light bulbs as soon as they burn out. Set up your furniture so you have a clear path. Avoid moving your furniture around. If any of your floors are uneven, fix them. If there are any pets around you, be aware of where they are. Review your medicines with your doctor. Some medicines can make you feel dizzy. This can increase your chance of falling. Ask your doctor what other things that you can do to help prevent falls. This information is not intended to replace advice given to you by your health care provider. Make sure you discuss any questions you have with your health care provider. Document Released: 03/03/2009 Document Revised: 10/13/2015 Document Reviewed: 06/11/2014 Elsevier Interactive Patient Education  2017 [employer].

## 2020-12-26 NOTE — Progress Notes (Signed)
Subjective:   [name] is a 75 y.o. female who presents for Medicare Annual (Subsequent) preventive examination.  Review of Systems     Cardiac Risk Factors include: advanced age (>63mn, >>2women);dyslipidemia;hypertension;family history of premature cardiovascular disease     Objective:    Today's Vitals   12/26/20 0952  BP: 122/80  Pulse: 74  Temp: 97.8 F (36.6 C)  SpO2: 98%  Weight: 143 lb (64.9 kg)  Height: '5\' 4"'$  (1.626 m)  PainSc: 7    Body mass index is 24.55 kg/m.  Advanced Directives 12/26/2020 09/10/2017  Does Patient Have a Medical Advance Directive? Yes No  Type of Advance Directive Living will;Healthcare Power of Attorney -  Does patient want to make changes to medical advance directive? No - Patient declined -  [place]? No - copy requested -  Would patient like information on creating a medical advance directive? - No - Patient declined    Current Medications (verified) Outpatient Encounter Medications as of 12/26/2020  Medication Sig   acetaminophen (TYLENOL) 325 MG tablet Take 2 tablets (650 mg total) by mouth every 6 (six) hours as needed for mild pain (or temp > 100).   carvedilol (COREG) 3.125 MG tablet Take 1 tablet (3.125 mg total) by mouth 2 (two) times daily with a meal.   Cyanocobalamin (VITAMIN B 12 PO) Take 1 tablet by mouth daily.    potassium chloride SA (KLOR-CON) 20 MEQ tablet Take 1 tablet (20 mEq total) by mouth daily.   pravastatin (PRAVACHOL) 40 MG tablet Take 1 tablet (40 mg total) by mouth daily.   spironolactone (ALDACTONE) 25 MG tablet Take 1 tablet (25 mg total) by mouth daily.   Omega-3 Fatty Acids (FISH OIL) 1000 MG CAPS Take by mouth. Reported on 12/05/2015   No facility-administered encounter medications on file as of 12/26/2020.    Allergies (verified) Patient has no known allergies.   History: Past Medical History:  Diagnosis Date   Hyperlipidemia    Hypertension     Past Surgical History:  Procedure Laterality Date   LAPAROSCOPIC LYSIS OF ADHESIONS  09/08/2017   LAPAROSCOPY N/A 09/08/2017   Procedure: LYSIS OF ADHESIONS LAPAROSCOPIC;  Surgeon: Kinsinger, LArta Bruce MD;  [facility]  Service: General;  Laterality: N/A;   REPLACEMENT TOTAL KNEE Left    Family History  Problem Relation Age of Onset   Cancer Mother        STOMACH   Heart disease Father    Diabetes Sister    Diabetes Brother    Social History   Socioeconomic History   Marital status: Single    Spouse name: Not on file   Number of children: Not on file   Years of education: Not on file   Highest education level: Not on file  Occupational History   Not on file  Tobacco Use   Smoking status: Never   Smokeless tobacco: Never  Vaping Use   Vaping Use: Never used  Substance and Sexual Activity   Alcohol use: No    Alcohol/week: 0.0 standard drinks    Comment: occasional   Drug use: No   Sexual activity: Not Currently    Birth control/protection: None  Other Topics Concern   Not on file  Social History Narrative   Not on file   Social Determinants of Health   Financial Resource Strain: Low Risk    Difficulty of Paying Living Expenses: Not hard at all  Food  Insecurity: No Food Insecurity   Worried About Charity fundraiser in the Last Year: Never true   Ran Out of Food in the Last Year: Never true  Transportation Needs: No Transportation Needs   Lack of Transportation (Medical): No   Lack of Transportation (Non-Medical): No  Physical Activity: Sufficiently Active   Days of Exercise per Week: 5 days   Minutes of Exercise per Session: 30 min  Stress: No Stress Concern Present   Feeling of Stress : Not at all  Social Connections: Moderately Integrated   Frequency of Communication with Friends and Family: More than three times a week   Frequency of Social Gatherings with Friends and Family: More than three times a week   Attends Religious Services: More than 4  times per year   Active Member of [employer] or Organizations: Yes   Attends Archivist Meetings: More than 4 times per year   Marital Status: Widowed    Tobacco Counseling Counseling given: Not Answered   Clinical Intake:  Pre-visit preparation completed: Yes  Pain : 0-10 Pain Score: 7  Pain Type: Acute pain Pain Location: Back Pain Orientation: Lower Pain Radiating Towards: bilateral hips Pain Descriptors / Indicators: Discomfort Pain Onset: More than a month ago Pain Frequency: Intermittent Pain Relieving Factors: Tylenol 325 mg Effect of Pain on Daily Activities: Pain can diminish job performance, lower motivation to exercise, and prevent you from completing daily tasks. Pain produces disability and affects the quality of life.  Pain Relieving Factors: Tylenol 325 mg  BMI - recorded: 24.55 Nutritional Status: BMI of 19-24  Normal Nutritional Risks: None Diabetes: No  How often do you need to have someone help you when you read instructions, pamphlets, or other written materials from your doctor or pharmacy?: 1 - Never What is the last grade level you completed in school?: Bachelor's Degree from [employer]  Diabetic? no  Interpreter Needed?: No  Information entered by :: Lisette Abu, LPN   Activities of Daily Living In your present state of health, do you have any difficulty performing the following activities: 12/26/2020 06/29/2020  Hearing? N N  Vision? N N  Difficulty concentrating or making decisions? N Y  Walking or climbing stairs? N N  Dressing or bathing? N N  Doing errands, shopping? N N  Preparing Food and eating ? N -  Using the Toilet? N -  In the past six months, have you accidently leaked urine? N -  Do you have problems with loss of bowel control? N -  Managing your Medications? N -  Managing your Finances? N -  Housekeeping or managing your Housekeeping? N -  Some recent data might be hidden    Patient Care Team: [name], MD as  PCP - General (Internal Medicine) [name], MD as PCP - Cardiology (Cardiology)  Indicate any recent Medical Services you may have received from other than [facility] providers in the past year (date may be approximate).     Assessment:   This is a routine wellness examination for [name].  Hearing/Vision screen Hearing Screening - Comments:: Patient denied any hearing difficulty. Vision Screening - Comments:: Patient denied any vision issues.    Dietary issues and exercise activities discussed: Current Exercise Habits: Home exercise routine, Type of exercise: walking, Time (Minutes): 30, Frequency (Times/Week): 5, Weekly Exercise (Minutes/Week): 150, Intensity: Moderate, Exercise limited by: orthopedic condition(s)   Goals Addressed  This Visit's Progress     Patient Stated (pt-stated)        Continue to increase my water intake and watch my diet.      Depression Screen PHQ 2/9 Scores 12/26/2020 09/19/2020 06/29/2020 08/13/2019 07/31/2018 07/15/2018  PHQ - 2 Score 0 0 0 0 0 0  PHQ- 9 Score - - - 0 - -    Fall Risk Fall Risk  12/26/2020 09/19/2020 06/29/2020 08/13/2019 07/31/2018  Falls in the past year? 1 0 0 0 0  Number falls in past yr: 0 0 0 0 0  Injury with Fall? 1 0 0 0 0  Risk for fall due to : Other (Comment) - No Fall Risks - -  Risk for fall due to: Comment Patient fell on train - - - -  Follow up Falls evaluation completed - Falls evaluation completed - -    FALL RISK PREVENTION PERTAINING TO THE HOME:  Any stairs in or around the home? Yes  If so, are there any without handrails? No  Home free of loose throw rugs in walkways, pet beds, electrical cords, etc? Yes  Adequate lighting in your home to reduce risk of falls? Yes   ASSISTIVE DEVICES UTILIZED TO PREVENT FALLS:  Life alert? No  Use of a cane, walker or w/c? No  Grab bars in the bathroom? No  Shower chair or bench in shower? No  Elevated toilet seat or a handicapped toilet? No   TIMED UP AND  GO:  Was the test performed? Yes .  Length of time to ambulate 10 feet: 6 sec.   Gait steady and fast without use of assistive device  Cognitive Function: Normal cognitive status assessed by direct observation by this Nurse Health Advisor. No abnormalities found.          Immunizations Immunization History  Administered Date(s) Administered   Fluad Quad(high Dose 65+) 02/19/2020   Influenza, High Dose Seasonal PF 07/31/2018   PFIZER(Purple Top)SARS-COV-2 Vaccination 07/16/2019, 08/11/2019, 02/17/2020, 09/07/2020   Pneumococcal Polysaccharide-23 09/19/2020   Td 05/21/2009   Tdap 12/19/2011   Zoster Recombinat (Shingrix) 07/10/2017    TDAP status: Up to date  Flu Vaccine status: Up to date  Pneumococcal vaccine status: Up to date  Covid-19 vaccine status: Completed vaccines  Qualifies for Shingles Vaccine? Yes   Zostavax completed Yes   Shingrix Completed?: No.    Education has been provided regarding the importance of this vaccine. Patient has been advised to call insurance company to determine out of pocket expense if they have not yet received this vaccine. Advised may also receive vaccine at local pharmacy or Health Dept. Verbalized acceptance and understanding.  Screening Tests Health Maintenance  Topic Date Due   Zoster Vaccines- Shingrix (2 of 2) 09/04/2017   INFLUENZA VACCINE  12/19/2020   PNA vac Low Risk Adult (2 of 2 - PCV13) 09/19/2021   TETANUS/TDAP  12/18/2021   MAMMOGRAM  03/29/2022   DEXA SCAN  Completed   COVID-19 Vaccine  Completed   Hepatitis C Screening  Completed   HPV VACCINES  Aged Out   COLONOSCOPY (Pts 45-45yr Insurance coverage will need to be confirmed)  Discontinued    Health Maintenance  Health Maintenance Due  Topic Date Due   Zoster Vaccines- Shingrix (2 of 2) 09/04/2017   INFLUENZA VACCINE  12/19/2020    Colorectal cancer screening: No longer required.   Mammogram status: Completed 03/29/2020. Repeat every year  Bone  Density status: Completed 07/28/2018. Results reflect: Bone density  results: NORMAL. Repeat every 2-5 years.  Lung Cancer Screening: (Low Dose CT Chest recommended if Age 42-80 years, 30 pack-year currently smoking OR have quit w/in 15years.) does not qualify.   Lung Cancer Screening Referral: no  Additional Screening:  Hepatitis C Screening: does qualify; Completed yes  Vision Screening: Recommended annual ophthalmology exams for early detection of glaucoma and other disorders of the eye. Is the patient up to date with their annual eye exam?  Yes  Who is the provider or what is the name of the office in which the patient attends annual eye exams? [facility] If pt is not established with a provider, would they like to be referred to a provider to establish care? No .   Dental Screening: Recommended annual dental exams for proper oral hygiene  Community Resource Referral / Chronic Care Management: CRR required this visit?  No   CCM required this visit?  No      Plan:     I have personally reviewed and noted the following in the patient's chart:   Medical and social history Use of alcohol, tobacco or illicit drugs  Current medications and supplements including opioid prescriptions.  Functional ability and status Nutritional status Physical activity Advanced directives List of other physicians Hospitalizations, surgeries, and ER visits in previous 12 months Vitals Screenings to include cognitive, depression, and falls Referrals and appointments  In addition, I have reviewed and discussed with patient certain preventive protocols, quality metrics, and best practice recommendations. A written personalized care plan for preventive services as well as general preventive health recommendations were provided to patient.     Sheral Flow, LPN   [id number]   Nurse Notes: n/a

## 2020-12-26 NOTE — Patient Instructions (Signed)

## 2020-12-26 NOTE — Progress Notes (Signed)
Subjective:  Patient ID: [name], female    DOB: November 28, 1945  Age: 75 y.o. MRN: [id number]  CC: Hypertension  This visit occurred during the SARS-CoV-2 public health emergency.  Safety protocols were in place, including screening questions prior to the visit, additional usage of staff PPE, and extensive cleaning of exam room while observing appropriate contact time as indicated for disinfecting solutions.    HPI [name] presents for f/up -  She fell on a train about 2-1/2 months ago and continues to have shoulder pain.  She tells me the shoulders have not been x-rayed.  She is controlling the pain with Tylenol.  She also complains of chronic nonradiating low back pain.  She is active and denies any recent episodes of chest pain, shortness of breath, diaphoresis, dizziness, or lightheadedness.  Dr. Ronnald Ramp,   I decided to send this to you in case my mom can't remember it all. Her appointment is at 9:45 AM. Hopefully u will get it. I'll call your front desk tomorrow to make sure u receive it.   1.) Pain in Right Shoulder due to falling on the train because of a sudden stop 2.) Also still having pain in my Left Shoulder 3.) Pain in both Hips 4.) Loser back pain (Started in Jan 2022) 5.) Cold hands (2 weeks ago) 6.) Weak Bladder (I get up 3-4 times a night) 7.) I seem to be losing weight (Should I drink Ensure?) 8.) My memory is a little fuzzy.   Thanks you, [name] (On be    Outpatient Medications Prior to Visit  Medication Sig Dispense Refill   acetaminophen (TYLENOL) 325 MG tablet Take 2 tablets (650 mg total) by mouth every 6 (six) hours as needed for mild pain (or temp > 100).     Cyanocobalamin (VITAMIN B 12 PO) Take 1 tablet by mouth daily.      Omega-3 Fatty Acids (FISH OIL) 1000 MG CAPS Take by mouth. Reported on 12/05/2015     potassium chloride SA (KLOR-CON) 20 MEQ tablet Take 1 tablet (20 mEq total) by mouth daily. 90 tablet 0    pravastatin (PRAVACHOL) 40 MG tablet Take 1 tablet (40 mg total) by mouth daily. 90 tablet 1   carvedilol (COREG) 3.125 MG tablet Take 1 tablet (3.125 mg total) by mouth 2 (two) times daily with a meal. 180 tablet 0   spironolactone (ALDACTONE) 25 MG tablet Take 1 tablet (25 mg total) by mouth daily. 90 tablet 0   No facility-administered medications prior to visit.    ROS Review of Systems  Constitutional:  Negative for diaphoresis, fatigue and unexpected weight change.  HENT: Negative.    Respiratory:  Negative for cough, chest tightness, shortness of breath and wheezing.   Cardiovascular:  Negative for chest pain, palpitations and leg swelling.  Gastrointestinal:  Negative for abdominal pain, diarrhea, nausea and vomiting.  Endocrine: Positive for polyuria. Negative for polydipsia and polyphagia.  Genitourinary:  Positive for frequency and urgency. Negative for decreased urine volume, dysuria, flank pain and hematuria.  Musculoskeletal:  Positive for arthralgias and back pain. Negative for myalgias and neck pain.  Neurological: Negative.  Negative for dizziness, weakness and headaches.  Hematological:  Negative for adenopathy. Does not bruise/bleed easily.  Psychiatric/Behavioral:  Positive for decreased concentration. Negative for confusion and sleep disturbance. The patient is not nervous/anxious.    Objective:  BP 134/86 (BP Location: Left Arm, Patient Position: Sitting, Cuff Size: Normal)   Pulse 74   Temp  97.8 F (36.6 C) (Oral)   Resp 16   Ht '5\' 4"'$  (1.626 m)   Wt 143 lb (64.9 kg)   SpO2 98%   BMI 24.55 kg/m   BP Readings from Last 3 Encounters:  12/26/20 134/86  12/26/20 122/80  09/19/20 122/68    Wt Readings from Last 3 Encounters:  12/26/20 143 lb (64.9 kg)  12/26/20 143 lb (64.9 kg)  09/19/20 138 lb 6.4 oz (62.8 kg)    Physical Exam Vitals reviewed.  Constitutional:      Appearance: Normal appearance.  HENT:     Nose: Nose normal.     Mouth/Throat:      Mouth: Mucous membranes are moist.  Eyes:     General: No scleral icterus.    Conjunctiva/sclera: Conjunctivae normal.  Cardiovascular:     Rate and Rhythm: Normal rate and regular rhythm.     Heart sounds: No murmur heard. Pulmonary:     Effort: Pulmonary effort is normal.     Breath sounds: No stridor. No wheezing, rhonchi or rales.  Abdominal:     General: Abdomen is flat.     Palpations: There is no mass.     Tenderness: There is no abdominal tenderness. There is no guarding.  Musculoskeletal:        General: Normal range of motion.     Right shoulder: Normal. No swelling or tenderness. Normal range of motion.     Left shoulder: Normal. No swelling or tenderness. Normal range of motion.     Cervical back: Normal and neck supple.     Thoracic back: Normal.     Lumbar back: Normal.     Right lower leg: No edema.     Left lower leg: No edema.  Lymphadenopathy:     Cervical: No cervical adenopathy.  Skin:    General: Skin is warm and dry.  Neurological:     General: No focal deficit present.     Mental Status: She is alert and oriented to person, place, and time. Mental status is at baseline.  Psychiatric:        Mood and Affect: Mood normal.        Behavior: Behavior normal.    Lab Results  Component Value Date   WBC 4.0 09/19/2020   HGB 12.0 09/19/2020   HGB 12.3 09/19/2020   HCT 36.8 09/19/2020   HCT 36.5 09/19/2020   PLT 183.0 09/19/2020   GLUCOSE 96 09/19/2020   CHOL 179 06/29/2020   TRIG 96.0 06/29/2020   HDL 53.60 06/29/2020   LDLCALC 106 (H) 06/29/2020   ALT 18 06/29/2020   AST 27 06/29/2020   NA 142 09/19/2020   K 3.9 09/19/2020   CL 104 09/19/2020   CREATININE 0.93 09/19/2020   BUN 26 (H) 09/19/2020   CO2 31 09/19/2020   TSH 1.25 09/19/2020   INR 1.6 (H) 11/01/2006   HGBA1C 5.9 08/13/2019    MM 3D SCREEN BREAST BILATERAL  Result Date: 03/30/2020 CLINICAL DATA:  Screening. EXAM: DIGITAL SCREENING BILATERAL MAMMOGRAM WITH TOMO AND CAD  COMPARISON:  Previous exam(s). ACR Breast Density Category b: There are scattered areas of fibroglandular density. FINDINGS: There are no findings suspicious for malignancy. Images were processed with CAD. IMPRESSION: No mammographic evidence of malignancy. A result letter of this screening mammogram will be mailed directly to the patient. RECOMMENDATION: Screening mammogram in one year. (Code:SM-B-01Y) BI-RADS CATEGORY  1: Negative. Electronically Signed   By: Abelardo Diesel M.D.   On: 03/30/2020 15:25  Narrative & Impression  CLINICAL DATA:  Right shoulder pain after fall 3 months ago   EXAM: RIGHT SHOULDER - 2+ VIEW   COMPARISON:  None.   FINDINGS: Frontal, transscapular, and axillary views of the right shoulder are obtained. No acute fracture. There is severe acromioclavicular and glenohumeral joint osteoarthritis, with joint space narrowing, osteophyte formation, bony remodeling most pronounced in the glenohumeral joint. Visualized portions of the right chest are clear.   IMPRESSION: 1. Severe right shoulder osteoarthritis.  No acute fracture.     Electronically Signed   By: Randa Ngo M.D.   On: 12/26/2020 17:43    Assessment & Plan:   Amee was seen today for hypertension.  Diagnoses and all orders for this visit:  Chronic pain of both shoulders- Her x-ray shows severe arthritis in the right shoulder.  I recommended that she see orthopedics to consider surgical options. -     DG Shoulder Left; Future -     DG Shoulder Right; Future -     Ambulatory referral to Orthopedic Surgery  OAB (overactive bladder) -     mirabegron ER (MYRBETRIQ) 25 MG TB24 tablet; Take 1 tablet (25 mg total) by mouth daily. -     Urinalysis, Routine w reflex microscopic; Future -     CULTURE, URINE COMPREHENSIVE; Future -     CULTURE, URINE COMPREHENSIVE -     Urinalysis, Routine w reflex microscopic  Chronic bilateral low back pain without sciatica -     DG Lumbar Spine Complete;  Future  Memory difficulties -     Ambulatory referral to Neurology  Essential hypertension, benign- Her blood pressure is well controlled.  Electrolytes and renal function are normal. -     carvedilol (COREG) 3.125 MG tablet; Take 1 tablet (3.125 mg total) by mouth 2 (two) times daily with a meal. -     spironolactone (ALDACTONE) 25 MG tablet; Take 1 tablet (25 mg total) by mouth daily.  Hypokalemia -     spironolactone (ALDACTONE) 25 MG tablet; Take 1 tablet (25 mg total) by mouth daily.  Primary osteoarthritis involving multiple joints -     Ambulatory referral to Orthopedic Surgery  I am having [name] start on mirabegron ER. I am also having her maintain her Fish Oil, Cyanocobalamin (VITAMIN B 12 PO), acetaminophen, pravastatin, potassium chloride SA, carvedilol, and spironolactone.  Meds ordered this encounter  Medications   mirabegron ER (MYRBETRIQ) 25 MG TB24 tablet    Sig: Take 1 tablet (25 mg total) by mouth daily.    Dispense:  90 tablet    Refill:  0   carvedilol (COREG) 3.125 MG tablet    Sig: Take 1 tablet (3.125 mg total) by mouth 2 (two) times daily with a meal.    Dispense:  180 tablet    Refill:  0   spironolactone (ALDACTONE) 25 MG tablet    Sig: Take 1 tablet (25 mg total) by mouth daily.    Dispense:  90 tablet    Refill:  0      Follow-up: Return in about 3 months (around 03/28/2021).  Scarlette Calico, MD

## 2020-12-28 LAB — CULTURE, URINE COMPREHENSIVE

## 2020-12-30 ENCOUNTER — Telehealth: Payer: Self-pay | Admitting: Internal Medicine

## 2020-12-30 NOTE — Telephone Encounter (Signed)
Please advise once you have reviewed results.

## 2020-12-30 NOTE — Telephone Encounter (Signed)
Patient daughter would like provider or nurse to give her a call to go over patients recent test results

## 2021-01-02 NOTE — Telephone Encounter (Signed)
Called pt's daughter, Solmon Ice, LVM.

## 2021-01-02 NOTE — Telephone Encounter (Signed)
Pt's daughter has been informed of results and expressed understanding.

## 2021-01-05 NOTE — Progress Notes (Signed)
Assessment/Plan:   [name] is a 75 y.o. year old female with risk factors including, hypertension, hyperlipidemia, iron deficiency anemia, chronic pain due to severe osteoarthritis, nocturia, seen today for evaluation of memory loss.  MoCA today is 15/30 =MMSE 22/30  with deficiencies in visuospatial/executive, naming, memory, attention, language,  and delayed recall 0 /5, orientation 5 /6 (year is 2020).    Recommendations:     Memory loss likely due to Mild Cognitive Impairment   MRI brain with/without contrast to assess for underlying structural abnormality and assess vascular load  Neurocognitive testing to further evaluate cognitive concerns and determine underlying cause of memory changes, including potential contribution from sleep, anxiety, or depression  May consider start Donepezil after reviewing her MRI of the brain  Check B12 today Discussed safety both in and out of the home.  Discussed the importance of regular daily schedule with inclusion of crossword puzzles to maintain brain function.  Stay active at least 30 minutes at least 3 times a week.  Naps should be scheduled and should be no longer than 60 minutes and should not occur after 2 PM.  Mediterranean diet is recommended  Folllow up once results above are available   Subjective:    The patient is seen in neurologic consultation at the request of [name] Lima, MD for the evaluation of memory.  The patient is here alone.She is   a 75 y.o. year old female who has had memory issues for about one year.  She states that she cannot recall as well forgetting new information after a few minutes.Her long term memory is good.  During this time, she has been writing "anything I need to remember because otherwise I will not ".  She recently has told her family about these changes, as she was "trying to figure out what was going".  She denies any changes in mood, or history of depression or anxiety.  She denies  irritability.  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 She does not sleep well for the last year, as she wakes up several times to urinate between 2-4 times a night.  She denies any vivid dreams or sleepwalking, hallucinations, paranoia, or leaving objects in unusual places.  At times she cannot find her keys to the house or to the car, so she has been keeping them in a necklace.  She is independent of dressing and bathing.  At times, she forgets to take medications, but has not done so lately because she writes it down . She denies forgetting to pay bills.  Her appetite is normal, but she has lost some weight since she is not going to eat out as often as before.  Denies any trouble swallowing. She loves to cook, and denies leaving the stove or the faucet on.  She ambulates without difficulty without the use of a walker or a cane, but she does admit that is hard at times because of the constant arthritic pain.  She suffered a fall in May 31 when the train she was in, coming from [place] to [place] made a shortstop, and she hurt her knee, but did not lose consciousness, or hit her head.  She never had a concussion or  seizures. She continues to drive, denies getting lost, unless she takes an unfamiliar road where she needs the GPS. Denies headaches, double vision, dizziness, focal numbness or tingling, unilateral weakness or tremors, denies constipation or diarrhea.  She has anosmia for the last 2 years, denies having had COVID.  She denies a history of sleep apnea, alcohol, or tobacco.  She denies family history of dementia.     Labs 09/19/20 BMP normal CBC normal TSH normal 1.25 Iron 86, normal Ferritin 31.1 on the lower normal B1: 12 on the  lower normal Magnesium 2.2 normal LDH normal 202, reticulocyte's 0.8%, normal, fetal hemoglobin less than 1%, normal, hemoglobin A2 2.6, normal, hemoglobin A, 97.4, normal, haptoglobin normal 116  No Known Allergies  Current Outpatient Medications  Medication Instructions   acetaminophen (TYLENOL) 650 mg, Oral, Every 6 hours PRN   carvedilol (COREG) 3.125 mg, Oral, 2 times daily with meals   Cyanocobalamin (VITAMIN B 12 PO) 1 tablet, Oral, Daily   mirabegron ER (MYRBETRIQ) 25 mg, Oral, Daily   Omega-3 Fatty Acids (FISH OIL) 1000 MG CAPS Take by mouth. Reported on 12/05/2015   potassium chloride SA (KLOR-CON) 20 MEQ tablet 20 mEq, Oral, Daily   pravastatin (PRAVACHOL) 40 mg, Oral, Daily   spironolactone (ALDACTONE) 25 mg, Oral, Daily        PHYSICAL EXAM   HEENT:  Normocephalic, atraumatic. The mucous membranes are moist. The superficial temporal arteries are without ropiness or tenderness. Cardiovascular: Regular rate and rhythm. Lungs: Clear to auscultation bilaterally. Neck: There are no carotid bruits noted bilaterally.  NEUROLOGICAL: Montreal Cognitive Assessment  01/06/2021  Visuospatial/ Executive (0/5) 2  Naming (0/3) 2  Attention: Read list of digits (0/2) 1  Attention: Read list of letters (0/1) 0  Attention: Serial 7 subtraction starting at 100 (0/3) 3  Language: Repeat phrase (0/2) 0  Language : Fluency (0/1) 1  Abstraction (0/2) 1  Delayed Recall (0/5) 0  Orientation (0/6) 5  Total 15  Adjusted Score (based on education) 15   No flowsheet data found.  No flowsheet data found.   Orientation:  Alert and oriented to person, place. The year is 2020,  No aphasia or dysarthria. Fund of knowledge is reduced. Recent memory impaired and remote memory intact.  Attention and concentration are reduced.  Able to name objects 2/3 and unable to  repeat phrases. Delayed recall  0/5 Cranial nerves: There is good facial symmetry. Extraocular muscles are intact and visual fields  are full to confrontational testing. Speech is fluent and clear. Soft palate rises symmetrically and there is no tongue deviation. Hearing is intact to conversational tone. Tone: Tone is good throughout. Sensation: Sensation is intact to light touch and pinprick throughout. Vibration is intact at the bilateral big toe.There is no extinction with double simultaneous stimulation. There is no sensory dermatomal level identified. Coordination: The patient has no difficulty with RAM's or FNF bilaterally. Normal finger to nose  Motor: Strength is 5/5 in the bilateral upper and lower extremities. There is no pronator drift. There are no fasciculations noted. DTR's: Deep tendon reflexes are 2/4 at the bilateral biceps, triceps, brachioradialis, patella and achilles.  Plantar responses are downgoing bilaterally. Gait and Station: The patient is able to ambulate without difficulty.The patient is able to heel toe walk without any difficulty.The patient is able to ambulate in a tandem fashion. The patient is able to stand in the Romberg position.     Thank you for allowing [name] the opportunity to participate in the  care of this nice patient. Please do not hesitate to contact us for any questions or concerns.   Total time spent on today's visit was 60 minutes, including both face-to-face time and nonface-to-face time.  Time included that spent on review of records (prior notes available to me/labs/imaging if pertinent), discussing treatment and goals, answering patient's questions and coordinating care.  Cc:  [name] Lima, MD  Sharene Butters 01/06/2021 11:37 AM

## 2021-01-06 ENCOUNTER — Other Ambulatory Visit: Payer: Self-pay

## 2021-01-06 ENCOUNTER — Encounter: Payer: Self-pay | Admitting: Physician Assistant

## 2021-01-06 ENCOUNTER — Other Ambulatory Visit (INDEPENDENT_AMBULATORY_CARE_PROVIDER_SITE_OTHER): Payer: Medicare HMO

## 2021-01-06 ENCOUNTER — Ambulatory Visit (INDEPENDENT_AMBULATORY_CARE_PROVIDER_SITE_OTHER): Payer: Medicare HMO | Admitting: Physician Assistant

## 2021-01-06 VITALS — BP 126/77 | HR 81 | Ht 64.0 in | Wt 135.6 lb

## 2021-01-06 DIAGNOSIS — G3184 Mild cognitive impairment, so stated: Secondary | ICD-10-CM | POA: Diagnosis not present

## 2021-01-06 DIAGNOSIS — R413 Other amnesia: Secondary | ICD-10-CM | POA: Diagnosis not present

## 2021-01-06 LAB — VITAMIN B12: Vitamin B-12: >1550 pg/mL — ABNORMAL HIGH (ref 211–911)

## 2021-01-06 NOTE — Patient Instructions (Addendum)
It was a pleasure to see you today at our office.   Recommendations:  Neurocognitive evaluation at our office to further evaluate cognitive concerns and determine underlying cause of memory changes, including potential contribution from sleep, anxiety, or depression  MRI of the brain, the radiology office will call you to arrange you appointment Check labs today, Vit B12  Will consider starting a memory medication, Donepezil after the results of the MRI  Follow up once the results of the above are available   RECOMMENDATIONS FOR ALL PATIENTS WITH MEMORY PROBLEMS: 1. Continue to exercise (Recommend 30 minutes of walking everyday, or 3 hours every week) 2. Increase social interactions - continue going to [place] and enjoy social gatherings with friends and family 3. Eat healthy, avoid fried foods and eat more fruits and vegetables 4. Maintain adequate blood pressure, blood sugar, and blood cholesterol level. Reducing the risk of stroke and cardiovascular disease also helps promoting better memory. 5. Avoid stressful situations. Live a simple life and avoid aggravations. Organize your time and prepare for the next day in anticipation. 6. Sleep well, avoid any interruptions of sleep and avoid any distractions in the bedroom that may interfere with adequate sleep quality 7. Avoid sugar, avoid sweets as there is a strong link between excessive sugar intake, diabetes, and cognitive impairment We discussed the Mediterranean diet, which has been shown to help patients reduce the risk of progressive memory disorders and reduces cardiovascular risk. This includes eating fish, eat fruits and green leafy vegetables, nuts like almonds and hazelnuts, walnuts, and also use olive oil. Avoid fast foods and fried foods as much as possible. Avoid sweets and sugar as sugar use has been linked to worsening of memory function.  There is always a concern of gradual progression of memory problems. If this is the case,  then we may need to adjust level of care according to patient needs. Support, both to the patient and caregiver, should then be put into place.      You have been referred for a neuropsychological evaluation (i.e., evaluation of memory and thinking abilities). Please bring someone with you to this appointment if possible, as it is helpful for the doctor to hear from both you and another adult who knows you well. Please bring eyeglasses and hearing aids if you wear them.    The evaluation will take approximately 3 hours and has two parts:   The first part is a clinical interview with the neuropsychologist (Dr. Melvyn Novas or Dr. Nicole Kindred). During the interview, the neuropsychologist will speak with you and the individual you brought to the appointment.    The second part of the evaluation is testing with the doctor's technician Hinton Dyer or Maudie Mercury). During the testing, the technician will ask you to remember different types of material, solve problems, and answer some questionnaires. Your family member will not be present for this portion of the evaluation.   Please note: We must reserve several hours of the neuropsychologist's time and the psychometrician's time for your evaluation appointment. As such, there is a No-Show fee of $100. If you are unable to attend any of your appointments, please contact our office as soon as possible to reschedule.    FALL PRECAUTIONS: Be cautious when walking. Scan the area for obstacles that may increase the risk of trips and falls. When getting up in the mornings, sit up at the edge of the bed for a few minutes before getting out of bed. Consider elevating the bed at the head end  to avoid drop of blood pressure when getting up. Walk always in a well-lit room (use night lights in the walls). Avoid area rugs or power cords from appliances in the middle of the walkways. Use a walker or a cane if necessary and consider physical therapy for balance exercise. Get your eyesight checked  regularly.  FINANCIAL OVERSIGHT: Supervision, especially oversight when making financial decisions or transactions is also recommended.  HOME SAFETY: Consider the safety of the kitchen when operating appliances like stoves, microwave oven, and blender. Consider having supervision and share cooking responsibilities until no longer able to participate in those. Accidents with firearms and other hazards in the house should be identified and addressed as well.   ABILITY TO BE LEFT ALONE: If patient is unable to contact 911 operator, consider using LifeLine, or when the need is there, arrange for someone to stay with patients. Smoking is a fire hazard, consider supervision or cessation. Risk of wandering should be assessed by caregiver and if detected at any point, supervision and safe proof recommendations should be instituted.  MEDICATION SUPERVISION: Inability to self-administer medication needs to be constantly addressed. Implement a mechanism to ensure safe administration of the medications.   DRIVING: Regarding driving, in patients with progressive memory problems, driving will be impaired. We advise to have someone else do the driving if trouble finding directions or if minor accidents are reported. Independent driving assessment is available to determine safety of driving.   If you are interested in the driving assessment, you can contact the following:  [employer] in [place]  [place] [phone] or [phone]    [place] refers to food and lifestyle choices that are based on the traditions of countries located on the [employer]. This way of eating has been shown to help prevent certain conditions and improve outcomes for people who have chronic diseases, like kidney disease and heart disease. What are tips for following this  plan? Lifestyle  Cook and eat meals together with your family, when possible. Drink enough fluid to keep your urine clear or pale yellow. Be physically active every day. This includes: Aerobic exercise like running or swimming. Leisure activities like gardening, walking, or housework. Get 7-8 hours of sleep each night. If recommended by your health care provider, drink red wine in moderation. This means 1 glass a day for nonpregnant women and 2 glasses a day for men. A glass of wine equals 5 oz (150 mL). Reading food labels  Check the serving size of packaged foods. For foods such as rice and pasta, the serving size refers to the amount of cooked product, not dry. Check the total fat in packaged foods. Avoid foods that have saturated fat or trans fats. Check the ingredients list for added sugars, such as corn syrup. Shopping  At the grocery store, buy most of your food from the areas near the walls of the store. This includes: Fresh fruits and vegetables (produce). Grains, beans, nuts, and seeds. Some of these may be available in unpackaged forms or large amounts (in bulk). Fresh seafood. Poultry and eggs. Low-fat dairy products. Buy whole ingredients instead of prepackaged foods. Buy fresh fruits and vegetables in-season from local farmers markets. Buy frozen fruits and vegetables in resealable bags. If you do not have access to quality fresh seafood, buy precooked frozen shrimp or canned fish, such as tuna, salmon, or sardines. Buy small amounts of raw  or cooked vegetables, salads, or olives from the deli or salad bar at your store. Stock your pantry so you always have certain foods on hand, such as olive oil, canned tuna, canned tomatoes, rice, pasta, and beans. Cooking  Cook foods with extra-virgin olive oil instead of using butter or other vegetable oils. Have meat as a side dish, and have vegetables or grains as your main dish. This means having meat in small portions or adding  small amounts of meat to foods like pasta or stew. Use beans or vegetables instead of meat in common dishes like chili or lasagna. Experiment with different cooking methods. Try roasting or broiling vegetables instead of steaming or sauteing them. Add frozen vegetables to soups, stews, pasta, or rice. Add nuts or seeds for added healthy fat at each meal. You can add these to yogurt, salads, or vegetable dishes. Marinate fish or vegetables using olive oil, lemon juice, garlic, and fresh herbs. Meal planning  Plan to eat 1 vegetarian meal one day each week. Try to work up to 2 vegetarian meals, if possible. Eat seafood 2 or more times a week. Have healthy snacks readily available, such as: Vegetable sticks with hummus. Greek yogurt. Fruit and nut trail mix. Eat balanced meals throughout the week. This includes: Fruit: 2-3 servings a day Vegetables: 4-5 servings a day Low-fat dairy: 2 servings a day Fish, poultry, or lean meat: 1 serving a day Beans and legumes: 2 or more servings a week Nuts and seeds: 1-2 servings a day Whole grains: 6-8 servings a day Extra-virgin olive oil: 3-4 servings a day Limit red meat and sweets to only a few servings a month What are my food choices? Mediterranean diet Recommended Grains: Whole-grain pasta. Brown rice. Bulgar wheat. Polenta. Couscous. Whole-wheat bread. [place]. Vegetables: Artichokes. Beets. Broccoli. Cabbage. Carrots. Eggplant. Green beans. Chard. Kale. Spinach. Onions. Leeks. Peas. Squash. Tomatoes. Peppers. Radishes. Fruits: Apples. Apricots. Avocado. Berries. Bananas. Cherries. Dates. Figs. Grapes. Lemons. Melon. Oranges. Peaches. Plums. Pomegranate. Meats and other protein foods: Beans. Almonds. Sunflower seeds. Pine nuts. Peanuts. [place]. Salmon. Scallops. Shrimp. [place]. Tilapia. Clams. Oysters. Eggs. Dairy: Low-fat milk. Cheese. Greek yogurt. Beverages: Water. Red wine. Herbal tea. Fats and oils: Extra virgin olive oil. Avocado  oil. Grape seed oil. Sweets and desserts: [place] yogurt with honey. Baked apples. Poached pears. Trail mix. Seasoning and other foods: Basil. Cilantro. Coriander. Cumin. Mint. Parsley. Sage. Rosemary. Tarragon. Garlic. Oregano. Thyme. Pepper. Balsalmic vinegar. Tahini. Hummus. Tomato sauce. Olives. Mushrooms. Limit these Grains: Prepackaged pasta or rice dishes. Prepackaged cereal with added sugar. Vegetables: Deep fried potatoes (french fries). Fruits: Fruit canned in syrup. Meats and other protein foods: Beef. Pork. Lamb. Poultry with skin. Hot dogs. Bernie[place] Salines. Dairy: Ice cream. Sour cream. Whole milk. Beverages: Juice. Sugar-sweetened soft drinks. Beer. Liquor and spirits. Fats and oils: Butter. Canola oil. Vegetable oil. Beef fat (tallow). Lard. Sweets and desserts: Cookies. Cakes. Pies. Candy. Seasoning and other foods: Mayonnaise. Premade sauces and marinades. The items listed may not be a complete list. Talk with your dietitian about what dietary choices are right for you. Summary The Mediterranean diet includes both food and lifestyle choices. Eat a variety of fresh fruits and vegetables, beans, nuts, seeds, and whole grains. Limit the amount of red meat and sweets that you eat. Talk with your health care provider about whether it is safe for you to drink red wine in moderation. This means 1 glass a day for nonpregnant women and 2 glasses a day for men. A glass  of wine equals 5 oz (150 mL). This information is not intended to replace advice given to you by your health care provider. Make sure you discuss any questions you have with your health care provider. Document Released: 12/29/2015 Document Revised: 01/31/2016 Document Reviewed: 12/29/2015 Elsevier Interactive Patient Education  2017 [employer].

## 2021-01-11 ENCOUNTER — Telehealth: Payer: Self-pay | Admitting: Internal Medicine

## 2021-01-17 ENCOUNTER — Ambulatory Visit (INDEPENDENT_AMBULATORY_CARE_PROVIDER_SITE_OTHER): Payer: Medicare HMO | Admitting: Orthopaedic Surgery

## 2021-01-17 ENCOUNTER — Encounter: Payer: Self-pay | Admitting: Orthopaedic Surgery

## 2021-01-17 DIAGNOSIS — M19011 Primary osteoarthritis, right shoulder: Secondary | ICD-10-CM

## 2021-01-17 DIAGNOSIS — M19012 Primary osteoarthritis, left shoulder: Secondary | ICD-10-CM | POA: Diagnosis not present

## 2021-01-17 HISTORY — DX: Primary osteoarthritis, left shoulder: M19.012

## 2021-01-17 HISTORY — DX: Primary osteoarthritis, right shoulder: M19.011

## 2021-01-17 NOTE — Progress Notes (Signed)
Office Visit Note   Patient: [name]           Date of Birth: 12-10-1945           MRN: [id number] Visit Date: 01/17/2021              Requested by: [name], MD [id number] [place],  [place] [id number] PCP: [name], MD   Assessment & Plan: Visit Diagnoses:  1. Primary osteoarthritis of left shoulder   2. Primary osteoarthritis of right shoulder     Plan: Recommend intra-articular injections both shoulders with corticosteroids under fluoroscopy.  We will work on setting this up for her.  Only other option would be shoulder replacement.  At this point time patient is not interested in any type of surgical intervention.  Questions were encouraged and answered by Dr. Caryl Comes and myself.  She will follow-up with [name] as needed.  Follow-Up Instructions: No follow-ups on file.   Orders:  No orders of the defined types were placed in this encounter.  No orders of the defined types were placed in this encounter.     Procedures: No procedures performed   Clinical Data: No additional findings.   Subjective: Chief Complaint  Patient presents with   Right Shoulder - Pain   Left Shoulder - Pain    HPI Patient is a 75 year old female comes in today with bilateral shoulder pain.  She reports she fell on a train onto her right shoulder sometime the end of this May.  She has right greater than left shoulder pain ranks her pain to be 6 out of 10 pain at worst.  She denies any numbness tingling down her right arm.  States the pain sometimes is not as bad as others.  She is using Voltaren gel on the shoulders unsure if this helps.  She has had no other treatment.  She did have radiographs both shoulders on 12/26/2020.  These are reviewed by myself and Dr. Ninfa Linden.  These showed end-stage arthritis with periarticular spurring and loose bodies within the glenohumeral joints.  Complete loss of joint space on the axillary views bilaterally.  Patient is nondiabetic.   She does have memory loss with mild cognitive impairment.  Review of Systems  Constitutional:  Negative for chills and fever.    Objective: Vital Signs: There were no vitals taken for this visit.  Physical Exam General well-developed well-nourished female no acute distress. Psych alert and oriented to person and place. Ortho Exam Bilateral shoulders past very and actively full forward flexion.  Significant crepitus bilateral shoulders with external and internal rotation.  5 /5 strength with external and internal rotation against resistance bilaterally.  Empty can test is negative bilaterally.  Specialty Comments:  No specialty comments available.  Imaging: No results found.   PMFS History: Patient Active Problem List   Diagnosis Date Noted   Chronic pain of both shoulders 12/26/2020   OAB (overactive bladder) 12/26/2020   Chronic bilateral low back pain without sciatica 12/26/2020   Mild cognitive impairment 12/26/2020   Polyp of colon 09/19/2020   Encounter for general adult medical examination with abnormal findings 09/19/2020   Tremor, coarse 09/19/2020   Hypokalemia 06/29/2020   Hyperlipidemia with target LDL less than 130 06/29/2020   Primary osteoarthritis involving multiple joints 06/29/2020   LAE (left atrial enlargement) 06/29/2020   Deficiency anemia 06/29/2020   ESSENTIAL HYPERTENSION, BENIGN 07/11/2009   Past Medical History:  Diagnosis Date   Hyperlipidemia  Hypertension     Family History  Problem Relation Age of Onset   Cancer Mother        STOMACH   Heart disease Father    Diabetes Sister    Diabetes Brother     Past Surgical History:  Procedure Laterality Date   LAPAROSCOPIC LYSIS OF ADHESIONS  09/08/2017   LAPAROSCOPY N/A 09/08/2017   Procedure: LYSIS OF ADHESIONS LAPAROSCOPIC;  Surgeon: Kieth Brightly, Arta Bruce, MD;  Location: [facility];  Service: General;  Laterality: N/A;   REPLACEMENT TOTAL KNEE Left    Social History   Occupational History    Not on file  Tobacco Use   Smoking status: Never   Smokeless tobacco: Never  Vaping Use   Vaping Use: Never used  Substance and Sexual Activity   Alcohol use: No    Alcohol/week: 0.0 standard drinks    Comment: occasional   Drug use: No   Sexual activity: Not Currently    Birth control/protection: None

## 2021-01-25 ENCOUNTER — Ambulatory Visit (INDEPENDENT_AMBULATORY_CARE_PROVIDER_SITE_OTHER): Payer: BC Managed Care – PPO | Admitting: Psychology

## 2021-01-25 ENCOUNTER — Encounter: Payer: Self-pay | Admitting: Psychology

## 2021-01-25 ENCOUNTER — Ambulatory Visit: Payer: BC Managed Care – PPO

## 2021-01-25 ENCOUNTER — Other Ambulatory Visit: Payer: Self-pay

## 2021-01-25 DIAGNOSIS — G309 Alzheimer's disease, unspecified: Secondary | ICD-10-CM | POA: Diagnosis not present

## 2021-01-25 DIAGNOSIS — G3184 Mild cognitive impairment, so stated: Secondary | ICD-10-CM

## 2021-01-25 DIAGNOSIS — R4189 Other symptoms and signs involving cognitive functions and awareness: Secondary | ICD-10-CM

## 2021-01-25 DIAGNOSIS — F0670 Mild neurocognitive disorder due to known physiological condition without behavioral disturbance: Secondary | ICD-10-CM

## 2021-01-25 NOTE — Progress Notes (Signed)
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 of a forensic evaluation.   Ms. Basil was provided with a verbal description of the nature and purpose of the present neuropsychological evaluation. Also reviewed were the foreseeable risks and/or discomforts and benefits of the procedure, limits of confidentiality, and mandatory reporting requirements of this provider. The patient was given the opportunity to ask questions and receive answers about the evaluation. Oral consent to participate was provided by the patient.   This evaluation was conducted by Christia Reading, Ph.D., licensed clinical neuropsychologist. Ms. Grandville Silos completed a clinical interview with Dr. Melvyn Novas, billed as one unit [phone], and 120 minutes of cognitive testing and scoring, billed as one unit [phone] and three additional units [id number]. Psychometrist Cruzita Lederer, B.S., assisted Dr. Melvyn Novas with test administration and scoring procedures. As a separate and discrete service, Dr. Melvyn Novas spent a total of 160 minutes in interpretation and report writing billed as one unit [phone]  and two units 96133.

## 2021-01-25 NOTE — Progress Notes (Signed)
   Psychometrician Note   Cognitive testing was administered to Laurier Nancy by Cruzita Lederer, B.S. (psychometrist) under the supervision of Dr. Christia Reading, Ph.D., licensed psychologist on 01/25/2021. Ms. Pettus did not appear overtly distressed by the testing session per behavioral observation or responses across self-report questionnaires. Rest breaks were offered.    The battery of tests administered was selected by Dr. Christia Reading, Ph.D. with consideration to Ms. Laroque's current level of functioning, the nature of her symptoms, emotional and behavioral responses during interview, level of literacy, observed level of motivation/effort, and the nature of the referral question. This battery was communicated to the psychometrist. Communication between Dr. Christia Reading, Ph.D. and the psychometrist was ongoing throughout the evaluation and Dr. Christia Reading, Ph.D. was immediately accessible at all times. Dr. Christia Reading, Ph.D. provided supervision to the psychometrist on the date of this service to the extent necessary to assure the quality of all services provided.    Laurier Nancy will return within approximately 1-2 weeks for an interactive feedback session with Dr. Melvyn Novas at which time her test performances, clinical impressions, and treatment recommendations will be reviewed in detail. Ms. Altemus understands she can contact our office should she require our assistance before this time.  A total of 120 minutes of billable time were spent face-to-face with Ms. Spinella by the psychometrist. This includes both test administration and scoring time. Billing for these services is reflected in the clinical report generated by Dr. Christia Reading, Ph.D.  This note reflects time spent with the psychometrician and does not include test scores or any clinical interpretations made by Dr. Melvyn Novas. The full report will follow in a separate note.

## 2021-02-05 ENCOUNTER — Other Ambulatory Visit: Payer: TRICARE For Life (TFL)

## 2021-02-05 ENCOUNTER — Other Ambulatory Visit: Payer: Self-pay

## 2021-02-05 ENCOUNTER — Ambulatory Visit
Admission: RE | Admit: 2021-02-05 | Discharge: 2021-02-05 | Disposition: A | Discharge status: Home / Self Care | Payer: TRICARE For Life (TFL) | Source: Ambulatory Visit | Attending: Physician Assistant | Admitting: Physician Assistant

## 2021-02-05 DIAGNOSIS — R413 Other amnesia: Secondary | ICD-10-CM | POA: Diagnosis not present

## 2021-02-13 ENCOUNTER — Ambulatory Visit (INDEPENDENT_AMBULATORY_CARE_PROVIDER_SITE_OTHER): Payer: Medicare HMO | Admitting: Psychology

## 2021-02-13 ENCOUNTER — Other Ambulatory Visit: Payer: Self-pay

## 2021-02-13 DIAGNOSIS — F0670 Mild neurocognitive disorder due to known physiological condition without behavioral disturbance: Secondary | ICD-10-CM

## 2021-02-13 DIAGNOSIS — F0281 Dementia in other diseases classified elsewhere with behavioral disturbance: Secondary | ICD-10-CM

## 2021-02-13 DIAGNOSIS — G309 Alzheimer's disease, unspecified: Secondary | ICD-10-CM

## 2021-02-13 DIAGNOSIS — G3184 Mild cognitive impairment, so stated: Secondary | ICD-10-CM

## 2021-02-13 NOTE — Progress Notes (Signed)
   Neuropsychology Feedback Session [name]. [facility]  Reason for Referral:   [name] is a 75 y.o. right-handed African-American female referred by  Sharene Butters, PA-C , to characterize her current cognitive functioning and assist with diagnostic clarity and treatment planning in the context of subjective cognitive decline and poor performance on a brief cognitive screening instrument.   Feedback:   [name] completed a comprehensive neuropsychological evaluation on 01/25/2021. Please refer to that encounter for the full report and recommendations. Briefly, results suggested severe impairment surrounding all aspects of learning and memory. Additional impairments were exhibited across confrontation naming and several aspects of executive functioning (namely cognitive flexibility and response inhibition). A relative weakness was further exhibited across semantic fluency and receptive language. Regarding etiology, I have concerns surrounding the presence of Alzheimer's disease. Across memory tasks, [name] had significant trouble learning new information and did not benefit from repeated exposure. After a delay, she was essentially amnestic across all assessments. Performance across yes/no recognition tasks was mildly variable, but overall below expectation as she benefited from correct guessing at times. Overall, memory performance suggests a strong storage deficit, which is the hallmark characteristic of this disease process. Additionally, impairments in confrontation naming and a noted discrepancy between semantic and phonemic fluency performances (with the former being consistently worse) is also very consistent with expected patterns of dysfunction with this disease process.  [name] was unaccompanied during the current feedback session. Content of the current session focused on the results of her neuropsychological evaluation.  [name] was given the opportunity to ask questions and her questions were answered. She was encouraged to reach out should additional questions arise. A copy of her report was provided at the conclusion of the visit.      18 minutes were spent conducting the current feedback session with [name], billed as one unit [phone].

## 2021-02-17 ENCOUNTER — Other Ambulatory Visit: Payer: Self-pay

## 2021-02-17 ENCOUNTER — Ambulatory Visit (INDEPENDENT_AMBULATORY_CARE_PROVIDER_SITE_OTHER): Payer: BC Managed Care – PPO | Admitting: Physician Assistant

## 2021-02-17 VITALS — BP 141/85 | HR 79 | Ht 64.0 in | Wt 141.4 lb

## 2021-02-17 DIAGNOSIS — G3184 Mild cognitive impairment, so stated: Secondary | ICD-10-CM | POA: Diagnosis not present

## 2021-02-17 DIAGNOSIS — G309 Alzheimer's disease, unspecified: Secondary | ICD-10-CM

## 2021-02-17 MED ORDER — DONEPEZIL HCL 10 MG PO TABS: ORAL_TABLET | ORAL | 11 refills | Status: DC

## 2021-02-17 NOTE — Progress Notes (Signed)
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 Discussed safety both in and out of the home.  Discussed the importance of regular daily schedule with inclusion of crossword puzzles to maintain brain function.  Continue to monitor mood. Say active exercising  at least 30 minutes at least 3 times a week.  Naps should be scheduled and should be no longer than 60 minutes and should not occur after 2 PM.  Mediterranean diet is recommended  We discussed that there is no evidence that medications such as Donepezil have any significant effect in MCI and sometimes side effects are hard to tolerate. They can alter the slope of progression for a short period of time in dementia and patients may have better function longer.  Side effects include nausea, vomiting, diarrhea, vivid dreams, and muscle cramps.  Patient agrees and knows to call the office if any of these symptoms appear.  We will start donepezil (Aricept) 5mg  daily for 2 weeks.  If tolerated  will increase the dose to 10mg  daily.   Repeat the neurocognitive testing in 18 months    Follow up in 6 months.   Case discussed with Dr. Delice Lesch who agrees with the plan     Subjective:      [name]  is a 75 y.o.female with a history of hypertension, hyperlipidemia, chronic pain due to severe osteoarthritis, seen today for evaluation of memory loss.  [facility] tin 12/2020 was15/30 =MMSE 22/30 . Her last visit was on 01/06/21. This patient is here alone. Previous records as well as any outside records available were reviewed prior to todays visit.  Since that time, she had a Neurocognitive exam in 01/2021, yielding a diagnosis of Mild neurocognitive disorder likely due to Alzheimer's Disease.  She reports that her memory "is about the same", but she is trying to make a schedule for her activities to help her stay organized. She  continues to live alone but her sister check on her frequently.  Her mood is good, denies depression or irritability.  He has fun doing coloring books, reading and testing herself, if she does not do well, she will read the book again.  She likes to do gardening, likes [employer], and speak to missionaries every Sunday.  She also likes to sing solo in church.  She sleeps well, denies vivid dreams or sleepwalking.  Denies hallucinations or paranoia, or leaving objects in unusual places.  She is independent of bathing and dressing, she takes her medications denies missing any doses, as well as denies having any issues with bill payments.  Her appetite is good, denies trouble swallowing.  She does not cook as often, but when doing so, denies leaving the stove or the faucet on.  She ambulates without difficulty, without the use of a walker or a cane, and likes to exercise at home.  She denies any falls or head injuries.  She drives with GPS and denies getting lost.  She denies any headaches, anosmia, double vision, dizziness, focal numbness or tingling, unilateral weakness or tremors, urine incontinence, retention, constipation or diarrhea.    Initial evaluation 02/06/2021 the patient is seen in neurologic consultation at the request of [name], MD for the evaluation of memory.  The patient is here alone.She is   a 75 y.o. year old female who has had memory issues for about one year.  She states that she  cannot recall as well forgetting new information after a few minutes.Her long term memory is good.  During this time, she has been writing "anything I need to remember because otherwise I will not ".  She recently has told her family about these changes, as she was "trying to figure out what was going".  She denies any changes in mood, or history of depression or anxiety.  She denies irritability.  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 She does not sleep well for the last year, as she wakes up several times to urinate between 2-4 times a night.  She denies any vivid dreams or sleepwalking, hallucinations, paranoia, or leaving objects in unusual places.  At times she cannot find her keys to the house or to the car, so she has been keeping them in a necklace.  She is independent of dressing and bathing.  At times, she forgets to take medications, but has not done so lately because she writes it down . She denies forgetting to pay bills.  Her appetite is normal, but she has lost some weight since she is not going to eat out as often as before.  Denies any trouble swallowing. She loves to cook, and denies leaving the stove or the faucet on.  She ambulates without difficulty without the use of a walker or a cane, but she does admit that is hard at times because of the constant arthritic pain.  She suffered a fall in May 31 when the train she was in, coming from [place] to [place] made a shortstop, and she hurt her knee, but did not lose consciousness, or hit her head.  She never had a concussion or seizures. She continues to drive, denies getting lost, unless she takes an unfamiliar road where she needs the GPS. Denies headaches, double vision, dizziness, focal numbness or tingling, unilateral weakness or tremors, denies constipation or diarrhea.  She has anosmia for the last 2 years, denies having had COVID.  She denies a history of sleep apnea, alcohol, or tobacco.  She denies family history of dementia.  Neurocognitive evaluation 01/25/2021 Dr. Melvyn Novas "results suggested severe impairment surrounding all aspects of learning and memory. Additional impairments were exhibited across confrontation naming and several aspects of executive  functioning (namely cognitive flexibility and response inhibition). A relative weakness was further exhibited across semantic fluency and receptive language. Regarding etiology, I have concerns surrounding the presence of Alzheimer's disease. Across memory tasks, Ms. Hallgren had significant trouble learning new information and did not benefit from repeated exposure. After a delay, she was essentially amnestic across all assessments. Performance across yes/no recognition tasks was mildly variable, but overall below expectation as she benefited from correct guessing at times. Overall, memory performance suggests a strong storage deficit, which is the hallmark characteristic of this disease process. Additionally, impairments in confrontation naming and a noted discrepancy between semantic and phonemic fluency performances (with the former being consistently worse) is also very consistent with expected patterns of dysfunction with this disease process."       Labs 09/19/20 BMP normal CBC normal TSH normal 1.25 Iron 86, normal Ferritin 31.1 on the lower normal B1: 12 on the lower normal Magnesium 2.2 normal LDH normal 202, reticulocyte's 0.8%,  normal, fetal hemoglobin less than 1%, normal, hemoglobin A2 2.6, normal, hemoglobin A, 97.4, normal, haptoglobin normal 116   MRI brain 02/05/21 No acute intracranial abnormality. 2. Moderate chronic small vessel ischemic disease  PREVIOUS MEDICATIONS:   CURRENT MEDICATIONS:  Outpatient Encounter Medications as of 02/17/2021  Medication Sig   acetaminophen (TYLENOL) 325 MG tablet Take 2 tablets (650 mg total) by mouth every 6 (six) hours as needed for mild pain (or temp > 100).   carvedilol (COREG) 3.125 MG tablet Take 1 tablet (3.125 mg total) by mouth 2 (two) times daily with a meal.   Cyanocobalamin (VITAMIN B 12 PO) Take 1 tablet by mouth daily.    mirabegron ER (MYRBETRIQ) 25 MG TB24 tablet Take 1 tablet (25 mg total) by mouth daily.   Omega-3 Fatty  Acids (FISH OIL) 1000 MG CAPS Take by mouth. Reported on 12/05/2015   potassium chloride SA (KLOR-CON) 20 MEQ tablet Take 1 tablet (20 mEq total) by mouth daily.   pravastatin (PRAVACHOL) 40 MG tablet Take 1 tablet (40 mg total) by mouth daily.   spironolactone (ALDACTONE) 25 MG tablet Take 1 tablet (25 mg total) by mouth daily.   No facility-administered encounter medications on file as of 02/17/2021.       Objective:     PHYSICAL EXAMINATION:    VITALS:   Vitals:   02/17/21 0754  BP: (!) 141/85  Pulse: 79  SpO2: 99%  Weight: 141 lb 6.4 oz (64.1 kg)  Height: 5\' 4"  (1.626 m)    GEN:  The patient appears stated age and is in NAD. HEENT:  Normocephalic, atraumatic.   Neurological examination:  General: NAD, well-groomed, appears stated age. Orientation: The patient is alert. Oriented to person, place and not to date Cranial nerves: There is good facial symmetry.The speech is fluent and clear. No aphasia or dysarthria. Fund of knowledge is appropriate. Recent and remote memory are impaired. Attention and concentration are reduced.  Able to name objects and repeat phrases.  Hearing is intact to conversational tone.    Sensation: Sensation is intact to light touch throughout Motor: Strength is at least antigravity x4. Tremors: none  DTR's 2/4 in [facility] Cognitive Assessment  01/06/2021  Visuospatial/ Executive (0/5) 2  Naming (0/3) 2  Attention: Read list of digits (0/2) 1  Attention: Read list of letters (0/1) 0  Attention: Serial 7 subtraction starting at 100 (0/3) 3  Language: Repeat phrase (0/2) 0  Language : Fluency (0/1) 1  Abstraction (0/2) 1  Delayed Recall (0/5) 0  Orientation (0/6) 5  Total 15  Adjusted Score (based on education) 15   No flowsheet data found.  No flowsheet data found.    Movement examination: Tone: There is normal tone in the UE/LE Abnormal movements:  no tremor.  No myoclonus.  No asterixis.   Coordination:  There is no  decremation with RAM's. Normal finger to nose  Gait and Station: The patient has no difficulty arising out of a deep-seated chair without the use of the hands. The patient's stride length is good.  Gait is cautious and narrow.          Total time spent on today's visit was 30 minutes, including both face-to-face time and nonface-to-face time.  Time included that spent on review of records (prior notes available to me/labs/imaging if pertinent), discussing treatment and goals, answering patient's questions and coordinating care.  Cc:  [name], MD Sharene Butters, PA-C

## 2021-02-17 NOTE — Patient Instructions (Signed)
It was a pleasure to see you today at our office.   Recommendations:  Meds: Follow up in  6 months We will start donepezil half tablet (5mg ) daily for 2  weeks.  If you are tolerating the medication, then after 2 weeks, we will increase the dose to a full tablet of 10 mg daily.  Side effects include nausea, vomiting, diarrhea, vivid dreams, and muscle cramps.  Please call the clinic if you experience any of these symptoms.    RECOMMENDATIONS FOR ALL PATIENTS WITH MEMORY PROBLEMS: 1. Continue to exercise (Recommend 30 minutes of walking everyday, or 3 hours every week) 2. Increase social interactions - continue going to [place] and enjoy social gatherings with friends and family 3. Eat healthy, avoid fried foods and eat more fruits and vegetables 4. Maintain adequate blood pressure, blood sugar, and blood cholesterol level. Reducing the risk of stroke and cardiovascular disease also helps promoting better memory. 5. Avoid stressful situations. Live a simple life and avoid aggravations. Organize your time and prepare for the next day in anticipation. 6. Sleep well, avoid any interruptions of sleep and avoid any distractions in the bedroom that may interfere with adequate sleep quality 7. Avoid sugar, avoid sweets as there is a strong link between excessive sugar intake, diabetes, and cognitive impairment We discussed the Mediterranean diet, which has been shown to help patients reduce the risk of progressive memory disorders and reduces cardiovascular risk. This includes eating fish, eat fruits and green leafy vegetables, nuts like almonds and hazelnuts, walnuts, and also use olive oil. Avoid fast foods and fried foods as much as possible. Avoid sweets and sugar as sugar use has been linked to worsening of memory function.  There is always a concern of gradual progression of memory problems. If this is the case, then we may need to adjust level of care according to patient needs. Support, both to the  patient and caregiver, should then be put into place.    The [employer] Association is here all day, every day for people facing Alzheimer's disease through our free 24/7 Helpline: [phone]. The Helpline provides reliable information and support to all those who need assistance, such as individuals living with memory loss, Alzheimer's or other dementia, caregivers, health care professionals and the public.  Our highly trained and knowledgeable staff can help you with: Understanding memory loss, dementia and Alzheimer's  Medications and other treatment options  General information about aging and brain health  Skills to provide quality care and to find the best care from professionals  Legal, financial and living-arrangement decisions Our Helpline also features: Confidential care consultation provided by master's level clinicians who can help with decision-making support, crisis assistance and education on issues families face every day  Help in a caller's preferred language using our translation service that features more than 200 languages and dialects  Referrals to local community programs, services and ongoing support     FALL PRECAUTIONS: Be cautious when walking. Scan the area for obstacles that may increase the risk of trips and falls. When getting up in the mornings, sit up at the edge of the bed for a few minutes before getting out of bed. Consider elevating the bed at the head end to avoid drop of blood pressure when getting up. Walk always in a well-lit room (use night lights in the walls). Avoid area rugs or power cords from appliances in the middle of the walkways. Use a walker or a cane if necessary and consider physical therapy  for balance exercise. Get your eyesight checked regularly.  FINANCIAL OVERSIGHT: Supervision, especially oversight when making financial decisions or transactions is also recommended.  HOME SAFETY: Consider the safety of the kitchen when operating  appliances like stoves, microwave oven, and blender. Consider having supervision and share cooking responsibilities until no longer able to participate in those. Accidents with firearms and other hazards in the house should be identified and addressed as well.   ABILITY TO BE LEFT ALONE: If patient is unable to contact 911 operator, consider using LifeLine, or when the need is there, arrange for someone to stay with patients. Smoking is a fire hazard, consider supervision or cessation. Risk of wandering should be assessed by caregiver and if detected at any point, supervision and safe proof recommendations should be instituted.  MEDICATION SUPERVISION: Inability to self-administer medication needs to be constantly addressed. Implement a mechanism to ensure safe administration of the medications.   DRIVING: Regarding driving, in patients with progressive memory problems, driving will be impaired. We advise to have someone else do the driving if trouble finding directions or if minor accidents are reported. Independent driving assessment is available to determine safety of driving.   If you are interested in the driving assessment, you can contact the following:  [employer] in [place]  [place] [phone] or [phone]      [place] refers to food and lifestyle choices that are based on the traditions of countries located on the [employer]. This way of eating has been shown to help prevent certain conditions and improve outcomes for people who have chronic diseases, like kidney disease and heart disease. What are tips for following this plan? Lifestyle  Cook and eat meals together with your family, when possible. Drink enough fluid to keep your urine clear or pale yellow. Be physically active every day. This includes: Aerobic  exercise like running or swimming. Leisure activities like gardening, walking, or housework. Get 7-8 hours of sleep each night. If recommended by your health care provider, drink red wine in moderation. This means 1 glass a day for nonpregnant women and 2 glasses a day for men. A glass of wine equals 5 oz (150 mL). Reading food labels  Check the serving size of packaged foods. For foods such as rice and pasta, the serving size refers to the amount of cooked product, not dry. Check the total fat in packaged foods. Avoid foods that have saturated fat or trans fats. Check the ingredients list for added sugars, such as corn syrup. Shopping  At the grocery store, buy most of your food from the areas near the walls of the store. This includes: Fresh fruits and vegetables (produce). Grains, beans, nuts, and seeds. Some of these may be available in unpackaged forms or large amounts (in bulk). Fresh seafood. Poultry and eggs. Low-fat dairy products. Buy whole ingredients instead of prepackaged foods. Buy fresh fruits and vegetables in-season from local farmers markets. Buy frozen fruits and vegetables in resealable bags. If you do not have access to quality fresh seafood, buy precooked frozen shrimp or canned fish, such as tuna, salmon, or sardines. Buy small amounts of raw or cooked vegetables, salads, or olives from the deli or salad bar at your store. Stock your pantry so you always have certain foods on hand, such as olive oil, canned tuna, canned tomatoes, rice, pasta, and beans. Cooking  Cook foods with extra-virgin olive  oil instead of using butter or other vegetable oils. Have meat as a side dish, and have vegetables or grains as your main dish. This means having meat in small portions or adding small amounts of meat to foods like pasta or stew. Use beans or vegetables instead of meat in common dishes like chili or lasagna. Experiment with different cooking methods. Try roasting or broiling  vegetables instead of steaming or sauteing them. Add frozen vegetables to soups, stews, pasta, or rice. Add nuts or seeds for added healthy fat at each meal. You can add these to yogurt, salads, or vegetable dishes. Marinate fish or vegetables using olive oil, lemon juice, garlic, and fresh herbs. Meal planning  Plan to eat 1 vegetarian meal one day each week. Try to work up to 2 vegetarian meals, if possible. Eat seafood 2 or more times a week. Have healthy snacks readily available, such as: Vegetable sticks with hummus. Greek yogurt. Fruit and nut trail mix. Eat balanced meals throughout the week. This includes: Fruit: 2-3 servings a day Vegetables: 4-5 servings a day Low-fat dairy: 2 servings a day Fish, poultry, or lean meat: 1 serving a day Beans and legumes: 2 or more servings a week Nuts and seeds: 1-2 servings a day Whole grains: 6-8 servings a day Extra-virgin olive oil: 3-4 servings a day Limit red meat and sweets to only a few servings a month What are my food choices? Mediterranean diet Recommended Grains: Whole-grain pasta. Brown rice. Bulgar wheat. Polenta. Couscous. Whole-wheat bread. [place]. Vegetables: Artichokes. Beets. Broccoli. Cabbage. Carrots. Eggplant. Green beans. Chard. Kale. Spinach. Onions. Leeks. Peas. Squash. Tomatoes. Peppers. Radishes. Fruits: Apples. Apricots. Avocado. Berries. Bananas. Cherries. Dates. Figs. Grapes. Lemons. Melon. Oranges. Peaches. Plums. Pomegranate. Meats and other protein foods: Beans. Almonds. Sunflower seeds. Pine nuts. Peanuts. [place]. Salmon. Scallops. Shrimp. [place]. Tilapia. Clams. Oysters. Eggs. Dairy: Low-fat milk. Cheese. Greek yogurt. Beverages: Water. Red wine. Herbal tea. Fats and oils: Extra virgin olive oil. Avocado oil. Grape seed oil. Sweets and desserts: [place] yogurt with honey. Baked apples. Poached pears. Trail mix. Seasoning and other foods: Basil. Cilantro. Coriander. Cumin. Mint. Parsley. Sage. Rosemary.  Tarragon. Garlic. Oregano. Thyme. Pepper. Balsalmic vinegar. Tahini. Hummus. Tomato sauce. Olives. Mushrooms. Limit these Grains: Prepackaged pasta or rice dishes. Prepackaged cereal with added sugar. Vegetables: Deep fried potatoes (french fries). Fruits: Fruit canned in syrup. Meats and other protein foods: Beef. Pork. Lamb. Poultry with skin. Hot dogs. [place] Salines. Dairy: Ice cream. Sour cream. Whole milk. Beverages: Juice. Sugar-sweetened soft drinks. Beer. Liquor and spirits. Fats and oils: Butter. Canola oil. Vegetable oil. Beef fat (tallow). Lard. Sweets and desserts: Cookies. Cakes. Pies. Candy. Seasoning and other foods: Mayonnaise. Premade sauces and marinades. The items listed may not be a complete list. Talk with your dietitian about what dietary choices are right for you. Summary The Mediterranean diet includes both food and lifestyle choices. Eat a variety of fresh fruits and vegetables, beans, nuts, seeds, and whole grains. Limit the amount of red meat and sweets that you eat. Talk with your health care provider about whether it is safe for you to drink red wine in moderation. This means 1 glass a day for nonpregnant women and 2 glasses a day for men. A glass of wine equals 5 oz (150 mL). This information is not intended to replace advice given to you by your health care provider. Make sure you discuss any questions you have with your health care provider. Document Released: 12/29/2015 Document Revised: 01/31/2016 Document Reviewed: 12/29/2015  Chartered certified accountant Patient Education  [employer].

## 2021-02-27 ENCOUNTER — Other Ambulatory Visit: Payer: Self-pay | Admitting: Internal Medicine

## 2021-02-27 DIAGNOSIS — Z1231 Encounter for screening mammogram for malignant neoplasm of breast: Secondary | ICD-10-CM

## 2021-03-27 ENCOUNTER — Encounter: Payer: Self-pay | Admitting: Internal Medicine

## 2021-03-27 ENCOUNTER — Ambulatory Visit (INDEPENDENT_AMBULATORY_CARE_PROVIDER_SITE_OTHER): Payer: Medicare Other | Admitting: Internal Medicine

## 2021-03-27 ENCOUNTER — Other Ambulatory Visit: Payer: Self-pay

## 2021-03-27 VITALS — BP 140/84 | HR 73 | Temp 98.5°F | Resp 16 | Ht 64.0 in | Wt 138.0 lb

## 2021-03-27 DIAGNOSIS — E876 Hypokalemia: Secondary | ICD-10-CM | POA: Diagnosis not present

## 2021-03-27 DIAGNOSIS — I10 Essential (primary) hypertension: Secondary | ICD-10-CM | POA: Diagnosis not present

## 2021-03-27 DIAGNOSIS — E2839 Other primary ovarian failure: Secondary | ICD-10-CM

## 2021-03-27 HISTORY — DX: Other primary ovarian failure: E28.39

## 2021-03-27 LAB — BASIC METABOLIC PANEL
BUN: 20 mg/dL (ref 6–23)
CO2: 32 mEq/L (ref 19–32)
Calcium: 9.3 mg/dL (ref 8.4–10.5)
Chloride: 106 mEq/L (ref 96–112)
Creatinine, Ser: 0.77 mg/dL (ref 0.40–1.20)
GFR: 75.68 mL/min (ref >60.00)
Glucose, Bld: 86 mg/dL (ref 70–99)
Potassium: 3.7 mEq/L (ref 3.5–5.1)
Sodium: 142 mEq/L (ref 135–145)

## 2021-03-27 MED ORDER — SPIRONOLACTONE 25 MG PO TABS: 25.0000 mg | ORAL_TABLET | Freq: Every day | ORAL | 1 refills | Status: DC

## 2021-03-27 NOTE — Patient Instructions (Signed)

## 2021-03-27 NOTE — Progress Notes (Signed)
Subjective:  Patient ID: [name], female    DOB: Feb 27, 1946  Age: 75 y.o. MRN: [id number]  CC: Hypertension  This visit occurred during the SARS-CoV-2 public health emergency.  Safety protocols were in place, including screening questions prior to the visit, additional usage of staff PPE, and extensive cleaning of exam room while observing appropriate contact time as indicated for disinfecting solutions.    HPI [name] presents for f/up -   Her blood pressure has been well controlled.  She is active and denies chest pain, shortness of breath, diaphoresis, dizziness, lightheadedness, edema, or fatigue.  Outpatient Medications Prior to Visit  Medication Sig Dispense Refill   acetaminophen (TYLENOL) 325 MG tablet Take 2 tablets (650 mg total) by mouth every 6 (six) hours as needed for mild pain (or temp > 100).     carvedilol (COREG) 3.125 MG tablet Take 1 tablet (3.125 mg total) by mouth 2 (two) times daily with a meal. 180 tablet 0   donepezil (ARICEPT) 10 MG tablet Take half tablet (5 mg) daily for 2 weeks, then increase to the full tablet at 10 mg daily 30 tablet 11   mirabegron ER (MYRBETRIQ) 25 MG TB24 tablet Take 1 tablet (25 mg total) by mouth daily. 90 tablet 0   Omega-3 Fatty Acids (FISH OIL) 1000 MG CAPS Take by mouth. Reported on 12/05/2015     potassium chloride SA (KLOR-CON) 20 MEQ tablet Take 1 tablet (20 mEq total) by mouth daily. 90 tablet 0   pravastatin (PRAVACHOL) 40 MG tablet Take 1 tablet (40 mg total) by mouth daily. 90 tablet 1   Cyanocobalamin (VITAMIN B 12 PO) Take 1 tablet by mouth daily.      spironolactone (ALDACTONE) 25 MG tablet Take 1 tablet (25 mg total) by mouth daily. 90 tablet 0   No facility-administered medications prior to visit.    ROS Review of Systems  Constitutional:  Negative for diaphoresis and fatigue.  HENT: Negative.    Eyes: Negative.   Respiratory:  Negative for cough, chest tightness, shortness of breath  and wheezing.   Cardiovascular:  Negative for chest pain, palpitations and leg swelling.  Gastrointestinal:  Negative for abdominal pain, constipation, diarrhea, nausea and vomiting.  Endocrine: Negative.   Genitourinary: Negative.  Negative for difficulty urinating.  Musculoskeletal: Negative.  Negative for arthralgias and myalgias.  Skin: Negative.  Negative for color change.  Neurological:  Positive for tremors. Negative for dizziness, weakness, light-headedness and headaches.  Hematological:  Negative for adenopathy. Does not bruise/bleed easily.  Psychiatric/Behavioral:  Positive for confusion. Negative for dysphoric mood and sleep disturbance. The patient is not nervous/anxious.    Objective:  BP 140/84 (BP Location: Right Arm, Patient Position: Sitting, Cuff Size: Large)   Pulse 73   Temp 98.5 F (36.9 C) (Oral)   Resp 16   Ht 5\' 4"  (1.626 m)   Wt 138 lb (62.6 kg)   SpO2 97%   BMI 23.69 kg/m   BP Readings from Last 3 Encounters:  03/27/21 140/84  02/17/21 (!) 141/85  01/06/21 126/77    Wt Readings from Last 3 Encounters:  03/27/21 138 lb (62.6 kg)  02/17/21 141 lb 6.4 oz (64.1 kg)  01/06/21 135 lb 9.6 oz (61.5 kg)    Physical Exam Vitals reviewed.  HENT:     Nose: Nose normal.     Mouth/Throat:     Mouth: Mucous membranes are moist.  Eyes:     Conjunctiva/sclera: Conjunctivae normal.  Cardiovascular:  Rate and Rhythm: Normal rate and regular rhythm.     Heart sounds: No murmur heard. Pulmonary:     Effort: Pulmonary effort is normal.     Breath sounds: No stridor. No wheezing, rhonchi or rales.  Abdominal:     General: Abdomen is flat.     Palpations: There is no mass.     Tenderness: There is no abdominal tenderness. There is no guarding.     Hernia: No hernia is present.  Musculoskeletal:        General: Normal range of motion.     Cervical back: Neck supple.     Right lower leg: No edema.     Left lower leg: No edema.  Skin:    General: Skin is  warm and dry.  Neurological:     General: No focal deficit present.     Mental Status: She is alert.  Psychiatric:        Mood and Affect: Mood normal.        Behavior: Behavior normal.    Lab Results  Component Value Date   WBC 4.0 09/19/2020   HGB 12.0 09/19/2020   HGB 12.3 09/19/2020   HCT 36.8 09/19/2020   HCT 36.5 09/19/2020   PLT 183.0 09/19/2020   GLUCOSE 86 03/27/2021   CHOL 179 06/29/2020   TRIG 96.0 06/29/2020   HDL 53.60 06/29/2020   LDLCALC 106 (H) 06/29/2020   ALT 18 06/29/2020   AST 27 06/29/2020   NA 142 03/27/2021   K 3.7 03/27/2021   CL 106 03/27/2021   CREATININE 0.77 03/27/2021   BUN 20 03/27/2021   CO2 32 03/27/2021   TSH 1.25 09/19/2020   INR 1.6 (H) 11/01/2006   HGBA1C 5.9 08/13/2019    MR BRAIN WO CONTRAST  Result Date: 02/05/2021 CLINICAL DATA:  Memory loss EXAM: MRI HEAD WITHOUT CONTRAST TECHNIQUE: Multiplanar, multiecho pulse sequences of the brain and surrounding structures were obtained without intravenous contrast. COMPARISON:  None. FINDINGS: Brain: No acute infarct, mass effect or extra-axial collection. No acute or chronic hemorrhage. Hyperintense T2-weighted signal is moderately widespread throughout the white matter. Parenchymal volume and CSF spaces are normal. The midline structures are normal. Vascular: Major flow voids are preserved. Skull and upper cervical spine: Normal calvarium and skull base. Visualized upper cervical spine and soft tissues are normal. Sinuses/Orbits:No paranasal sinus fluid levels or advanced mucosal thickening. No mastoid or middle ear effusion. Normal orbits. IMPRESSION: 1. No acute intracranial abnormality. 2. Moderate chronic small vessel ischemic disease. Electronically Signed   By: Ulyses Jarred M.D.   On: 02/05/2021 23:04    Assessment & Plan:   Liyanna was seen today for hypertension.  Diagnoses and all orders for this visit:  Essential hypertension, benign- Her blood pressure is adequately well  controlled. -     Basic metabolic panel; Future -     spironolactone (ALDACTONE) 25 MG tablet; Take 1 tablet (25 mg total) by mouth daily. -     Basic metabolic panel  Hypokalemia- Her potassium level is normal.  Will continue the current dose of spironolactone. -     Basic metabolic panel; Future -     spironolactone (ALDACTONE) 25 MG tablet; Take 1 tablet (25 mg total) by mouth daily. -     Basic metabolic panel  Estrogen deficiency -     DG Bone Density; Future  I have discontinued Aleea W. Leffel's Cyanocobalamin (VITAMIN B 12 PO). I am also having her maintain her Fish Oil, acetaminophen,  pravastatin, potassium chloride SA, mirabegron ER, carvedilol, donepezil, and spironolactone.  Meds ordered this encounter  Medications   spironolactone (ALDACTONE) 25 MG tablet    Sig: Take 1 tablet (25 mg total) by mouth daily.    Dispense:  90 tablet    Refill:  1      Follow-up: Return in about 6 months (around 09/24/2021).  Scarlette Calico, MD

## 2021-03-31 ENCOUNTER — Other Ambulatory Visit: Payer: Self-pay

## 2021-03-31 ENCOUNTER — Ambulatory Visit
Admission: RE | Admit: 2021-03-31 | Discharge: 2021-03-31 | Disposition: A | Discharge status: Home / Self Care | Payer: Medicare Other | Source: Ambulatory Visit | Attending: Internal Medicine | Admitting: Internal Medicine

## 2021-03-31 ENCOUNTER — Ambulatory Visit: Payer: TRICARE For Life (TFL)

## 2021-03-31 DIAGNOSIS — Z1231 Encounter for screening mammogram for malignant neoplasm of breast: Secondary | ICD-10-CM | POA: Diagnosis not present

## 2021-08-17 ENCOUNTER — Ambulatory Visit (INDEPENDENT_AMBULATORY_CARE_PROVIDER_SITE_OTHER): Payer: Medicare PPO | Admitting: Physician Assistant

## 2021-08-17 ENCOUNTER — Ambulatory Visit: Payer: TRICARE For Life (TFL) | Admitting: Physician Assistant

## 2021-08-17 ENCOUNTER — Encounter: Payer: Self-pay | Admitting: Physician Assistant

## 2021-08-17 VITALS — BP 146/99 | HR 77 | Resp 18 | Ht 64.0 in | Wt 144.0 lb

## 2021-08-17 DIAGNOSIS — F0670 Mild neurocognitive disorder due to known physiological condition without behavioral disturbance: Secondary | ICD-10-CM

## 2021-08-17 DIAGNOSIS — G309 Alzheimer's disease, unspecified: Secondary | ICD-10-CM | POA: Diagnosis not present

## 2021-08-17 MED ORDER — DONEPEZIL HCL 10 MG PO TABS: ORAL_TABLET | ORAL | 11 refills | Status: DC

## 2021-08-17 NOTE — Patient Instructions (Signed)
It was a pleasure to see you today at our office.  ? ?Recommendations: ? ?Meds: ?Follow up in  6 months ?Continue donepezil  1 full tablet of 10 mg daily.   ?Continue taking your vitamins ?Melatonin for sleep, bet 3-5 mg before bedtime ? ? ? ?RECOMMENDATIONS FOR ALL PATIENTS WITH MEMORY PROBLEMS: ?1. Continue to exercise (Recommend 30 minutes of walking everyday, or 3 hours every week) ?2. Increase social interactions - continue going to [place] and enjoy social gatherings with friends and family ?3. Eat healthy, avoid fried foods and eat more fruits and vegetables ?4. Maintain adequate blood pressure, blood sugar, and blood cholesterol level. Reducing the risk of stroke and cardiovascular disease also helps promoting better memory. ?5. Avoid stressful situations. Live a simple life and avoid aggravations. Organize your time and prepare for the next day in anticipation. ?6. Sleep well, avoid any interruptions of sleep and avoid any distractions in the bedroom that may interfere with adequate sleep quality ?7. Avoid sugar, avoid sweets as there is a strong link between excessive sugar intake, diabetes, and cognitive impairment ?We discussed the Mediterranean diet, which has been shown to help patients reduce the risk of progressive memory disorders and reduces cardiovascular risk. This includes eating fish, eat fruits and green leafy vegetables, nuts like almonds and hazelnuts, walnuts, and also use olive oil. Avoid fast foods and fried foods as much as possible. Avoid sweets and sugar as sugar use has been linked to worsening of memory function. ? ?There is always a concern of gradual progression of memory problems. If this is the case, then we may need to adjust level of care according to patient needs. Support, both to the patient and caregiver, should then be put into place.  ? ? ?The Alzheimer?s Association is here all day, every day for people facing Alzheimer?s disease through our free 24/7 Helpline:  [phone]. The Helpline provides reliable information and support to all those who need assistance, such as individuals living with memory loss, Alzheimer's or other dementia, caregivers, health care professionals and the public.  ?Our highly trained and knowledgeable staff can help you with: ?Understanding memory loss, dementia and Alzheimer's  ?Medications and other treatment options  ?General information about aging and brain health  ?Skills to provide quality care and to find the best care from professionals  ?Legal, financial and living-arrangement decisions ?Our Helpline also features: ?Confidential care consultation provided by master's level clinicians who can help with decision-making support, crisis assistance and education on issues families face every day  ?Help in a caller's preferred language using our translation service that features more than 200 languages and dialects  ?Referrals to local community programs, services and ongoing support ? ? ? ? ?FALL PRECAUTIONS: Be cautious when walking. Scan the area for obstacles that may increase the risk of trips and falls. When getting up in the mornings, sit up at the edge of the bed for a few minutes before getting out of bed. Consider elevating the bed at the head end to avoid drop of blood pressure when getting up. Walk always in a well-lit room (use night lights in the walls). Avoid area rugs or power cords from appliances in the middle of the walkways. Use a walker or a cane if necessary and consider physical therapy for balance exercise. Get your eyesight checked regularly. ? ?FINANCIAL OVERSIGHT: Supervision, especially oversight when making financial decisions or transactions is also recommended. ? ?HOME SAFETY: Consider the safety of the kitchen when operating appliances like  stoves, microwave oven, and blender. Consider having supervision and share cooking responsibilities until no longer able to participate in those. Accidents with firearms and  other hazards in the house should be identified and addressed as well. ? ? ?ABILITY TO BE LEFT ALONE: If patient is unable to contact 911 operator, consider using Life[facility], or when the need is there, arrange for someone to stay with patients. Smoking is a fire hazard, consider supervision or cessation. Risk of wandering should be assessed by caregiver and if detected at any point, supervision and safe proof recommendations should be instituted. ? ?MEDICATION SUPERVISION: Inability to self-administer medication needs to be constantly addressed. Implement a mechanism to ensure safe administration of the medications. ? ? ?DRIVING: Regarding driving, in patients with progressive memory problems, driving will be impaired. We advise to have someone else do the driving if trouble finding directions or if minor accidents are reported. Independent driving assessment is available to determine safety of driving. ? ? ?If you are interested in the driving assessment, you can contact the following: ? ?[employer] in [place] ? ?[place] [phone] ? ?[facility] [phone] ? ?[facility] [phone] or [phone] ? ?  ? ? ?Mediterranean Diet ?A Mediterranean diet refers to food and lifestyle choices that are based on the traditions of countries located on the [employer]. This way of eating has been shown to help prevent certain conditions and improve outcomes for people who have chronic diseases, like kidney disease and heart disease. ?What are tips for following this plan? ?Lifestyle  ?Cook and eat meals together with your family, when possible. ?Drink enough fluid to keep your urine clear or pale yellow. ?Be physically active every day. This includes: ?Aerobic exercise like running or swimming. ?Leisure activities like gardening, walking, or housework. ?Get 7-8 hours of sleep each night. ?If recommended by your health care provider, drink red wine in  moderation. This means 1 glass a day for nonpregnant women and 2 glasses a day for men. A glass of wine equals 5 oz (150 mL). ?Reading food labels  ?Check the serving size of packaged foods. For foods such as rice and pasta, the serving size refers to the amount of cooked product, not dry. ?Check the total fat in packaged foods. Avoid foods that have saturated fat or trans fats. ?Check the ingredients list for added sugars, such as corn syrup. ?Shopping  ?At the grocery store, buy most of your food from the areas near the walls of the store. This includes: ?Fresh fruits and vegetables (produce). ?Grains, beans, nuts, and seeds. Some of these may be available in unpackaged forms or large amounts (in bulk). ?Fresh seafood. ?Poultry and eggs. ?Low-fat dairy products. ?Buy whole ingredients instead of prepackaged foods. ?Buy fresh fruits and vegetables in-season from local farmers markets. ?Buy frozen fruits and vegetables in resealable bags. ?If you do not have access to quality fresh seafood, buy precooked frozen shrimp or canned fish, such as tuna, salmon, or sardines. ?Buy small amounts of raw or cooked vegetables, salads, or olives from the deli or salad bar at your store. ?Stock your pantry so you always have certain foods on hand, such as olive oil, canned tuna, canned tomatoes, rice, pasta, and beans. ?Cooking  ?Cook foods with extra-virgin olive oil instead of using butter or other vegetable oils. ?Have meat as a side dish, and have vegetables or grains as your main dish. This means having meat in small portions or adding small amounts of  meat to foods like pasta or stew. ?Use beans or vegetables instead of meat in common dishes like chili or lasagna. ?Experiment with different cooking methods. Try roasting or broiling vegetables instead of steaming or saut?eing them. ?Add frozen vegetables to soups, stews, pasta, or rice. ?Add nuts or seeds for added healthy fat at each meal. You can add these to yogurt,  salads, or vegetable dishes. ?Marinate fish or vegetables using olive oil, lemon juice, garlic, and fresh herbs. ?Meal planning  ?Plan to eat 1 vegetarian meal one day each week. Try to work up to 2 vegetarian mea

## 2021-08-17 NOTE — Progress Notes (Signed)
? ? ?Assessment/Plan:  ? ?Mild Neurocognitive Disorder likely  due to Alzheimer's Disease ? ?Discussed the diagnosis of dementia, likely due to Alzheimer's disease. ?Discussed safety both in and out of the home.  ?Discussed the importance of regular daily schedule with inclusion of crossword puzzles to maintain brain function.  ?Continue to monitor mood. ?Say active exercising  at least 30 minutes at least 3 times a week.  ?Naps should be scheduled and should be no longer than 60 minutes and should not occur after 2 PM. Agree with melatonin for sleep ?Mediterranean diet is recommended  ?Continue donepezil (Aricept) 10 mg daily.  Side effects discussed ?Repeat the neurocognitive testing in 12 months    ?Follow up in 6 months. ? ? ?Case discussed with [name] who agrees with the plan ? ? ? ? ?Subjective:  ? ? ?[name]  is a 76 y.o.female with a history of hypertension, hyperlipidemia, chronic pain due to severe osteoarthritis, seen today for evaluation of memory loss.  [facility] tin 12/2020 was15/30 Her last visit was on 02/17/21. Neurocognitive exam in 01/2021, yielded a diagnosis of Mild neurocognitive disorder likely due to Alzheimer's Disease.  This patient is here alone. Previous records as well as any outside records available were reviewed prior to todays visit.  Since that time, she had a reports that her memory "is better, the medicine is helping me" making schedule for her activities to help her stay organized.  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  She is also enjoying doing word finding, coloring books, reading and testing herself, reading Bible, going to church and [employer], speaking to missionaries every Sunday, and seeing solo in [place].  She is busy walking at least 1 mile a day, and do gardening.  She does not sleep very well, and is going to try melatonin.  She denies vivid dreams or sleepwalking,  hallucinations or paranoia.  She denies living objects in unusual places.  She is independent of bathing and dressing.  She is in charge of her medications, denies missing any doses by using a pillbox.  She does not miss any bills.  Her appetite is normal, eating better foods, denies trouble swallowing.  She does not cook as often, but when doing so, denies leaving the stove or the faucet on.  She ambulates without difficulty, without the use of a walker or a cane, and likes to exercise at home.  She denies any falls or head injuries.  She drives only short distances.  She denies any headaches, anosmia, double vision, dizziness, focal numbness or tingling, unilateral weakness or tremors, urine incontinence, retention, constipation or diarrhea. ? ? ? ?Initial evaluation 02/06/2021 the patient is seen in neurologic consultation at the request of [name] for the evaluation of memory.  The patient is here alone.She is   a 76 y.o. year old female who has had memory issues for about one year.  She states that she cannot recall as well forgetting new information after a few minutes.Her long term memory is good.  During this time, she has been writing "anything I need to remember because otherwise I will not ".  She recently has told her family about these changes, as she was "trying to figure out what was going".  She denies any changes in mood, or history of depression or anxiety.  She denies irritability.  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 ?She does not sleep well for the last year, as she wakes up several times to urinate between 2-4 times a night.  She denies any vivid dreams or sleepwalking, hallucinations, paranoia, or leaving objects  in unusual places.  At times she cannot find her keys to the house or to the car, so she has been keeping them in a necklace.  She is independent of dressing and bathing.  At times, she forgets to take medications, but has not done so lately because she writes it down . She denies forgetting to pay bills.  Her appetite is normal, but she has lost some weight since she is not going to eat out as often as before.  Denies any trouble swallowing. ?She loves to cook, and denies leaving the stove or the faucet on.  She ambulates without difficulty without the use of a walker or a cane, but she does admit that is hard at times because of the constant arthritic pain.  She suffered a fall in May 31 when the train she was in, coming from [place] to [place] made a shortstop, and she hurt her knee, but did not lose consciousness, or hit her head.  She never had a concussion or seizures. ?She continues to drive, denies getting lost, unless she takes an unfamiliar road where she needs the GPS. Denies headaches, double vision, dizziness, focal numbness or tingling, unilateral weakness or tremors, denies constipation or diarrhea.  She has anosmia for the last 2 years, denies having had COVID.  She denies a history of sleep apnea, alcohol, or tobacco.  She denies family history of dementia. ? ?Neurocognitive evaluation 01/25/2021 [name] "results suggested severe impairment surrounding all aspects of learning and memory. Additional impairments were exhibited across confrontation naming and several aspects of executive functioning (namely cognitive flexibility and response inhibition). A relative weakness was further exhibited across semantic fluency and receptive language. Regarding etiology, I have concerns surrounding the presence of Alzheimer's disease. Across memory tasks, [name] had significant trouble learning new information and did not benefit from repeated exposure. After a delay, she was essentially amnestic across all  assessments. Performance across yes/no recognition tasks was mildly variable, but overall below expectation as she benefited from correct guessing at times. Overall, memory performance suggests a strong storage deficit, which is the hallmark characteristic of this disease process. Additionally, impairments in confrontation naming and a noted discrepancy between semantic and phonemic fluency performances (with the former being consistently worse) is also very consistent with expected patterns of dysfunction with this disease process." ?  ?  ?  ?Labs 09/19/20 ?BMP normal ?CBC normal ?TSH normal 1.25 ?Iron 86, normal ?Ferritin 31.1 on the lower normal ?B1: 12 on the lower normal ?Magnesium 2.2 normal ?LDH normal 202, reticulocyte's 0.8%, normal, fetal hemoglobin less than 1%, normal, hemoglobin A2 2.6, normal, hemoglobin A, 97.4, normal, haptoglobin normal 116 ? ? MRI brain 02/05/21 No acute intracranial abnormality.2. Moderate chronic small vessel ischemic disease ? ?PREVIOUS MEDICATIONS:  ? ?CURRENT MEDICATIONS:  ?Outpatient Encounter Medications as of 08/17/2021  ?Medication Sig  ? acetaminophen (TYLENOL) 325 MG tablet Take 2 tablets (650 mg total) by mouth every 6 (six) hours as needed for mild pain (or temp > 100).  ? carvedilol (COREG) 3.125  MG tablet Take 1 tablet (3.125 mg total) by mouth 2 (two) times daily with a meal.  ? mirabegron ER (MYRBETRIQ) 25 MG TB24 tablet Take 1 tablet (25 mg total) by mouth daily.  ? Omega-3 Fatty Acids (FISH OIL) 1000 MG CAPS Take by mouth. Reported on 12/05/2015  ? potassium chloride SA (KLOR-CON) 20 MEQ tablet Take 1 tablet (20 mEq total) by mouth daily.  ? pravastatin (PRAVACHOL) 40 MG tablet Take 1 tablet (40 mg total) by mouth daily.  ? spironolactone (ALDACTONE) 25 MG tablet Take 1 tablet (25 mg total) by mouth daily.  ? [DISCONTINUED] donepezil (ARICEPT) 10 MG tablet Take half tablet (5 mg) daily for 2 weeks, then increase to the full tablet at 10 mg daily  ? donepezil (ARICEPT)  10 MG tablet Take one tablet at 10 mg daily  ? ?No facility-administered encounter medications on file as of 08/17/2021.  ? ? ? ? ? ?Objective:  ?  ? ?PHYSICAL EXAMINATION:   ? ?VITALS:   ?Vitals:  ? 03/30/

## 2021-09-19 ENCOUNTER — Ambulatory Visit
Admission: RE | Admit: 2021-09-19 | Discharge: 2021-09-19 | Disposition: A | Discharge status: Home / Self Care | Payer: Medicare PPO | Source: Ambulatory Visit | Attending: Internal Medicine | Admitting: Internal Medicine

## 2021-09-19 DIAGNOSIS — E2839 Other primary ovarian failure: Secondary | ICD-10-CM

## 2021-09-25 ENCOUNTER — Encounter: Payer: Self-pay | Admitting: Internal Medicine

## 2021-09-25 ENCOUNTER — Ambulatory Visit (INDEPENDENT_AMBULATORY_CARE_PROVIDER_SITE_OTHER): Payer: Medicare PPO | Admitting: Internal Medicine

## 2021-09-25 VITALS — BP 146/88 | HR 88 | Temp 98.2°F | Resp 16 | Ht 64.0 in | Wt 138.0 lb

## 2021-09-25 DIAGNOSIS — N3281 Overactive bladder: Secondary | ICD-10-CM | POA: Diagnosis not present

## 2021-09-25 DIAGNOSIS — E876 Hypokalemia: Secondary | ICD-10-CM

## 2021-09-25 DIAGNOSIS — L2084 Intrinsic (allergic) eczema: Secondary | ICD-10-CM

## 2021-09-25 DIAGNOSIS — E785 Hyperlipidemia, unspecified: Secondary | ICD-10-CM | POA: Diagnosis not present

## 2021-09-25 DIAGNOSIS — M159 Polyosteoarthritis, unspecified: Secondary | ICD-10-CM

## 2021-09-25 DIAGNOSIS — I10 Essential (primary) hypertension: Secondary | ICD-10-CM

## 2021-09-25 HISTORY — DX: Intrinsic (allergic) eczema: L20.84

## 2021-09-25 MED ORDER — LEVOCETIRIZINE DIHYDROCHLORIDE 5 MG PO TABS: 5.0000 mg | ORAL_TABLET | Freq: Every evening | ORAL | 1 refills | Status: AC

## 2021-09-25 MED ORDER — CELECOXIB 50 MG PO CAPS: 50.0000 mg | ORAL_CAPSULE | Freq: Two times a day (BID) | ORAL | 1 refills | Status: DC

## 2021-09-25 MED ORDER — TRIAMCINOLONE ACETONIDE 0.5 % EX CREA: 1.0000 "application " | TOPICAL_CREAM | Freq: Two times a day (BID) | CUTANEOUS | 2 refills | Status: AC

## 2021-09-25 MED ORDER — CARVEDILOL 3.125 MG PO TABS: 3.1250 mg | ORAL_TABLET | Freq: Two times a day (BID) | ORAL | 1 refills | Status: DC

## 2021-09-25 MED ORDER — MIRABEGRON ER 25 MG PO TB24: 25.0000 mg | ORAL_TABLET | Freq: Every day | ORAL | 1 refills | Status: DC

## 2021-09-25 MED ORDER — SPIRONOLACTONE 25 MG PO TABS: 25.0000 mg | ORAL_TABLET | Freq: Every day | ORAL | 1 refills | Status: DC

## 2021-09-25 NOTE — Patient Instructions (Signed)
Atopic Dermatitis ?Atopic dermatitis is a skin disorder that causes inflammation of the skin. It is marked by a red rash and itchy, dry, scaly skin. It is the most common type of eczema. Eczema is a group of skin conditions that cause the skin to become rough and swollen. This condition is generally worse during the cooler winter months and often improves during the warm summer months. ?Atopic dermatitis usually starts showing signs in infancy and can last through adulthood. This condition cannot be passed from one person to another (is not contagious). Atopic dermatitis may not always be present, but when it is, it is called a flare-up. ?What are the causes? ?The exact cause of this condition is not known. Flare-ups may be triggered by: ?Coming in contact with something that you are sensitive or allergic to (allergen). ?Stress. ?Certain foods. ?Extremely hot or cold weather. ?Harsh chemicals and soaps. ?Dry air. ?Chlorine. ?What increases the risk? ?This condition is more likely to develop in people who have a personal or family history of: ?Eczema. ?Allergies. ?Asthma. ?Hay fever. ?What are the signs or symptoms? ?Symptoms of this condition include: ?Dry, scaly skin. ?Red, itchy rash. ?Itchiness, which can be severe. This may occur before the skin rash. This can make sleeping difficult. ?Skin thickening and cracking that can occur over time. ?How is this diagnosed? ?This condition is diagnosed based on: ?Your symptoms. ?Your medical history. ?A physical exam. ?How is this treated? ?There is no cure for this condition, but symptoms can usually be controlled. Treatment focuses on: ?Controlling the itchiness and scratching. You may be given medicines, such as antihistamines or steroid creams. ?Limiting exposure to allergens. ?Recognizing situations that cause stress and developing a plan to manage stress. ?If your atopic dermatitis does not get better with medicines, or if it is all over your body (widespread), a  treatment using a specific type of light (phototherapy) may be used. ?Follow these instructions at home: ?Skin care ? ?Keep your skin well moisturized. Doing this seals in moisture and helps to prevent dryness. ?Use unscented lotions that have petroleum in them. ?Avoid lotions that contain alcohol or water. They can dry the skin. ?Keep baths or showers short (less than 5 minutes) in warm water. Do not use hot water. ?Use mild, unscented cleansers for bathing. Avoid soap and bubble bath. ?Apply a moisturizer to your skin right after a bath or shower. ?Do not apply anything to your skin without checking with your health care provider. ?General instructions ?Take or apply over-the-counter and prescription medicines only as told by your health care provider. ?Dress in clothes made of cotton or cotton blends. Dress lightly because heat increases itchiness. ?When washing your clothes, rinse your clothes twice so all of the soap is removed. ?Avoid any triggers that can cause a flare-up. ?Keep your fingernails cut short. ?Avoid scratching. Scratching makes the rash and itchiness worse. A break in the skin from scratching could result in a skin infection (impetigo). ?Do not be around people who have cold sores or fever blisters. If you get the infection, it may cause your atopic dermatitis to worsen. ?Keep all follow-up visits. This is important. ?Contact a health care provider if: ?Your itchiness interferes with sleep. ?Your rash gets worse or is not better within one week of starting treatment. ?You have a fever. ?You have a rash flare-up after having contact with someone who has cold sores or fever blisters. ?Get help right away if: ?You develop pus or soft yellow scabs in the rash   area. ?Summary ?Atopic dermatitis causes a red rash and itchy, dry, scaly skin. ?Treatment focuses on controlling the itchiness and scratching, limiting exposure to things that you are sensitive or allergic to (allergens), recognizing  situations that cause stress, and developing a plan to manage stress. ?Keep your skin well moisturized. ?Keep baths or showers shorter than 5 minutes and use warm water. Do not use hot water. ?This information is not intended to replace advice given to you by your health care provider. Make sure you discuss any questions you have with your health care provider. ?Document Revised: 02/15/2020 Document Reviewed: 02/15/2020 ?Elsevier Patient Education ? 2023 [employer] ? ?

## 2021-09-25 NOTE — Progress Notes (Signed)
? ?Subjective:  ?Patient ID: [name], female    DOB: 01/19/1946  Age: 76 y.o. MRN: [id number] ? ?CC: Annual Exam, Rash, Osteoarthritis, Hypertension, and Hyperlipidemia ? ? ?HPI ?[name] presents for a CPX and f/up -  ? ?She complains of a several week history of itchy rash on both upper arms. She has been treating it with topical alcohol.  She walks every day and does not experience chest pain, shortness of breath, diaphoresis, edema, dizziness, or lightheadedness. ? ? ? ? ? ? ?Outpatient Medications Prior to Visit  ?Medication Sig Dispense Refill  ? acetaminophen (TYLENOL) 325 MG tablet Take 2 tablets (650 mg total) by mouth every 6 (six) hours as needed for mild pain (or temp > 100).    ? donepezil (ARICEPT) 10 MG tablet Take one tablet at 10 mg daily 30 tablet 11  ? Omega-3 Fatty Acids (FISH OIL) 1000 MG CAPS Take by mouth. Reported on 12/05/2015    ? potassium chloride SA (KLOR-CON) 20 MEQ tablet Take 1 tablet (20 mEq total) by mouth daily. 90 tablet 0  ? pravastatin (PRAVACHOL) 40 MG tablet Take 1 tablet (40 mg total) by mouth daily. 90 tablet 1  ? carvedilol (COREG) 3.125 MG tablet Take 1 tablet (3.125 mg total) by mouth 2 (two) times daily with a meal. 180 tablet 0  ? mirabegron ER (MYRBETRIQ) 25 MG TB24 tablet Take 1 tablet (25 mg total) by mouth daily. 90 tablet 0  ? spironolactone (ALDACTONE) 25 MG tablet Take 1 tablet (25 mg total) by mouth daily. 90 tablet 1  ? ?No facility-administered medications prior to visit.  ? ? ?ROS ?Review of Systems  ?Constitutional:  Positive for unexpected weight change (wt loss). Negative for chills, diaphoresis, fatigue and fever.  ?HENT: Negative.    ?Eyes: Negative.   ?Respiratory:  Negative for cough, chest tightness, shortness of breath and wheezing.   ?Cardiovascular:  Negative for chest pain, palpitations and leg swelling.  ?Gastrointestinal:  Negative for abdominal pain, constipation, diarrhea, nausea and vomiting.  ?Endocrine: Negative.    ?Genitourinary: Negative.  Negative for difficulty urinating.  ?Musculoskeletal:  Positive for arthralgias. Negative for myalgias and neck pain.  ?Skin:  Positive for rash. Negative for color change.  ?Neurological:  Positive for tremors. Negative for dizziness, seizures, syncope, speech difficulty, weakness, numbness and headaches.  ?Hematological:  Negative for adenopathy. Does not bruise/bleed easily.  ?Psychiatric/Behavioral:  Positive for confusion and decreased concentration. Negative for sleep disturbance. The patient is not nervous/anxious.   ? ?Objective:  ?BP (!) 146/88 (BP Location: Right Arm, Patient Position: Sitting, Cuff Size: Large)   Pulse 88   Temp 98.2 ?F (36.8 ?C) (Oral)   Resp 16   Ht '5\' 4"'$  (1.626 m)   Wt 138 lb (62.6 kg)   SpO2 99%   BMI 23.69 kg/m?  ? ?BP Readings from Last 3 Encounters:  ?09/25/21 (!) 146/88  ?08/17/21 (!) 146/99  ?03/27/21 140/84  ? ? ?Wt Readings from Last 3 Encounters:  ?09/25/21 138 lb (62.6 kg)  ?08/17/21 144 lb (65.3 kg)  ?03/27/21 138 lb (62.6 kg)  ? ? ?Physical Exam ?Vitals reviewed.  ?HENT:  ?   Nose: Nose normal.  ?   Mouth/Throat:  ?   Mouth: Mucous membranes are moist.  ?Eyes:  ?   General: No scleral icterus. ?   Conjunctiva/sclera: Conjunctivae normal.  ?Cardiovascular:  ?   Rate and Rhythm: Normal rate and regular rhythm.  ?   Heart sounds: No murmur  heard. ?Pulmonary:  ?   Effort: Pulmonary effort is normal.  ?   Breath sounds: No stridor. No wheezing, rhonchi or rales.  ?Abdominal:  ?   General: Abdomen is flat.  ?   Palpations: There is no mass.  ?   Tenderness: There is no abdominal tenderness. There is no guarding.  ?   Hernia: No hernia is present.  ?Musculoskeletal:     ?   General: Normal range of motion.  ?   Cervical back: Neck supple.  ?   Right lower leg: No edema.  ?   Left lower leg: No edema.  ?Lymphadenopathy:  ?   Cervical: No cervical adenopathy.  ?Skin: ?   General: Skin is warm and dry.  ?   Findings: Erythema and rash present. No  lesion. Rash is papular. Rash is not macular, purpuric, pustular, scaling, urticarial or vesicular.  ?   Comments: On both upper arms there are areas of erythematous papules.  There are a few excoriations.  ?Neurological:  ?   General: No focal deficit present.  ?   Mental Status: She is alert. Mental status is at baseline.  ?Psychiatric:     ?   Mood and Affect: Mood normal.     ?   Behavior: Behavior normal.  ? ? ?Lab Results  ?Component Value Date  ? WBC 4.0 09/19/2020  ? HGB 12.0 09/19/2020  ? HGB 12.3 09/19/2020  ? HCT 36.8 09/19/2020  ? HCT 36.5 09/19/2020  ? PLT 183.0 09/19/2020  ? GLUCOSE 86 03/27/2021  ? CHOL 179 06/29/2020  ? TRIG 96.0 06/29/2020  ? HDL 53.60 06/29/2020  ? LDLCALC 106 (H) 06/29/2020  ? ALT 18 06/29/2020  ? AST 27 06/29/2020  ? NA 142 03/27/2021  ? K 3.7 03/27/2021  ? CL 106 03/27/2021  ? CREATININE 0.77 03/27/2021  ? BUN 20 03/27/2021  ? CO2 32 03/27/2021  ? TSH 1.25 09/19/2020  ? INR 1.6 (H) 11/01/2006  ? HGBA1C 5.9 08/13/2019  ? ? ?DG Bone Density ? ?Result Date: 09/19/2021 ?EXAM: DUAL X-RAY ABSORPTIOMETRY (DXA) FOR BONE MINERAL DENSITY IMPRESSION: Referring Physician:  [name] Your patient completed a bone mineral density test using GE Lunar iDXA system (analysis version: 16). Technologist: KAT PATIENT: Name: Sergio, Hobart Patient ID: [id number] Birth Date: [birth year] Height: 62.0 in. Sex: Female Measured: 09/19/2021 Weight: 141.2 lbs. Indications: Advanced Age, Aricept, Estrogen Deficient, Height Loss (781.91), Postmenopausal, Secondary Osteoporosis Fractures: NONE Treatments: Calcium (E943.0) ASSESSMENT: The BMD measured at Femur Total Left is 0.810 g/cm2 with a T-score of -1.6. This patient is considered osteopenic/low bone mass according to [facility]) criteria. The quality of the exam is good. L3, L4 were excluded due to degenerative changes. Site Region Measured Date Measured Age YA BMD Significant CHANGE T-score DualFemur Total Left 09/19/2021     75.4         -1.6    0.810 g/cm2 AP Spine  L1-L2      09/19/2021    75.4         1.0     1.280 g/cm2 DualFemur Total Mean 09/19/2021    75.4         -1.3    0.838 g/cm2 World Health Organization [facility]) criteria for post-menopausal, Caucasian Women: Normal       T-score at or above -1 SD Osteopenia   T-score between -1 and -2.5 SD Osteoporosis T-score at or below -2.5 SD RECOMMENDATION: 1. All patients should optimize calcium  and vitamin D intake. 2. Consider FDA-approved medical therapies in postmenopausal women and men aged 29 years and older, based on the following: a. A hip or vertebral (clinical or morphometric) fracture. b. T-score = -2.5 at the femoral neck or spine after appropriate evaluation to exclude secondary causes. c. Low bone mass (T-score between -1.0 and -2.5 at the femoral neck or spine) and a 10-year probability of a hip fracture = 3% or a 10-year probability of a major osteoporosis-related fracture = 20% based on the [place]-adapted WHO algorithm. d. Clinician judgment and/or patient preferences may indicate treatment for people with 10-year fracture probabilities above or below these levels. FOLLOW-UP: Patients with diagnosis of osteoporosis or at high risk for fracture should have regular bone mineral density tests.? Patients eligible for Medicare are allowed routine testing every 2 years.? The testing frequency can be increased to one year for patients who have rapidly progressing disease, are receiving or discontinuing medical therapy to restore bone mass, or have additional risk factors. I have reviewed this study and agree with the findings. [facility] FRAX* 10-year Probability of Fracture Based on femoral neck BMD: DualFemur (Left) Major Osteoporotic Fracture: 4.7% Hip Fracture:                0.8% Population:                  [place] (Black) Risk Factors:                Secondary Osteoporosis *FRAX is a [employer] for Metabolic  Bone Disease, a [place] (WHO) [facility]. ASSESSMENT: The probability of a major osteoporotic fracture is 4.7 % within the next ten years. The probability of a hip fracture is 0.8 % wi

## 2021-11-17 ENCOUNTER — Ambulatory Visit (INDEPENDENT_AMBULATORY_CARE_PROVIDER_SITE_OTHER): Payer: Medicare PPO | Admitting: Family Medicine

## 2021-11-17 ENCOUNTER — Encounter: Payer: Self-pay | Admitting: Family Medicine

## 2021-11-17 VITALS — BP 140/82 | HR 65 | Temp 97.6°F | Ht 64.0 in | Wt 139.0 lb

## 2021-11-17 DIAGNOSIS — M25552 Pain in left hip: Secondary | ICD-10-CM | POA: Diagnosis not present

## 2021-11-17 DIAGNOSIS — R531 Weakness: Secondary | ICD-10-CM | POA: Insufficient documentation

## 2021-11-17 DIAGNOSIS — R197 Diarrhea, unspecified: Secondary | ICD-10-CM | POA: Diagnosis not present

## 2021-11-17 MED ORDER — CELECOXIB 50 MG PO CAPS: 50.0000 mg | ORAL_CAPSULE | Freq: Two times a day (BID) | ORAL | 1 refills | Status: DC

## 2021-11-17 NOTE — Assessment & Plan Note (Signed)
May be multifactorial. Increase hydration.

## 2021-11-17 NOTE — Progress Notes (Signed)
Subjective:     Patient ID: [name], female    DOB: November 16, 1945, 76 y.o.   MRN: [id number]  Chief Complaint  Patient presents with   Hip Pain    Left hip pain on and off for a little over a year    Diarrhea    Overactive bowels starting today    HPI Patient is in today for left sided hip pain x more than one year.  The pain is becoming more frequent.  Pain is lateral and non radiating.  Pain is worse with walking. Pain is not keeping her awake at night.   Tylenol  Using Voltaren topical gel.     Diarrhea with certain foods. She had several episodes yesterday. States she feels fine today.  States she has generalized weakness. This is not new.   Denies fever, chills, dizziness, chest pain, palpitations, shortness of breath, abdominal pain, N/V, urinary symptoms, LE edema.   Her daughter states they are all going to [place] for 2 months.    Health Maintenance Due  Topic Date Due   Zoster Vaccines- Shingrix (2 of 2) 09/04/2017   COVID-19 Vaccine (5 - Pfizer series) 11/02/2020   Pneumonia Vaccine 63+ Years old (2 - PCV) 09/19/2021    Past Medical History:  Diagnosis Date   Chronic bilateral low back pain without sciatica 12/26/2020   Deficiency anemia 06/29/2020   Essential hypertension, benign 07/11/2009   Hyperlipidemia    Hyperlipidemia with target LDL less than 130 06/29/2020   The 10-year ASCVD risk score Mikey Bussing DC Jr., et al., 2013) is: 16.3%   Values used to calculate the score:     Age: 19 years     Sex: Female     Is Non-Hispanic African American: Yes     Diabetic: No     Tobacco smoker: No     Systolic Blood Pressure: 323 mmHg     Is BP treated: Yes     HDL Cholesterol: 53.6 mg/dL     Total Cholesterol: 179 mg/dL   Hypokalemia 06/29/2020   LAE (left atrial enlargement) 06/29/2020   Mild neurocognitive disorder due to Alzheimer's disease, possible 12/26/2020   OAB (overactive bladder) 12/26/2020   Polyp of colon 09/19/2020   Primary  osteoarthritis of left shoulder 01/17/2021   Primary osteoarthritis of right shoulder 01/17/2021    Past Surgical History:  Procedure Laterality Date   LAPAROSCOPIC LYSIS OF ADHESIONS  09/08/2017   LAPAROSCOPY N/A 09/08/2017   Procedure: LYSIS OF ADHESIONS LAPAROSCOPIC;  Surgeon: Kieth Brightly Arta Bruce, MD;  Location: [facility];  Service: General;  Laterality: N/A;   REPLACEMENT TOTAL KNEE Left     Family History  Problem Relation Age of Onset   Cancer Mother        STOMACH   Heart disease Father    Diabetes Sister    Diabetes Brother    Breast cancer Neg Hx     Social History   Socioeconomic History   Marital status: Divorced    Spouse name: Not on file   Number of children: Not on file   Years of education: 16   Highest education level: Bachelor's degree (e.g., BA, AB, BS)  Occupational History   Occupation: Retired    Comment: Designer, multimedia  Tobacco Use   Smoking status: Never   Smokeless tobacco: Never  Vaping Use   Vaping Use: Never used  Substance and Sexual Activity   Alcohol use: No    Alcohol/week: 0.0 standard drinks of alcohol  Comment: occasional   Drug use: No   Sexual activity: Not Currently    Birth control/protection: None  Other Topics Concern   Not on file  Social History Narrative   Right handed    Lives alone    2 story home   Drinks caffeine prn   Social Determinants of Health   Financial Resource Strain: Low Risk  (12/26/2020)   Overall Financial Resource Strain (CARDIA)    Difficulty of Paying Living Expenses: Not hard at all  Food Insecurity: No Food Insecurity (12/26/2020)   Hunger Vital Sign    Worried About Running Out of Food in the Last Year: Never true    Ran Out of Food in the Last Year: Never true  Transportation Needs: No Transportation Needs (12/26/2020)   PRAPARE - Hydrologist (Medical): No    Lack of Transportation (Non-Medical): No  Physical Activity: Sufficiently Active (12/26/2020)   Exercise  Vital Sign    Days of Exercise per Week: 5 days    Minutes of Exercise per Session: 30 min  Stress: No Stress Concern Present (12/26/2020)   [place]    Feeling of Stress : Not at all  Social Connections: Moderately Integrated (12/26/2020)   Social Connection and Isolation Panel [NHANES]    Frequency of Communication with Friends and Family: More than three times a week    Frequency of Social Gatherings with Friends and Family: More than three times a week    Attends Religious Services: More than 4 times per year    Active Member of [employer] or Organizations: Yes    Attends Archivist Meetings: More than 4 times per year    Marital Status: Widowed  Intimate Partner Violence: Not At Risk (12/26/2020)   Humiliation, Afraid, Rape, and Kick questionnaire    Fear of Current or Ex-Partner: No    Emotionally Abused: No    Physically Abused: No    Sexually Abused: No    Outpatient Medications Prior to Visit  Medication Sig Dispense Refill   acetaminophen (TYLENOL) 325 MG tablet Take 2 tablets (650 mg total) by mouth every 6 (six) hours as needed for mild pain (or temp > 100).     carvedilol (COREG) 3.125 MG tablet Take 1 tablet (3.125 mg total) by mouth 2 (two) times daily with a meal. 180 tablet 1   donepezil (ARICEPT) 10 MG tablet Take one tablet at 10 mg daily 30 tablet 11   levocetirizine (XYZAL) 5 MG tablet Take 1 tablet (5 mg total) by mouth every evening. 90 tablet 1   mirabegron ER (MYRBETRIQ) 25 MG TB24 tablet Take 1 tablet (25 mg total) by mouth daily. 90 tablet 1   Omega-3 Fatty Acids (FISH OIL) 1000 MG CAPS Take by mouth. Reported on 12/05/2015     potassium chloride SA (KLOR-CON) 20 MEQ tablet Take 1 tablet (20 mEq total) by mouth daily. 90 tablet 0   pravastatin (PRAVACHOL) 40 MG tablet Take 1 tablet (40 mg total) by mouth daily. 90 tablet 1   spironolactone (ALDACTONE) 25 MG tablet Take 1 tablet (25 mg total)  by mouth daily. 90 tablet 1   triamcinolone cream (KENALOG) 0.5 % Apply 1 application. topically 2 (two) times daily. 60 g 2   celecoxib (CELEBREX) 50 MG capsule Take 1 capsule (50 mg total) by mouth 2 (two) times daily. 180 capsule 1   No facility-administered medications prior to visit.  No Known Allergies  ROS Pertinent positives and negatives in the history of present illness.     Objective:    Physical Exam Constitutional:      General: She is not in acute distress.    Appearance: She is not ill-appearing.  Cardiovascular:     Rate and Rhythm: Normal rate.  Pulmonary:     Effort: Pulmonary effort is normal.     Breath sounds: Normal breath sounds.  Abdominal:     General: Bowel sounds are increased. There is no distension.     Palpations: Abdomen is soft.     Tenderness: There is no abdominal tenderness. There is no guarding or rebound.  Musculoskeletal:     Left hip: Normal range of motion. Normal strength.     Comments: Bilateral LEs are neurovascularly intact  Neurological:     Mental Status: She is alert. Mental status is at baseline.     Sensory: No sensory deficit.     Motor: No weakness.     Gait: Gait normal.     BP 140/82 (BP Location: Left Arm, Patient Position: Sitting, Cuff Size: Large)   Pulse 65   Temp 97.6 F (36.4 C) (Temporal)   Ht '5\' 4"'$  (1.626 m)   Wt 139 lb (63 kg)   SpO2 99%   BMI 23.86 kg/m  Wt Readings from Last 3 Encounters:  11/17/21 139 lb (63 kg)  09/25/21 138 lb (62.6 kg)  08/17/21 144 lb (65.3 kg)       Assessment & Plan:   Problem List Items Addressed This Visit       Other   Diarrhea    Resolved. Intermittent with certain foods. May take Imodium if needed. Stay hydrated.       Generalized weakness    May be multifactorial. Increase hydration.       Left hip pain - Primary    Chronic. Discussed using ice or heat, topical analgesic, Tylenol, and refilled Celebrex.  Discussed additional testing such as XR,  referral to sports medicine, ortho but patient is leaving with her daughter in 2 days and will be in [place] for 2 months. Discussed seeing an orthopedist in [place] if not improving.       Relevant Medications   celecoxib (CELEBREX) 50 MG capsule    I am having Shavaun W. Mazariego maintain her Fish Oil, acetaminophen, pravastatin, potassium chloride SA, donepezil, levocetirizine, triamcinolone cream, carvedilol, mirabegron ER, spironolactone, and celecoxib.  Meds ordered this encounter  Medications   celecoxib (CELEBREX) 50 MG capsule    Sig: Take 1 capsule (50 mg total) by mouth 2 (two) times daily.    Dispense:  180 capsule    Refill:  1    Order Specific Question:   Supervising Provider    Answer:   Pricilla Holm A [[id number]]

## 2021-11-17 NOTE — Patient Instructions (Addendum)
You may want to consider seeing an orthopedist or sports medicine while in [place].   Continue taking Tylenol and Celebrex.  Use ice or heat on your hip.  You can continue using the topical pain medicine as well.  If you are having increased bowel movements and as long no fever or blood in the stool then you can take Imodium.  Make sure you are staying well-hydrated or you can have weakness.  You can drink 1/2 pedialyte and 1/2 water to rehydrate.    Diarrhea, Adult Diarrhea is frequent loose and watery bowel movements. Diarrhea can make you feel weak and cause you to become dehydrated. Dehydration can make you tired and thirsty, cause you to have a dry mouth, and decrease how often you urinate. Diarrhea typically lasts 2-3 days. However, it can last longer if it is a sign of something more serious. It is important to treat your diarrhea as told by your health care provider. Follow these instructions at home: Eating and drinking     Follow these recommendations as told by your health care provider: Take an oral rehydration solution (ORS). This is an over-the-counter medicine that helps return your body to its normal balance of nutrients and water. It is found at pharmacies and retail stores. Drink plenty of fluids, such as water, ice chips, diluted fruit juice, and low-calorie sports drinks. You can drink milk also, if desired. Avoid drinking fluids that contain a lot of sugar or caffeine, such as energy drinks, sports drinks, and soda. Eat bland, easy-to-digest foods in small amounts as you are able. These foods include bananas, applesauce, rice, lean meats, toast, and crackers. Avoid alcohol. Avoid spicy or fatty foods.  Medicines Take over-the-counter and prescription medicines only as told by your health care provider. If you were prescribed an antibiotic medicine, take it as told by your health care provider. Do not stop using the antibiotic even if you start to feel  better. General instructions  Wash your hands often using soap and water. If soap and water are not available, use a hand sanitizer. Others in the household should wash their hands as well. Hands should be washed: After using the toilet or changing a diaper. Before preparing, cooking, or serving food. While caring for a sick person or while visiting someone in a hospital. Drink enough fluid to keep your urine pale yellow. Rest at home while you recover. Watch your condition for any changes. Take a warm bath to relieve any burning or pain from frequent diarrhea episodes. Keep all follow-up visits as told by your health care provider. This is important. Contact a health care provider if: You have a fever. Your diarrhea gets worse. You have new symptoms. You cannot keep fluids down. You feel light-headed or dizzy. You have a headache. You have muscle cramps. Get help right away if: You have chest pain. You feel extremely weak or you faint. You have bloody or black stools or stools that look like tar. You have severe pain, cramping, or bloating in your abdomen. You have trouble breathing or you are breathing very quickly. Your heart is beating very quickly. Your skin feels cold and clammy. You feel confused. You have signs of dehydration, such as: Dark urine, very little urine, or no urine. Cracked lips. Dry mouth. Sunken eyes. Sleepiness. Weakness. Summary Diarrhea is frequent loose and sometimes watery bowel movements. Diarrhea can make you feel weak and cause you to become dehydrated. Drink enough fluids to keep your urine pale yellow.  Make sure that you wash your hands after using the toilet. If soap and water are not available, use hand sanitizer. Contact a health care provider if your diarrhea gets worse or you have new symptoms. Get help right away if you have signs of dehydration. This information is not intended to replace advice given to you by your health care  provider. Make sure you discuss any questions you have with your health care provider. Document Revised: 11/16/2020 Document Reviewed: 11/16/2020 Elsevier Patient Education  Day.

## 2021-11-17 NOTE — Assessment & Plan Note (Signed)
Resolved. Intermittent with certain foods. May take Imodium if needed. Stay hydrated.

## 2021-11-17 NOTE — Assessment & Plan Note (Signed)
Chronic. Discussed using ice or heat, topical analgesic, Tylenol, and refilled Celebrex.  Discussed additional testing such as XR, referral to sports medicine, ortho but patient is leaving with her daughter in 2 days and will be in [place] for 2 months. Discussed seeing an orthopedist in [place] if not improving.

## 2021-12-22 DIAGNOSIS — M1612 Unilateral primary osteoarthritis, left hip: Secondary | ICD-10-CM | POA: Diagnosis not present

## 2022-01-09 ENCOUNTER — Ambulatory Visit: Payer: Medicare PPO

## 2022-01-09 ENCOUNTER — Telehealth: Payer: Self-pay | Admitting: Internal Medicine

## 2022-01-09 DIAGNOSIS — M159 Polyosteoarthritis, unspecified: Secondary | ICD-10-CM

## 2022-01-09 DIAGNOSIS — R531 Weakness: Secondary | ICD-10-CM

## 2022-01-09 DIAGNOSIS — G8929 Other chronic pain: Secondary | ICD-10-CM

## 2022-01-09 NOTE — Telephone Encounter (Signed)
N/A- Tried calling pt to inform that her appointmet scheduled today for AWV needed to be rescheduled due to [facility] being out. I have moved appointment to Friday 01/12/22 @ 9:15, if pt calls back and this new appointment time does not work for her please reschedule to a different time with [facility].

## 2022-01-09 NOTE — Telephone Encounter (Signed)
Pt daughter Solmon Ice is requesting a call to discuss a possible rx for a wheelchair for her mother. She said she has an rx for a wheelchair from another provider in [place] but she said she would rather work with her mothers pcp. She said she would like some advice and to discuss this with the nurse or Dr. Ronnald Ramp.  CB: [phone]   Please advise.

## 2022-01-10 NOTE — Telephone Encounter (Signed)
DME has been ordered.   Felicia informed and will call back to provide fax number of the company that it should be sent to.

## 2022-01-12 ENCOUNTER — Telehealth: Payer: Self-pay

## 2022-01-12 ENCOUNTER — Ambulatory Visit: Payer: Medicare PPO

## 2022-01-12 NOTE — Telephone Encounter (Signed)
Patient scheduled for AWV-S for today at 9:15 am. Patient rescheduled AWV for 02/08/2022 at 9:001 am.  Joannie Springs, LPN.

## 2022-01-12 NOTE — Telephone Encounter (Addendum)
The fax number [phone].  Please include height and weight,  Dx code, and last office note stating why she would need it

## 2022-01-15 NOTE — Telephone Encounter (Signed)
DME and last OV note has been faxed

## 2022-02-08 ENCOUNTER — Encounter: Payer: Self-pay | Admitting: Internal Medicine

## 2022-02-08 ENCOUNTER — Ambulatory Visit (INDEPENDENT_AMBULATORY_CARE_PROVIDER_SITE_OTHER): Payer: Medicare PPO | Admitting: Internal Medicine

## 2022-02-08 ENCOUNTER — Ambulatory Visit (INDEPENDENT_AMBULATORY_CARE_PROVIDER_SITE_OTHER): Payer: Medicare PPO

## 2022-02-08 VITALS — BP 138/88 | HR 76 | Temp 98.0°F | Ht 64.0 in | Wt 143.0 lb

## 2022-02-08 DIAGNOSIS — Z23 Encounter for immunization: Secondary | ICD-10-CM

## 2022-02-08 DIAGNOSIS — I10 Essential (primary) hypertension: Secondary | ICD-10-CM | POA: Diagnosis not present

## 2022-02-08 DIAGNOSIS — N3281 Overactive bladder: Secondary | ICD-10-CM

## 2022-02-08 DIAGNOSIS — E785 Hyperlipidemia, unspecified: Secondary | ICD-10-CM | POA: Diagnosis not present

## 2022-02-08 DIAGNOSIS — Z0001 Encounter for general adult medical examination with abnormal findings: Secondary | ICD-10-CM | POA: Insufficient documentation

## 2022-02-08 DIAGNOSIS — Z0000 Encounter for general adult medical examination without abnormal findings: Secondary | ICD-10-CM | POA: Diagnosis not present

## 2022-02-08 LAB — BASIC METABOLIC PANEL
BUN: 16 mg/dL (ref 6–23)
CO2: 33 mEq/L — ABNORMAL HIGH (ref 19–32)
Calcium: 9.3 mg/dL (ref 8.4–10.5)
Chloride: 103 mEq/L (ref 96–112)
Creatinine, Ser: 0.88 mg/dL (ref 0.40–1.20)
GFR: 64.08 mL/min (ref >60.00)
Glucose, Bld: 96 mg/dL (ref 70–99)
Potassium: 3.8 mEq/L (ref 3.5–5.1)
Sodium: 141 mEq/L (ref 135–145)

## 2022-02-08 LAB — HEPATIC FUNCTION PANEL
ALT: 15 U/L (ref 0–35)
AST: 24 U/L (ref 0–37)
Albumin: 3.7 g/dL (ref 3.5–5.2)
Alkaline Phosphatase: 70 U/L (ref 39–117)
Bilirubin, Direct: 0.1 mg/dL (ref 0.0–0.3)
Total Bilirubin: 0.8 mg/dL (ref 0.2–1.2)
Total Protein: 7.2 g/dL (ref 6.0–8.3)

## 2022-02-08 LAB — CBC WITH DIFFERENTIAL/PLATELET
Basophils Absolute: 0 10*3/uL (ref 0.0–0.1)
Basophils Relative: 0.5 % (ref 0.0–3.0)
Eosinophils Absolute: 0.1 10*3/uL (ref 0.0–0.7)
Eosinophils Relative: 2.4 % (ref 0.0–5.0)
HCT: 37.4 % (ref 36.0–46.0)
Hemoglobin: 12.5 g/dL (ref 12.0–15.0)
Lymphocytes Relative: 34.5 % (ref 12.0–46.0)
Lymphs Abs: 1.4 10*3/uL (ref 0.7–4.0)
MCHC: 33.3 g/dL (ref 30.0–36.0)
MCV: 93 fl (ref 78.0–100.0)
Monocytes Absolute: 0.4 10*3/uL (ref 0.1–1.0)
Monocytes Relative: 9.5 % (ref 3.0–12.0)
Neutro Abs: 2.1 10*3/uL (ref 1.4–7.7)
Neutrophils Relative %: 53.1 % (ref 43.0–77.0)
Platelets: 178 10*3/uL (ref 150.0–400.0)
RBC: 4.02 Mil/uL (ref 3.87–5.11)
RDW: 14.4 % (ref 11.5–15.5)
WBC: 4 10*3/uL (ref 4.0–10.5)

## 2022-02-08 LAB — LIPID PANEL
Cholesterol: 233 mg/dL — ABNORMAL HIGH (ref 0–200)
HDL: 57.6 mg/dL (ref >39.00)
LDL Cholesterol: 149 mg/dL — ABNORMAL HIGH (ref 0–99)
NonHDL: 175.24
Total CHOL/HDL Ratio: 4
Triglycerides: 133 mg/dL (ref 0.0–149.0)
VLDL: 26.6 mg/dL (ref 0.0–40.0)

## 2022-02-08 LAB — TSH: TSH: 2.33 u[IU]/mL (ref 0.35–5.50)

## 2022-02-08 MED ORDER — MIRABEGRON ER 50 MG PO TB24: 50.0000 mg | ORAL_TABLET | Freq: Every day | ORAL | 1 refills | Status: DC

## 2022-02-08 NOTE — Patient Instructions (Signed)
[name] , Thank you for taking time to come for your Medicare Wellness Visit. I appreciate your ongoing commitment to your health goals. Please review the following plan we discussed and let me know if I can assist you in the future.   These are the goals we discussed:  Goals       cONTINIE TO WALK 1 MILE PER DAY, WATCH MY EATING BY INCREASING FRUITS AND USING NO SALT.      Patient Stated (pt-stated)      Continue to increase my water intake and watch my diet.        This is a list of the screening recommended for you and due dates:  Health Maintenance  Topic Date Due   Zoster (Shingles) Vaccine (2 of 2) 09/04/2017   Pneumonia Vaccine (2 - PCV) 09/19/2021   Tetanus Vaccine  12/18/2021   Flu Shot  Completed   DEXA scan (bone density measurement)  Completed   Hepatitis C Screening: USPSTF Recommendation to screen - Ages 16-79 yo.  Completed   HPV Vaccine  Aged Out   Colon Cancer Screening  Discontinued   COVID-19 Vaccine  Discontinued    Advanced directives: Yes; Please bring a copy of your health care power of attorney and living will to the office at your convenience.  Conditions/risks identified: Yes  Next appointment: Follow up in one year for your annual wellness visit.   Preventive Care 46 Years and Older, Female Preventive care refers to lifestyle choices and visits with your health care provider that can promote health and wellness. What does preventive care include? A yearly physical exam. This is also called an annual well check. Dental exams once or twice a year. Routine eye exams. Ask your health care provider how often you should have your eyes checked. Personal lifestyle choices, including: Daily care of your teeth and gums. Regular physical activity. Eating a healthy diet. Avoiding tobacco and drug use. Limiting alcohol use. Practicing safe sex. Taking low-dose aspirin every day. Taking vitamin and mineral supplements as recommended by your health  care provider. What happens during an annual well check? The services and screenings done by your health care provider during your annual well check will depend on your age, overall health, lifestyle risk factors, and family history of disease. Counseling  Your health care provider may ask you questions about your: Alcohol use. Tobacco use. Drug use. Emotional well-being. Home and relationship well-being. Sexual activity. Eating habits. History of falls. Memory and ability to understand (cognition). Work and work Statistician. Reproductive health. Screening  You may have the following tests or measurements: Height, weight, and BMI. Blood pressure. Lipid and cholesterol levels. These may be checked every 5 years, or more frequently if you are over 72 years old. Skin check. Lung cancer screening. You may have this screening every year starting at age 29 if you have a 30-pack-year history of smoking and currently smoke or have quit within the past 15 years. Fecal occult blood test (FOBT) of the stool. You may have this test every year starting at age 4. Flexible sigmoidoscopy or colonoscopy. You may have a sigmoidoscopy every 5 years or a colonoscopy every 10 years starting at age 70. Hepatitis C blood test. Hepatitis B blood test. Sexually transmitted disease (STD) testing. Diabetes screening. This is done by checking your blood sugar (glucose) after you have not eaten for a while (fasting). You may have this done every 1-3 years. Bone density scan. This is done to screen for  osteoporosis. You may have this done starting at age 81. Mammogram. This may be done every 1-2 years. Talk to your health care provider about how often you should have regular mammograms. Talk with your health care provider about your test results, treatment options, and if necessary, the need for more tests. Vaccines  Your health care provider may recommend certain vaccines, such as: Influenza vaccine. This is  recommended every year. Tetanus, diphtheria, and acellular pertussis (Tdap, Td) vaccine. You may need a Td booster every 10 years. Zoster vaccine. You may need this after age 49. Pneumococcal 13-valent conjugate (PCV13) vaccine. One dose is recommended after age 19. Pneumococcal polysaccharide (PPSV23) vaccine. One dose is recommended after age 23. Talk to your health care provider about which screenings and vaccines you need and how often you need them. This information is not intended to replace advice given to you by your health care provider. Make sure you discuss any questions you have with your health care provider. Document Released: 06/03/2015 Document Revised: 01/25/2016 Document Reviewed: 03/08/2015 Elsevier Interactive Patient Education  2017 [place] Prevention in the Home Falls can cause injuries. They can happen to people of all ages. There are many things you can do to make your home safe and to help prevent falls. What can I do on the outside of my home? Regularly fix the edges of walkways and driveways and fix any cracks. Remove anything that might make you trip as you walk through a door, such as a raised step or threshold. Trim any bushes or trees on the path to your home. Use bright outdoor lighting. Clear any walking paths of anything that might make someone trip, such as rocks or tools. Regularly check to see if handrails are loose or broken. Make sure that both sides of any steps have handrails. Any raised decks and porches should have guardrails on the edges. Have any leaves, snow, or ice cleared regularly. Use sand or salt on walking paths during winter. Clean up any spills in your garage right away. This includes oil or grease spills. What can I do in the bathroom? Use night lights. Install grab bars by the toilet and in the tub and shower. Do not use towel bars as grab bars. Use non-skid mats or decals in the tub or shower. If you need to sit down in  the shower, use a plastic, non-slip stool. Keep the floor dry. Clean up any water that spills on the floor as soon as it happens. Remove soap buildup in the tub or shower regularly. Attach bath mats securely with double-sided non-slip rug tape. Do not have throw rugs and other things on the floor that can make you trip. What can I do in the bedroom? Use night lights. Make sure that you have a light by your bed that is easy to reach. Do not use any sheets or blankets that are too big for your bed. They should not hang down onto the floor. Have a firm chair that has side arms. You can use this for support while you get dressed. Do not have throw rugs and other things on the floor that can make you trip. What can I do in the kitchen? Clean up any spills right away. Avoid walking on wet floors. Keep items that you use a lot in easy-to-reach places. If you need to reach something above you, use a strong step stool that has a grab bar. Keep electrical cords out of the way. Do not  use floor polish or wax that makes floors slippery. If you must use wax, use non-skid floor wax. Do not have throw rugs and other things on the floor that can make you trip. What can I do with my stairs? Do not leave any items on the stairs. Make sure that there are handrails on both sides of the stairs and use them. Fix handrails that are broken or loose. Make sure that handrails are as long as the stairways. Check any carpeting to make sure that it is firmly attached to the stairs. Fix any carpet that is loose or worn. Avoid having throw rugs at the top or bottom of the stairs. If you do have throw rugs, attach them to the floor with carpet tape. Make sure that you have a light switch at the top of the stairs and the bottom of the stairs. If you do not have them, ask someone to add them for you. What else can I do to help prevent falls? Wear shoes that: Do not have high heels. Have rubber bottoms. Are comfortable  and fit you well. Are closed at the toe. Do not wear sandals. If you use a stepladder: Make sure that it is fully opened. Do not climb a closed stepladder. Make sure that both sides of the stepladder are locked into place. Ask someone to hold it for you, if possible. Clearly mark and make sure that you can see: Any grab bars or handrails. First and last steps. Where the edge of each step is. Use tools that help you move around (mobility aids) if they are needed. These include: Canes. Walkers. Scooters. Crutches. Turn on the lights when you go into a dark area. Replace any light bulbs as soon as they burn out. Set up your furniture so you have a clear path. Avoid moving your furniture around. If any of your floors are uneven, fix them. If there are any pets around you, be aware of where they are. Review your medicines with your doctor. Some medicines can make you feel dizzy. This can increase your chance of falling. Ask your doctor what other things that you can do to help prevent falls. This information is not intended to replace advice given to you by your health care provider. Make sure you discuss any questions you have with your health care provider. Document Released: 03/03/2009 Document Revised: 10/13/2015 Document Reviewed: 06/11/2014 Elsevier Interactive Patient Education  2017 [employer].

## 2022-02-08 NOTE — Progress Notes (Signed)
Subjective:  Patient ID: [name], female    DOB: Nov 17, 1945  Age: 76 y.o. MRN: [id number]  CC: Hypertension, Osteoarthritis, and Hyperlipidemia   HPI [name] presents for f/up -   She needs a preop clearance.  She is having orthopedic surgery in [place] where her daughter lives.  She points to the hip but also says she has some discomfort in her back.  She cannot clarify what kind of surgery she will be having and the preop forms do not specify but I assume she will have total hip replacement to treat chronic pain after a fall.  She denies chest pain, shortness of breath, diaphoresis, or edema.  Outpatient Medications Prior to Visit  Medication Sig Dispense Refill   acetaminophen (TYLENOL) 325 MG tablet Take 2 tablets (650 mg total) by mouth every 6 (six) hours as needed for mild pain (or temp > 100).     carvedilol (COREG) 3.125 MG tablet Take 1 tablet (3.125 mg total) by mouth 2 (two) times daily with a meal. 180 tablet 1   celecoxib (CELEBREX) 50 MG capsule Take 1 capsule (50 mg total) by mouth 2 (two) times daily. 180 capsule 1   donepezil (ARICEPT) 10 MG tablet Take one tablet at 10 mg daily 30 tablet 11   levocetirizine (XYZAL) 5 MG tablet Take 1 tablet (5 mg total) by mouth every evening. 90 tablet 1   Omega-3 Fatty Acids (FISH OIL) 1000 MG CAPS Take by mouth. Reported on 12/05/2015     potassium chloride SA (KLOR-CON) 20 MEQ tablet Take 1 tablet (20 mEq total) by mouth daily. 90 tablet 0   pravastatin (PRAVACHOL) 40 MG tablet Take 1 tablet (40 mg total) by mouth daily. 90 tablet 1   spironolactone (ALDACTONE) 25 MG tablet Take 1 tablet (25 mg total) by mouth daily. 90 tablet 1   triamcinolone cream (KENALOG) 0.5 % Apply 1 application. topically 2 (two) times daily. 60 g 2   mirabegron ER (MYRBETRIQ) 25 MG TB24 tablet Take 1 tablet (25 mg total) by mouth daily. 90 tablet 1   No facility-administered medications prior to visit.    ROS Review of  Systems  Constitutional: Negative.   HENT: Negative.    Eyes: Negative.   Respiratory:  Negative for cough, chest tightness and wheezing.   Cardiovascular:  Negative for chest pain, palpitations and leg swelling.  Gastrointestinal:  Negative for abdominal pain, diarrhea and nausea.  Endocrine: Negative.   Genitourinary:  Positive for frequency.  Musculoskeletal:  Positive for arthralgias and back pain.  Skin: Negative.  Negative for color change.  Neurological: Negative.  Negative for dizziness and weakness.  Hematological:  Negative for adenopathy. Does not bruise/bleed easily.  Psychiatric/Behavioral: Negative.      Objective:  BP 138/88 (BP Location: Right Arm, Patient Position: Sitting, Cuff Size: Normal)   Pulse 76   Temp 98 F (36.7 C) (Oral)   Ht '5\' 4"'$  (1.626 m)   Wt 143 lb (64.9 kg)   SpO2 100%   BMI 24.55 kg/m   BP Readings from Last 3 Encounters:  02/08/22 138/88  02/08/22 138/88  11/17/21 140/82    Wt Readings from Last 3 Encounters:  02/08/22 143 lb (64.9 kg)  02/08/22 143 lb (64.9 kg)  11/17/21 139 lb (63 kg)    Physical Exam Vitals reviewed.  HENT:     Mouth/Throat:     Mouth: Mucous membranes are moist.  Eyes:     General: No scleral icterus.  Conjunctiva/sclera: Conjunctivae normal.  Cardiovascular:     Rate and Rhythm: Normal rate and regular rhythm.     Heart sounds: No murmur heard.    Comments: EKG- NSR, 61 bpm LAE No LVH, Q waves, ST/T wave changes Pulmonary:     Effort: Pulmonary effort is normal.     Breath sounds: No stridor. No wheezing, rhonchi or rales.  Abdominal:     General: Abdomen is flat.     Palpations: There is no mass.     Tenderness: There is no abdominal tenderness. There is no guarding.     Hernia: No hernia is present.  Musculoskeletal:        General: Normal range of motion.     Cervical back: Neck supple.     Right lower leg: No edema.     Left lower leg: No edema.  Skin:    General: Skin is warm and dry.   Neurological:     General: No focal deficit present.     Mental Status: She is alert.     Lab Results  Component Value Date   WBC 4.0 09/19/2020   HGB 12.0 09/19/2020   HGB 12.3 09/19/2020   HCT 36.8 09/19/2020   HCT 36.5 09/19/2020   PLT 183.0 09/19/2020   GLUCOSE 86 03/27/2021   CHOL 179 06/29/2020   TRIG 96.0 06/29/2020   HDL 53.60 06/29/2020   LDLCALC 106 (H) 06/29/2020   ALT 18 06/29/2020   AST 27 06/29/2020   NA 142 03/27/2021   K 3.7 03/27/2021   CL 106 03/27/2021   CREATININE 0.77 03/27/2021   BUN 20 03/27/2021   CO2 32 03/27/2021   TSH 1.25 09/19/2020   INR 1.6 (H) 11/01/2006   HGBA1C 5.9 08/13/2019    DG Bone Density  Result Date: 09/19/2021 EXAM: DUAL X-RAY ABSORPTIOMETRY (DXA) FOR BONE MINERAL DENSITY IMPRESSION: Referring Physician:  [name] Your patient completed a bone mineral density test using GE Lunar iDXA system (analysis version: 16). Technologist: KAT PATIENT: Name: [name], [name] Patient ID: [id number] Birth Date: Feb 24, 1946 Height: 62.0 in. Sex: Female Measured: 09/19/2021 Weight: 141.2 lbs. Indications: Advanced Age, Aricept, Estrogen Deficient, Height Loss (781.91), Postmenopausal, Secondary Osteoporosis Fractures: NONE Treatments: Calcium (E943.0) ASSESSMENT: The BMD measured at Femur Total Left is 0.810 g/cm2 with a T-score of -1.6. This patient is considered osteopenic/low bone mass according to [facility]) criteria. The quality of the exam is good. L3, L4 were excluded due to degenerative changes. Site Region Measured Date Measured Age YA BMD Significant CHANGE T-score DualFemur Total Left 09/19/2021    75.4         -1.6    0.810 g/cm2 AP Spine  L1-L2      09/19/2021    75.4         1.0     1.280 g/cm2 DualFemur Total Mean 09/19/2021    75.4         -1.3    0.838 g/cm2 World Health Organization [facility]) criteria for post-menopausal, Caucasian Women: Normal       T-score at or above -1 SD Osteopenia   T-score between -1 and  -2.5 SD Osteoporosis T-score at or below -2.5 SD RECOMMENDATION: 1. All patients should optimize calcium and vitamin D intake. 2. Consider FDA-approved medical therapies in postmenopausal women and men aged 65 years and older, based on the following: a. A hip or vertebral (clinical or morphometric) fracture. b. T-score = -2.5 at the femoral neck or spine  after appropriate evaluation to exclude secondary causes. c. Low bone mass (T-score between -1.0 and -2.5 at the femoral neck or spine) and a 10-year probability of a hip fracture = 3% or a 10-year probability of a major osteoporosis-related fracture = 20% based on the [place]-adapted WHO algorithm. d. Clinician judgment and/or patient preferences may indicate treatment for people with 10-year fracture probabilities above or below these levels. FOLLOW-UP: Patients with diagnosis of osteoporosis or at high risk for fracture should have regular bone mineral density tests.? Patients eligible for Medicare are allowed routine testing every 2 years.? The testing frequency can be increased to one year for patients who have rapidly progressing disease, are receiving or discontinuing medical therapy to restore bone mass, or have additional risk factors. I have reviewed this study and agree with the findings. [facility] FRAX* 10-year Probability of Fracture Based on femoral neck BMD: DualFemur (Left) Major Osteoporotic Fracture: 4.7% Hip Fracture:                0.8% Population:                  [place] (Black) Risk Factors:                Secondary Osteoporosis *FRAX is a [place] [employer] for Metabolic Bone Disease, a [place] (WHO) [facility]. ASSESSMENT: The probability of a major osteoporotic fracture is 4.7 % within the next ten years. The probability of a hip fracture is 0.8 % within the next ten years. Electronically Signed   By: Elmer Picker M.D.   On: 09/19/2021 12:22     Assessment & Plan:   [name] was seen today for hypertension, osteoarthritis and hyperlipidemia.  Diagnoses and all orders for this visit:  Essential hypertension, benign- Her blood pressure is well controlled.  EKG is reassuring. -     Basic metabolic panel; Future -     CBC with Differential/Platelet; Future -     TSH; Future -     Hepatic function panel; Future -     EKG 12-Lead -     Hepatic function panel -     TSH -     CBC with Differential/Platelet -     Basic metabolic panel  Hyperlipidemia with target LDL less than 130- LDL goal achieved. Doing well on the statin  -     Lipid panel; Future -     TSH; Future -     Hepatic function panel; Future -     Hepatic function panel -     TSH -     Lipid panel  Flu vaccine need -     Flu Vaccine QUAD High Dose(Fluad)  OAB (overactive bladder)- Will increase the dose of Myrbetriq. -     mirabegron ER (MYRBETRIQ) 50 MG TB24 tablet; Take 1 tablet (50 mg total) by mouth daily.   I have discontinued Zarriah W. Deloney's mirabegron ER. I am also having her start on mirabegron ER. Additionally, I am having her maintain her Fish Oil, acetaminophen, pravastatin, potassium chloride SA, donepezil, levocetirizine, triamcinolone cream, carvedilol, spironolactone, and celecoxib.  Meds ordered this encounter  Medications   mirabegron ER (MYRBETRIQ) 50 MG TB24 tablet    Sig: Take 1 tablet (50 mg total) by mouth daily.    Dispense:  90 tablet    Refill:  1     Follow-up: Return in about 6 months (around 08/09/2022).  Scarlette Calico, MD

## 2022-02-08 NOTE — Progress Notes (Signed)
Subjective:   [name] is a 76 y.o. female who presents for Medicare Annual (Subsequent) preventive examination.  Review of Systems     Cardiac Risk Factors include: advanced age (>65mn, >>37women);dyslipidemia;family history of premature cardiovascular disease;hypertension     Objective:    Today's Vitals   02/08/22 0905  BP: 138/88  Pulse: 76  Temp: 98 F (36.7 C)  SpO2: 100%  Weight: 143 lb (64.9 kg)  Height: '5\' 4"'$  (1.626 m)  PainSc: 0-No pain   Body mass index is 24.55 kg/m.     02/08/2022    9:08 AM 08/17/2021    8:42 AM 02/17/2021    7:59 AM 01/06/2021   10:08 AM 12/26/2020   10:22 AM 09/10/2017   12:39 PM  Advanced Directives  Does Patient Have a Medical Advance Directive? Yes Yes No;Yes Yes Yes No  Type of [place]  Living will [place] Living will;Healthcare Power of Attorney   Does patient want to make changes to medical advance directive?     No - Patient declined   Copy of [place] Chart? No - copy requested    No - copy requested   Would patient like information on creating a medical advance directive?      No - Patient declined    Current Medications (verified) Outpatient Encounter Medications as of 02/08/2022  Medication Sig   acetaminophen (TYLENOL) 325 MG tablet Take 2 tablets (650 mg total) by mouth every 6 (six) hours as needed for mild pain (or temp > 100).   carvedilol (COREG) 3.125 MG tablet Take 1 tablet (3.125 mg total) by mouth 2 (two) times daily with a meal.   celecoxib (CELEBREX) 50 MG capsule Take 1 capsule (50 mg total) by mouth 2 (two) times daily.   donepezil (ARICEPT) 10 MG tablet Take one tablet at 10 mg daily   levocetirizine (XYZAL) 5 MG tablet Take 1 tablet (5 mg total) by mouth every evening.   Omega-3 Fatty Acids (FISH OIL) 1000 MG CAPS Take by mouth. Reported on 12/05/2015   potassium chloride SA (KLOR-CON) 20 MEQ  tablet Take 1 tablet (20 mEq total) by mouth daily.   pravastatin (PRAVACHOL) 40 MG tablet Take 1 tablet (40 mg total) by mouth daily.   spironolactone (ALDACTONE) 25 MG tablet Take 1 tablet (25 mg total) by mouth daily.   triamcinolone cream (KENALOG) 0.5 % Apply 1 application. topically 2 (two) times daily.   [DISCONTINUED] mirabegron ER (MYRBETRIQ) 25 MG TB24 tablet Take 1 tablet (25 mg total) by mouth daily.   No facility-administered encounter medications on file as of 02/08/2022.    Allergies (verified) Patient has no known allergies.   History: Past Medical History:  Diagnosis Date   Chronic bilateral low back pain without sciatica 12/26/2020   Deficiency anemia 06/29/2020   Essential hypertension, benign 07/11/2009   Hyperlipidemia    Hyperlipidemia with target LDL less than 130 06/29/2020   The 10-year ASCVD risk score (Mikey BussingDC Jr., et al., 2013) is: 16.3%   Values used to calculate the score:     Age: [age]years     Sex: Female     Is Non-Hispanic African American: Yes     Diabetic: No     Tobacco smoker: No     Systolic Blood Pressure: 1952mmHg     Is BP treated: Yes     HDL Cholesterol: 53.6 mg/dL  Total Cholesterol: 179 mg/dL   Hypokalemia 06/29/2020   LAE (left atrial enlargement) 06/29/2020   Mild neurocognitive disorder due to Alzheimer's disease, possible 12/26/2020   OAB (overactive bladder) 12/26/2020   Polyp of colon 09/19/2020   Primary osteoarthritis of left shoulder 01/17/2021   Primary osteoarthritis of right shoulder 01/17/2021   Past Surgical History:  Procedure Laterality Date   LAPAROSCOPIC LYSIS OF ADHESIONS  09/08/2017   LAPAROSCOPY N/A 09/08/2017   Procedure: LYSIS OF ADHESIONS LAPAROSCOPIC;  Surgeon: Kieth Brightly Arta Bruce, MD;  Location: [facility] OR;  Service: General;  Laterality: N/A;   REPLACEMENT TOTAL KNEE Left    Family History  Problem Relation Age of Onset   Cancer Mother        STOMACH   Heart disease Father    Diabetes Sister    Diabetes  Brother    Breast cancer Neg Hx    Social History   Socioeconomic History   Marital status: Divorced    Spouse name: Not on file   Number of children: Not on file   Years of education: 16   Highest education level: Bachelor's degree (e.g., BA, AB, BS)  Occupational History   Occupation: Retired    Comment: Designer, multimedia  Tobacco Use   Smoking status: Never   Smokeless tobacco: Never  Vaping Use   Vaping Use: Never used  Substance and Sexual Activity   Alcohol use: No    Alcohol/week: 0.0 standard drinks of alcohol    Comment: occasional   Drug use: No   Sexual activity: Not Currently    Birth control/protection: None  Other Topics Concern   Not on file  Social History Narrative   Right handed    Lives alone    2 story home   Drinks caffeine prn   Social Determinants of Health   Financial Resource Strain: Low Risk  (02/08/2022)   Overall Financial Resource Strain (CARDIA)    Difficulty of Paying Living Expenses: Not hard at all  Food Insecurity: No Food Insecurity (02/08/2022)   Hunger Vital Sign    Worried About Running Out of Food in the Last Year: Never true    Ran Out of Food in the Last Year: Never true  Transportation Needs: No Transportation Needs (02/08/2022)   PRAPARE - Hydrologist (Medical): No    Lack of Transportation (Non-Medical): No  Physical Activity: Sufficiently Active (02/08/2022)   Exercise Vital Sign    Days of Exercise per Week: 7 days    Minutes of Exercise per Session: 30 min  Stress: No Stress Concern Present (02/08/2022)   [employer]    Feeling of Stress : Not at all  Social Connections: [place] (02/08/2022)   Social Connection and Isolation Panel [NHANES]    Frequency of Communication with Friends and Family: More than three times a week    Frequency of Social Gatherings with Friends and Family: More than three times a week     Attends Religious Services: More than 4 times per year    Active Member of [employer] or Organizations: Yes    Attends Music therapist: More than 4 times per year    Marital Status: Married    Tobacco Counseling Counseling given: Not Answered   Clinical Intake:  Pre-visit preparation completed: Yes  Pain : No/denies pain Pain Score: 0-No pain     BMI - recorded: 24.55 Nutritional Status: BMI of 19-24  Normal Nutritional  Risks: None Diabetes: No  How often do you need to have someone help you when you read instructions, pamphlets, or other written materials from your doctor or pharmacy?: 1 - Never What is the last grade level you completed in school?: HSG; [employer]'S [employer]GREE  Diabetic? no  Interpreter Needed?: No  Information entered by :: Lisette Abu, LPN.   Activities of Daily Living    02/08/2022    9:09 AM  In your present state of health, do you have any difficulty performing the following activities:  Hearing? 0  Vision? 0  Difficulty concentrating or making decisions? 0  Walking or climbing stairs? 0  Dressing or bathing? 0  Doing errands, shopping? 0  Preparing Food and eating ? N  Using the Toilet? N  In the past six months, have you accidently leaked urine? Y  Do you have problems with loss of bowel control? N  Managing your Medications? N  Managing your Finances? N  Housekeeping or managing your Housekeeping? N    Patient Care Team: [name], MD as PCP - General (Internal Medicine) [name], MD as PCP - Cardiology (Cardiology) Elease Hashimoto (Neurology)  Indicate any recent Medical Services you may have received from other than [facility] providers in the past year (date may be approximate).     Assessment:   This is a routine wellness examination for Storey.  Hearing/Vision screen Hearing Screening - Comments:: Denies hearing difficulties   Vision Screening - Comments:: Wears rx glasses - up to date with routine  eye exams with Jola Schmidt, MD.   Dietary issues and exercise activities discussed: Current Exercise Habits: Home exercise routine, Type of exercise: walking, Time (Minutes): 60, Frequency (Times/Week): 7, Weekly Exercise (Minutes/Week): 420, Intensity: Moderate, Exercise limited by: None identified   Goals Addressed             This Visit's Progress    cONTINIE TO WALK 1 MILE PER DAY, WATCH MY EATING BY INCREASING FRUITS AND USING NO SALT.        Depression Screen    02/08/2022    9:07 AM 02/08/2022    8:21 AM 12/26/2020   10:21 AM 09/19/2020    9:11 AM 06/29/2020    9:47 AM 08/13/2019    8:43 AM 07/31/2018   10:25 AM  PHQ 2/9 Scores  PHQ - 2 Score 0 0 0 0 0 0 0  PHQ- 9 Score 0 0    0     Fall Risk    02/08/2022    9:09 AM 08/17/2021    8:41 AM 02/17/2021    7:59 AM 01/06/2021   10:08 AM 12/26/2020   10:23 AM  Fall Risk   Falls in the past year? 0 0 0 1 1  Number falls in past yr: 0 0 0 1 0  Injury with Fall? 0 0 0 0 1  Risk for fall due to : No Fall Risks    Other (Comment)  Risk for fall due to: Comment     Patient fell on train  Follow up Falls prevention discussed    Falls evaluation completed    [place]:  Any stairs in or around the home? Yes  If so, are there any without handrails? No  Home free of loose throw rugs in walkways, pet beds, electrical cords, etc? Yes  Adequate lighting in your home to reduce risk of falls? Yes   ASSISTIVE DEVICES UTILIZED TO PREVENT  FALLS:  Life alert? No  Use of a cane, walker or w/c? No  Grab bars in the bathroom? No  Shower chair or bench in shower? No  Elevated toilet seat or a handicapped toilet? No   TIMED UP AND GO:  Was the test performed? Yes .  Length of time to ambulate 10 feet: 6 sec.   Gait steady and fast without use of assistive device  Cognitive Function:      01/06/2021   10:00 AM  Montreal Cognitive Assessment   Visuospatial/ Executive (0/5) 2  Naming (0/3) 2   Attention: Read list of digits (0/2) 1  Attention: Read list of letters (0/1) 0  Attention: Serial 7 subtraction starting at 100 (0/3) 3  Language: Repeat phrase (0/2) 0  Language : Fluency (0/1) 1  Abstraction (0/2) 1  Delayed Recall (0/5) 0  Orientation (0/6) 5  Total 15  Adjusted Score (based on education) 15      02/08/2022    9:09 AM  6CIT Screen  What Year? 0 points  What month? 0 points  What time? 0 points  Count back from 20 0 points  Months in reverse 0 points  Repeat phrase 0 points  Total Score 0 points    Immunizations Immunization History  Administered Date(s) Administered   Fluad Quad(high Dose 65+) 02/19/2020, 02/08/2022   Influenza, High Dose Seasonal PF 07/31/2018   PFIZER(Purple Top)SARS-COV-2 Vaccination 07/16/2019, 08/11/2019, 02/17/2020, 09/07/2020   Pneumococcal Polysaccharide-23 09/19/2020   Td 05/21/2009   Tdap 12/19/2011   Zoster Recombinat (Shingrix) 07/10/2017    TDAP status: Due, Education has been provided regarding the importance of this vaccine. Advised may receive this vaccine at local pharmacy or Health Dept. Aware to provide a copy of the vaccination record if obtained from local pharmacy or Health Dept. Verbalized acceptance and understanding.  Flu Vaccine status: Up to date  Pneumococcal vaccine status: Up to date  Covid-19 vaccine status: Completed vaccines  Qualifies for Shingles Vaccine? Yes   Zostavax completed Yes   Shingrix Completed?: No.    Education has been provided regarding the importance of this vaccine. Patient has been advised to call insurance company to determine out of pocket expense if they have not yet received this vaccine. Advised may also receive vaccine at local pharmacy or Health Dept. Verbalized acceptance and understanding.  Screening Tests Health Maintenance  Topic Date Due   Zoster Vaccines- Shingrix (2 of 2) 09/04/2017   Pneumonia Vaccine 68+ Years old (2 - PCV) 09/19/2021   TETANUS/TDAP   12/18/2021   INFLUENZA VACCINE  Completed   DEXA SCAN  Completed   Hepatitis C Screening  Completed   HPV VACCINES  Aged Out   COLONOSCOPY (Pts 45-23yr Insurance coverage will need to be confirmed)  Discontinued   COVID-19 Vaccine  Discontinued    Health Maintenance  Health Maintenance Due  Topic Date Due   Zoster Vaccines- Shingrix (2 of 2) 09/04/2017   Pneumonia Vaccine 76 Years old (2 - PCV) 09/19/2021   TETANUS/TDAP  12/18/2021    Colorectal cancer screening: No longer required.   Mammogram status: Completed 03/31/2021. Repeat every year  Bone Density status: Completed 09/19/2021. Results reflect: Bone density results: OSTEOPENIA. Repeat every 2-3 years.  Lung Cancer Screening: (Low Dose CT Chest recommended if Age 76-80years, 30 pack-year currently smoking OR have quit w/in 15years.) does not qualify.   Lung Cancer Screening Referral: no  Additional Screening:  Hepatitis C Screening: does qualify; Completed 07/31/2018  Vision Screening:  Recommended annual ophthalmology exams for early detection of glaucoma and other disorders of the eye. Is the patient up to date with their annual eye exam?  Yes  Who is the provider or what is the name of the office in which the patient attends annual eye exams? Jola Schmidt, MD. If pt is not established with a provider, would they like to be referred to a provider to establish care? No .   Dental Screening: Recommended annual dental exams for proper oral hygiene  Community Resource Referral / Chronic Care Management: CRR required this visit?  No   CCM required this visit?  No      Plan:     I have personally reviewed and noted the following in the patient's chart:   Medical and social history Use of alcohol, tobacco or illicit drugs  Current medications and supplements including opioid prescriptions. Patient is not currently taking opioid prescriptions. Functional ability and status Nutritional status Physical  activity Advanced directives List of other physicians Hospitalizations, surgeries, and ER visits in previous 12 months Vitals Screenings to include cognitive, depression, and falls Referrals and appointments  In addition, I have reviewed and discussed with patient certain preventive protocols, quality metrics, and best practice recommendations. A written personalized care plan for preventive services as well as general preventive health recommendations were provided to patient.     Sheral Flow, LPN   05/30/3157   Nurse Notes:

## 2022-02-08 NOTE — Patient Instructions (Signed)

## 2022-02-20 ENCOUNTER — Ambulatory Visit: Payer: Medicare PPO | Admitting: Physician Assistant

## 2022-02-21 DIAGNOSIS — Z79899 Other long term (current) drug therapy: Secondary | ICD-10-CM | POA: Diagnosis not present

## 2022-02-21 DIAGNOSIS — E785 Hyperlipidemia, unspecified: Secondary | ICD-10-CM | POA: Diagnosis not present

## 2022-02-21 DIAGNOSIS — I10 Essential (primary) hypertension: Secondary | ICD-10-CM | POA: Diagnosis not present

## 2022-02-21 DIAGNOSIS — I951 Orthostatic hypotension: Secondary | ICD-10-CM | POA: Diagnosis not present

## 2022-02-21 DIAGNOSIS — N3281 Overactive bladder: Secondary | ICD-10-CM | POA: Diagnosis not present

## 2022-02-21 DIAGNOSIS — M1612 Unilateral primary osteoarthritis, left hip: Secondary | ICD-10-CM | POA: Diagnosis not present

## 2022-02-22 DIAGNOSIS — M1612 Unilateral primary osteoarthritis, left hip: Secondary | ICD-10-CM | POA: Diagnosis not present

## 2022-02-22 DIAGNOSIS — N3281 Overactive bladder: Secondary | ICD-10-CM | POA: Diagnosis not present

## 2022-02-22 DIAGNOSIS — E785 Hyperlipidemia, unspecified: Secondary | ICD-10-CM | POA: Diagnosis not present

## 2022-02-22 DIAGNOSIS — I951 Orthostatic hypotension: Secondary | ICD-10-CM | POA: Diagnosis not present

## 2022-02-22 DIAGNOSIS — I10 Essential (primary) hypertension: Secondary | ICD-10-CM | POA: Diagnosis not present

## 2022-02-22 DIAGNOSIS — Z79899 Other long term (current) drug therapy: Secondary | ICD-10-CM | POA: Diagnosis not present

## 2022-03-05 IMAGING — DX DG SHOULDER 2+V*L*
3 series · 3 of 3 positions shown · non-contrast
Comparison: None.

CLINICAL DATA: Left shoulder pain after fall

EXAM:
LEFT SHOULDER - 2+ VIEW

[shoulder ap (1 of 2)]
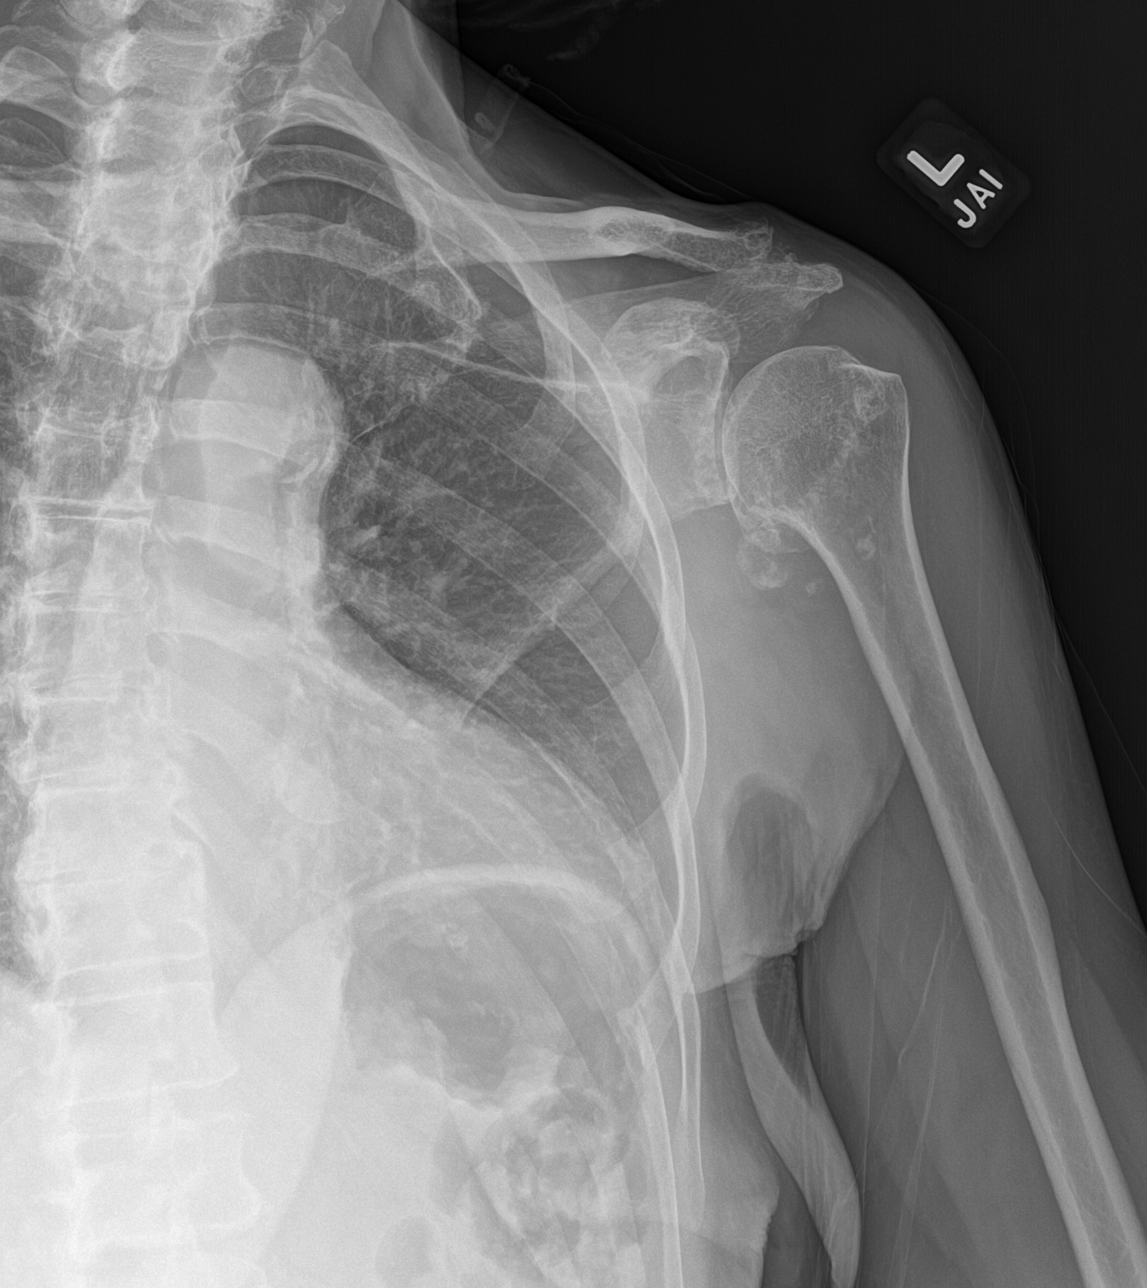

[shoulder ap (2 of 2)]
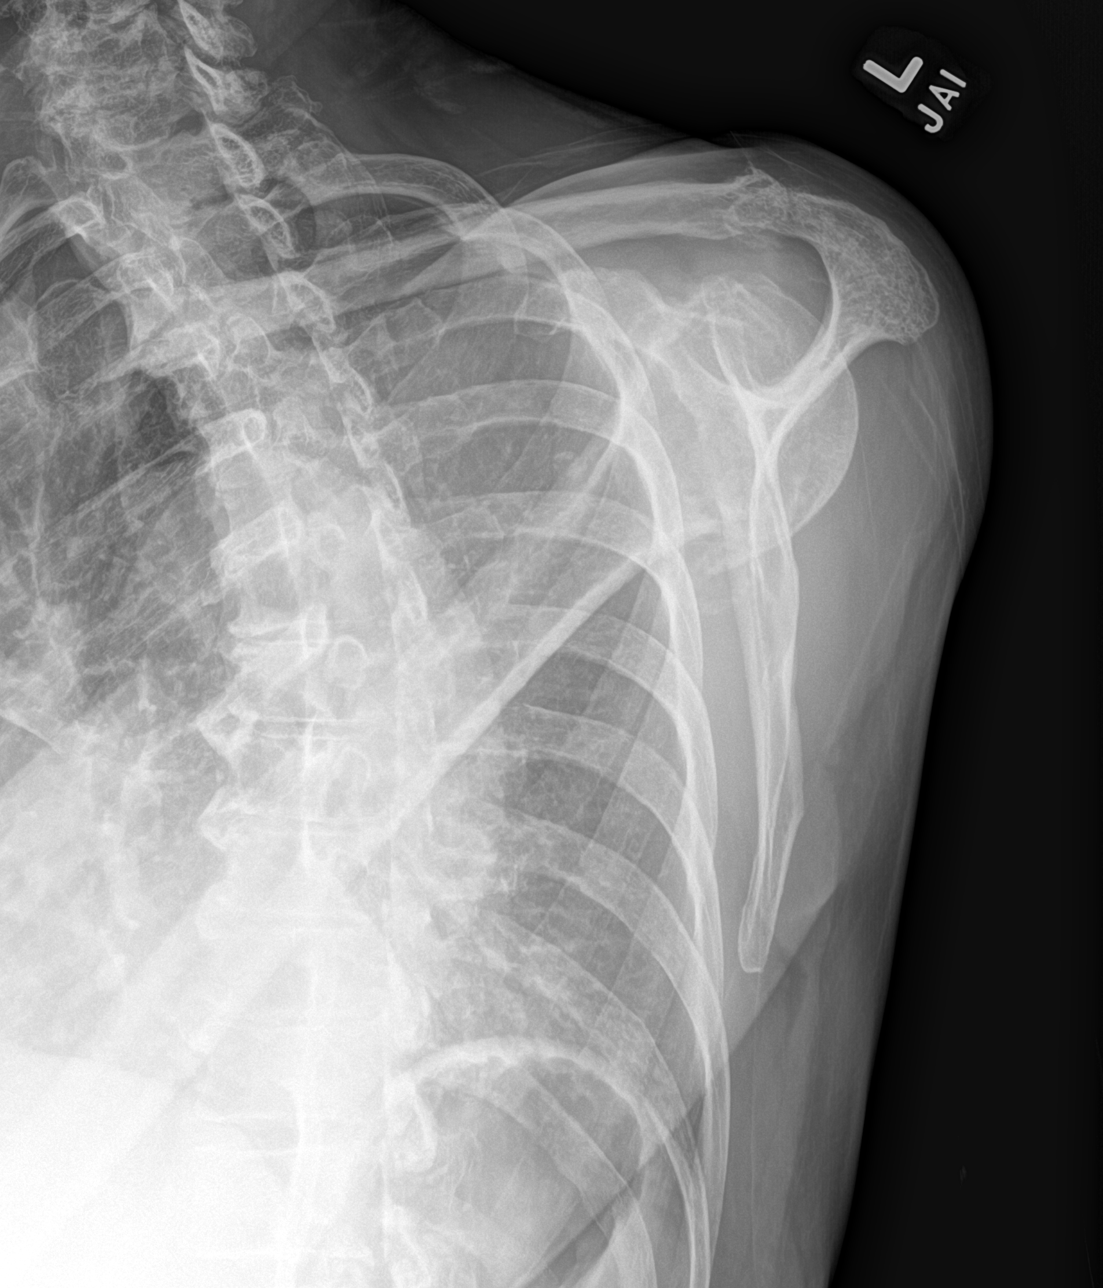

[shoulder axial]
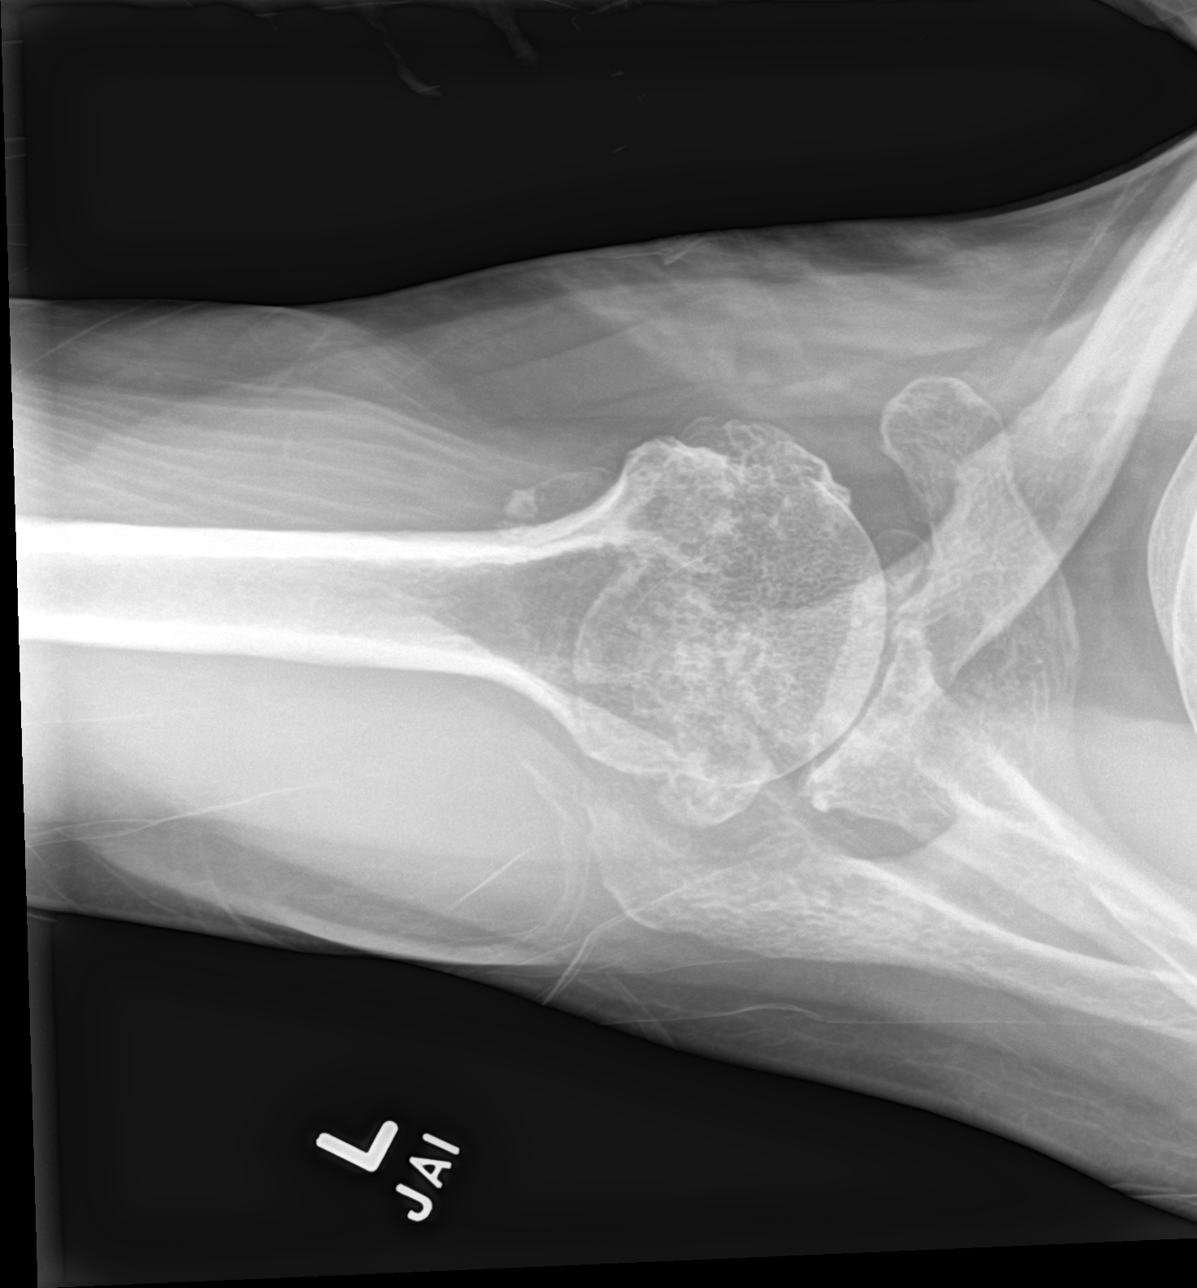

[3 of 3 positions shown; findings below may reference images not displayed]

FINDINGS: Frontal, transscapular, and axillary views of the left shoulder are
obtained. No acute fracture. There is severe acromioclavicular and
glenohumeral joint osteoarthritis with joint space narrowing,
osteophyte formation, bony remodeling most pronounced at the
glenohumeral joint. Synovial osteochondromatosis is seen inferior to
the humeral head. Visualized portions of the left chest are clear.
IMPRESSION: 1. Severe left shoulder osteoarthritis with loose bodies in the
glenohumeral joint as above. No acute displaced fracture.

## 2022-03-09 DIAGNOSIS — M25552 Pain in left hip: Secondary | ICD-10-CM | POA: Diagnosis not present

## 2022-03-09 DIAGNOSIS — Z471 Aftercare following joint replacement surgery: Secondary | ICD-10-CM | POA: Diagnosis not present

## 2022-03-09 DIAGNOSIS — Z96642 Presence of left artificial hip joint: Secondary | ICD-10-CM | POA: Diagnosis not present

## 2022-03-27 ENCOUNTER — Other Ambulatory Visit: Payer: Self-pay | Admitting: Internal Medicine

## 2022-03-27 DIAGNOSIS — Z1231 Encounter for screening mammogram for malignant neoplasm of breast: Secondary | ICD-10-CM

## 2022-03-28 ENCOUNTER — Ambulatory Visit (INDEPENDENT_AMBULATORY_CARE_PROVIDER_SITE_OTHER): Payer: Medicare PPO | Admitting: Internal Medicine

## 2022-03-28 ENCOUNTER — Encounter: Payer: Self-pay | Admitting: Internal Medicine

## 2022-03-28 VITALS — BP 138/86 | HR 66 | Temp 98.3°F | Ht 64.0 in | Wt 139.0 lb

## 2022-03-28 DIAGNOSIS — I10 Essential (primary) hypertension: Secondary | ICD-10-CM | POA: Diagnosis not present

## 2022-03-28 DIAGNOSIS — E785 Hyperlipidemia, unspecified: Secondary | ICD-10-CM | POA: Diagnosis not present

## 2022-03-28 DIAGNOSIS — Z23 Encounter for immunization: Secondary | ICD-10-CM

## 2022-03-28 DIAGNOSIS — Z0001 Encounter for general adult medical examination with abnormal findings: Secondary | ICD-10-CM | POA: Diagnosis not present

## 2022-03-28 MED ORDER — CARVEDILOL 3.125 MG PO TABS: 3.1250 mg | ORAL_TABLET | Freq: Two times a day (BID) | ORAL | 1 refills | Status: DC

## 2022-03-28 NOTE — Progress Notes (Signed)
Subjective:  Patient ID: [name] [name], female    DOB: 08-04-1945  Age: 76 y.o. MRN: [id number]  CC: Annual Exam, Hypertension, and Hyperlipidemia   HPI [name] [name] presents for a CPX and f/up -  She is doing well status post left total hip replacement about a month ago.  She is active in [name] [name] home and denies chest pain, shortness of breath, diaphoresis, or edema.   Outpatient Medications Prior to Visit  Medication Sig Dispense Refill   acetaminophen (TYLENOL) 325 MG tablet Take 2 tablets (650 mg total) by mouth every 6 (six) hours as needed for mild pain (or temp > 100).     celecoxib (CELEBREX) 50 MG capsule Take 1 capsule (50 mg total) by mouth 2 (two) times daily. 180 capsule 1   donepezil (ARICEPT) 10 MG tablet Take one tablet at 10 mg daily 30 tablet 11   levocetirizine (XYZAL) 5 MG tablet Take 1 tablet (5 mg total) by mouth every evening. 90 tablet 1   mirabegron ER (MYRBETRIQ) 50 MG TB24 tablet Take 1 tablet (50 mg total) by mouth daily. 90 tablet 1   Omega-3 Fatty Acids (FISH OIL) 1000 MG CAPS Take by mouth. Reported on 12/05/2015     potassium chloride SA (KLOR-CON) 20 MEQ tablet Take 1 tablet (20 mEq total) by mouth daily. 90 tablet 0   pravastatin (PRAVACHOL) 40 MG tablet Take 1 tablet (40 mg total) by mouth daily. 90 tablet 1   spironolactone (ALDACTONE) 25 MG tablet Take 1 tablet (25 mg total) by mouth daily. 90 tablet 1   triamcinolone cream (KENALOG) 0.5 % Apply 1 application. topically 2 (two) times daily. 60 g 2   carvedilol (COREG) 3.125 MG tablet Take 1 tablet (3.125 mg total) by mouth 2 (two) times daily with a meal. 180 tablet 1   No facility-administered medications prior to visit.    ROS Review of Systems  Constitutional: Negative.  Negative for diaphoresis and fatigue.  HENT: Negative.    Eyes: Negative.   Respiratory: Negative.  Negative for cough, chest tightness, shortness of breath and wheezing.   Cardiovascular:  Negative for chest  pain, palpitations and leg swelling.  Gastrointestinal:  Negative for abdominal pain, constipation, diarrhea and nausea.  Endocrine: Negative.   Genitourinary: Negative.  Negative for difficulty urinating.  Musculoskeletal:  Positive for arthralgias. Negative for back pain and myalgias.  Skin: Negative.   Neurological: Negative.  Negative for dizziness and light-headedness.  Hematological:  Negative for adenopathy. Does not bruise/bleed easily.  Psychiatric/Behavioral: Negative.      Objective:  BP 138/86 (BP Location: Left Arm, Patient Position: Sitting, Cuff Size: Large)   Pulse 66   Temp 98.3 F (36.8 C) (Oral)   Ht '5\' 4"'$  (1.626 m)   Wt 139 lb (63 kg)   SpO2 99%   BMI 23.86 kg/m   BP Readings from Last 3 Encounters:  03/28/22 138/86  02/08/22 138/88  02/08/22 138/88    Wt Readings from Last 3 Encounters:  03/28/22 139 lb (63 kg)  02/08/22 143 lb (64.9 kg)  02/08/22 143 lb (64.9 kg)    Physical Exam Vitals reviewed.  HENT:     Mouth/Throat:     Mouth: Mucous membranes are moist.  Eyes:     General: No scleral icterus.    Conjunctiva/sclera: Conjunctivae normal.  Cardiovascular:     Rate and Rhythm: Normal rate and regular rhythm.     Heart sounds: No murmur heard. Pulmonary:  Effort: Pulmonary effort is normal.     Breath sounds: No stridor. No wheezing, rhonchi or rales.  Abdominal:     General: Abdomen is flat.     Palpations: There is no mass.     Tenderness: There is no abdominal tenderness. There is no guarding.     Hernia: No hernia is present.  Musculoskeletal:        General: No swelling. Normal range of motion.     Cervical back: Neck supple.     Right lower leg: No edema.     Left lower leg: No edema.  Lymphadenopathy:     Cervical: No cervical adenopathy.  Skin:    General: Skin is warm and dry.  Neurological:     General: No focal deficit present.     Mental Status: She is alert.  Psychiatric:        Mood and Affect: Mood normal.         Behavior: Behavior normal.     Lab Results  Component Value Date   WBC 4.0 02/08/2022   HGB 12.5 02/08/2022   HCT 37.4 02/08/2022   PLT 178.0 02/08/2022   GLUCOSE 96 02/08/2022   CHOL 233 (H) 02/08/2022   TRIG 133.0 02/08/2022   HDL 57.60 02/08/2022   LDLCALC 149 (H) 02/08/2022   ALT 15 02/08/2022   AST 24 02/08/2022   NA 141 02/08/2022   K 3.8 02/08/2022   CL 103 02/08/2022   CREATININE 0.88 02/08/2022   BUN 16 02/08/2022   CO2 33 (H) 02/08/2022   TSH 2.33 02/08/2022   INR 1.6 (H) 11/01/2006   HGBA1C 5.9 08/13/2019    DG Bone Density  Result Date: 09/19/2021 EXAM: DUAL X-RAY ABSORPTIOMETRY (DXA) FOR BONE MINERAL DENSITY IMPRESSION: Referring Physician:  [name] Your patient completed a bone mineral density test using GE Lunar iDXA system (analysis version: 16). Technologist: KAT PATIENT: Name: [name] [name] Patient ID: [id number] Birth Date: 04/06/1946 Height: 62.0 in. Sex: Female Measured: 09/19/2021 Weight: 141.2 lbs. Indications: Advanced Age, Aricept, Estrogen Deficient, Height Loss (781.91), Postmenopausal, Secondary Osteoporosis Fractures: NONE Treatments: Calcium (E943.0) ASSESSMENT: The BMD measured at Femur Total Left is 0.810 g/cm2 with a T-score of -1.6. This patient is considered osteopenic/low bone mass according to [facility]) criteria. The quality of the exam is good. L3, L4 were excluded due to degenerative changes. Site Region Measured Date Measured Age YA BMD Significant CHANGE T-score DualFemur Total Left 09/19/2021    75.4         -1.6    0.810 g/cm2 AP Spine  L1-L2      09/19/2021    75.4         1.0     1.280 g/cm2 DualFemur Total Mean 09/19/2021    75.4         -1.3    0.838 g/cm2 World Health Organization [facility]) criteria for post-menopausal, Caucasian Women: Normal       T-score at or above -1 SD Osteopenia   T-score between -1 and -2.5 SD Osteoporosis T-score at or below -2.5 SD RECOMMENDATION: 1. All patients should optimize  calcium and vitamin D intake. 2. Consider FDA-approved medical therapies in postmenopausal women and men aged 66 years and older, based on the following: a. A hip or vertebral (clinical or morphometric) fracture. b. T-score = -2.5 at the femoral neck or spine after appropriate evaluation to exclude secondary causes. c. Low bone mass (T-score between -1.0 and -2.5 at the femoral  neck or spine) and a 10-year probability of a hip fracture = 3% or a 10-year probability of a major osteoporosis-related fracture = 20% based on the [place]-adapted WHO algorithm. d. Clinician judgment and/or patient preferences may indicate treatment for people with 10-year fracture probabilities above or below these levels. FOLLOW-UP: Patients with diagnosis of osteoporosis or at high risk for fracture should have regular bone mineral density tests.? Patients eligible for Medicare are allowed routine testing every 2 years.? The testing frequency can be increased to one year for patients who have rapidly progressing disease, are receiving or discontinuing medical therapy to restore bone mass, or have additional risk factors. I have reviewed this study and agree with the findings. [facility] FRAX* 10-year Probability of Fracture Based on femoral neck BMD: DualFemur (Left) Major Osteoporotic Fracture: 4.7% Hip Fracture:                0.8% Population:                  [place] (Black) Risk Factors:                Secondary Osteoporosis *FRAX is a [place] [employer] for Metabolic Bone Disease, a [place] (WHO) [facility]. ASSESSMENT: The probability of a major osteoporotic fracture is 4.7 % within the next ten years. The probability of a hip fracture is 0.8 % within the next ten years. Electronically Signed   By: Elmer Picker M.D.   On: 09/19/2021 12:22    Assessment & Plan:   [name] [name] was seen today for annual exam, hypertension and  hyperlipidemia.  Diagnoses and all orders for this visit:  Hyperlipidemia with target LDL less than 130- LDL goal achieved. Doing well on the statin   Essential hypertension, benign- [name] [name] BP is well controlled. -     carvedilol (COREG) 3.125 MG tablet; Take 1 tablet (3.125 mg total) by mouth 2 (two) times daily with a meal.  Encounter for general adult medical examination with abnormal findings- Exam completed, labs reviewed, vaccines reviewed and updated, no cancer screenings indicated, patient education was given.  Need for prophylactic vaccination with combined diphtheria-tetanus-pertussis (DTP) vaccine -     Tdap (BOOSTRIX) 5-2.5-18.5 LF-MCG/0.5 injection; Inject 0.5 mLs into the muscle once for 1 dose.   I am having [name] [name] start on Boostrix. I am also having [name] [name] maintain [name] [name] Fish Oil, acetaminophen, pravastatin, potassium chloride SA, donepezil, levocetirizine, triamcinolone cream, spironolactone, celecoxib, mirabegron ER, and carvedilol.  Meds ordered this encounter  Medications   carvedilol (COREG) 3.125 MG tablet    Sig: Take 1 tablet (3.125 mg total) by mouth 2 (two) times daily with a meal.    Dispense:  180 tablet    Refill:  1   Tdap (BOOSTRIX) 5-2.5-18.5 LF-MCG/0.5 injection    Sig: Inject 0.5 mLs into the muscle once for 1 dose.    Dispense:  0.5 mL    Refill:  0     Follow-up: Return in about 6 months (around 09/26/2022).  Scarlette Calico, MD

## 2022-03-28 NOTE — Patient Instructions (Signed)

## 2022-03-31 MED ORDER — BOOSTRIX 5-2.5-18.5 LF-MCG/0.5 IM SUSP: 0.5000 mL | Freq: Once | INTRAMUSCULAR | 0 refills | Status: AC

## 2022-04-02 ENCOUNTER — Ambulatory Visit
Admission: RE | Admit: 2022-04-02 | Discharge: 2022-04-02 | Disposition: A | Discharge status: Home / Self Care | Payer: Medicare PPO | Source: Ambulatory Visit | Attending: Internal Medicine | Admitting: Internal Medicine

## 2022-04-02 DIAGNOSIS — Z1231 Encounter for screening mammogram for malignant neoplasm of breast: Secondary | ICD-10-CM

## 2022-04-10 ENCOUNTER — Ambulatory Visit (INDEPENDENT_AMBULATORY_CARE_PROVIDER_SITE_OTHER): Payer: Medicare PPO | Admitting: Physician Assistant

## 2022-04-10 ENCOUNTER — Encounter: Payer: Self-pay | Admitting: Physician Assistant

## 2022-04-10 VITALS — BP 150/90 | HR 82 | Resp 18 | Ht 64.0 in | Wt 141.0 lb

## 2022-04-10 DIAGNOSIS — R413 Other amnesia: Secondary | ICD-10-CM | POA: Diagnosis not present

## 2022-04-10 DIAGNOSIS — G309 Alzheimer's disease, unspecified: Secondary | ICD-10-CM

## 2022-04-10 DIAGNOSIS — F0670 Mild neurocognitive disorder due to known physiological condition without behavioral disturbance: Secondary | ICD-10-CM | POA: Diagnosis not present

## 2022-04-10 MED ORDER — DONEPEZIL HCL 10 MG PO TABS: ORAL_TABLET | ORAL | 11 refills | Status: DC

## 2022-04-10 NOTE — Patient Instructions (Addendum)
It was a pleasure to see you today at our office.   Recommendations:  Follow up in  6 months Continue donepezil 10 mg daily. Side effects were discussed  Repeat Neuropsychological testing for clarity of diagnosis    Whom to call:  Memory  decline, memory medications: Call our office [phone]   For psychiatric meds, mood meds: Please have your primary care physician manage these medications.    For assessment of decision of mental capacity and competency:  Call Dr. Anthoney Harada, geriatric psychiatrist at [phone]  For guidance in geriatric dementia issues please call [facility] [phone]  For guidance regarding [facility] and if placement were needed at the facility, contact Arnell Asal, Social Worker tel: [phone]  If you have any severe symptoms of a stroke, or other severe issues such as confusion,severe chills or fever, etc call 911 or go to the ER as you may need to be evaluated further   Feel free to visit Facebook page " [email or web address]" for tips of how to care for people with memory problems.   Feel free to go to the following database for funded clinical studies conducted around the world: http://saunders.com/   https://www.triadclinicaltrials.com/     RECOMMENDATIONS FOR ALL PATIENTS WITH MEMORY PROBLEMS: 1. Continue to exercise (Recommend 30 minutes of walking everyday, or 3 hours every week) 2. Increase social interactions - continue going to [place] and enjoy social gatherings with friends and family 3. Eat healthy, avoid fried foods and eat more fruits and vegetables 4. Maintain adequate blood pressure, blood sugar, and blood cholesterol level. Reducing the risk of stroke and cardiovascular disease also helps promoting better memory. 5. Avoid stressful situations. Live a simple life and avoid aggravations. Organize your time and prepare for the next day in anticipation. 6. Sleep well, avoid any interruptions of sleep  and avoid any distractions in the bedroom that may interfere with adequate sleep quality 7. Avoid sugar, avoid sweets as there is a strong link between excessive sugar intake, diabetes, and cognitive impairment We discussed the Mediterranean diet, which has been shown to help patients reduce the risk of progressive memory disorders and reduces cardiovascular risk. This includes eating fish, eat fruits and green leafy vegetables, nuts like almonds and hazelnuts, walnuts, and also use olive oil. Avoid fast foods and fried foods as much as possible. Avoid sweets and sugar as sugar use has been linked to worsening of memory function.  There is always a concern of gradual progression of memory problems. If this is the case, then we may need to adjust level of care according to patient needs. Support, both to the patient and caregiver, should then be put into place.    The Alzheimer[employer] Association is here all day, every day for people facing Alzheimer's disease through our free 24/7 Helpline: [phone]. The Helpline provides reliable information and support to all those who need assistance, such as individuals living with memory loss, Alzheimer's or other dementia, caregivers, health care professionals and the public.  Our highly trained and knowledgeable staff can help you with: Understanding memory loss, dementia and Alzheimer's  Medications and other treatment options  General information about aging and brain health  Skills to provide quality care and to find the best care from professionals  Legal, financial and living-arrangement decisions Our Helpline also features: Confidential care consultation provided by master's level clinicians who can help with decision-making support, crisis assistance and education on issues families face every day  Help in a caller's  preferred language using our translation service that features more than 200 languages and dialects  Referrals to local community  programs, services and ongoing support     FALL PRECAUTIONS: Be cautious when walking. Scan the area for obstacles that may increase the risk of trips and falls. When getting up in the mornings, sit up at the edge of the bed for a few minutes before getting out of bed. Consider elevating the bed at the head end to avoid drop of blood pressure when getting up. Walk always in a well-lit room (use night lights in the walls). Avoid area rugs or power cords from appliances in the middle of the walkways. Use a walker or a cane if necessary and consider physical therapy for balance exercise. Get your eyesight checked regularly.  FINANCIAL OVERSIGHT: Supervision, especially oversight when making financial decisions or transactions is also recommended.  HOME SAFETY: Consider the safety of the kitchen when operating appliances like stoves, microwave oven, and blender. Consider having supervision and share cooking responsibilities until no longer able to participate in those. Accidents with firearms and other hazards in the house should be identified and addressed as well.   ABILITY TO BE LEFT ALONE: If patient is unable to contact 911 operator, consider using LifeLine, or when the need is there, arrange for someone to stay with patients. Smoking is a fire hazard, consider supervision or cessation. Risk of wandering should be assessed by caregiver and if detected at any point, supervision and safe proof recommendations should be instituted.  MEDICATION SUPERVISION: Inability to self-administer medication needs to be constantly addressed. Implement a mechanism to ensure safe administration of the medications.   DRIVING: Regarding driving, in patients with progressive memory problems, driving will be impaired. We advise to have someone else do the driving if trouble finding directions or if minor accidents are reported. Independent driving assessment is available to determine safety of driving.   If you are  interested in the driving assessment, you can contact the following:  [employer] in [place]  Sidney Painter [phone] or [phone]      [place] refers to food and lifestyle choices that are based on the traditions of countries located on the [employer]. This way of eating has been shown to help prevent certain conditions and improve outcomes for people who have chronic diseases, like kidney disease and heart disease. What are tips for following this plan? Lifestyle  Cook and eat meals together with your family, when possible. Drink enough fluid to keep your urine clear or pale yellow. Be physically active every day. This includes: Aerobic exercise like running or swimming. Leisure activities like gardening, walking, or housework. Get 7-8 hours of sleep each night. If recommended by your health care provider, drink red wine in moderation. This means 1 glass a day for nonpregnant women and 2 glasses a day for men. A glass of wine equals 5 oz (150 mL). Reading food labels  Check the serving size of packaged foods. For foods such as rice and pasta, the serving size refers to the amount of cooked product, not dry. Check the total fat in packaged foods. Avoid foods that have saturated fat or trans fats. Check the ingredients list for added sugars, such as corn syrup. Shopping  At the grocery store, buy most of your food from the areas near the walls of the store. This includes: Fresh fruits and vegetables (  produce). Grains, beans, nuts, and seeds. Some of these may be available in unpackaged forms or large amounts (in bulk). Fresh seafood. Poultry and eggs. Low-fat dairy products. Buy whole ingredients instead of prepackaged foods. Buy fresh fruits and vegetables in-season from local farmers markets. Buy frozen fruits and  vegetables in resealable bags. If you do not have access to quality fresh seafood, buy precooked frozen shrimp or canned fish, such as tuna, salmon, or sardines. Buy small amounts of raw or cooked vegetables, salads, or olives from the deli or salad bar at your store. Stock your pantry so you always have certain foods on hand, such as olive oil, canned tuna, canned tomatoes, rice, pasta, and beans. Cooking  Cook foods with extra-virgin olive oil instead of using butter or other vegetable oils. Have meat as a side dish, and have vegetables or grains as your main dish. This means having meat in small portions or adding small amounts of meat to foods like pasta or stew. Use beans or vegetables instead of meat in common dishes like chili or lasagna. Experiment with different cooking methods. Try roasting or broiling vegetables instead of steaming or sauteing them. Add frozen vegetables to soups, stews, pasta, or rice. Add nuts or seeds for added healthy fat at each meal. You can add these to yogurt, salads, or vegetable dishes. Marinate fish or vegetables using olive oil, lemon juice, garlic, and fresh herbs. Meal planning  Plan to eat 1 vegetarian meal one day each week. Try to work up to 2 vegetarian meals, if possible. Eat seafood 2 or more times a week. Have healthy snacks readily available, such as: Vegetable sticks with hummus. Greek yogurt. Fruit and nut trail mix. Eat balanced meals throughout the week. This includes: Fruit: 2-3 servings a day Vegetables: 4-5 servings a day Low-fat dairy: 2 servings a day Fish, poultry, or lean meat: 1 serving a day Beans and legumes: 2 or more servings a week Nuts and seeds: 1-2 servings a day Whole grains: 6-8 servings a day Extra-virgin olive oil: 3-4 servings a day Limit red meat and sweets to only a few servings a month What are my food choices? Mediterranean diet Recommended Grains: Whole-grain pasta. Brown rice. Bulgar wheat. Polenta.  Couscous. Whole-wheat bread. [place]. Vegetables: Artichokes. Beets. Broccoli. Cabbage. Carrots. Eggplant. Green beans. Chard. Kale. Spinach. Onions. Leeks. Peas. Squash. Tomatoes. Peppers. Radishes. Fruits: Apples. Apricots. Avocado. Berries. Bananas. Cherries. Dates. Figs. Grapes. Lemons. Melon. Oranges. Peaches. Plums. Pomegranate. Meats and other protein foods: Beans. Almonds. Sunflower seeds. Pine nuts. Peanuts. [place]. Salmon. Scallops. Shrimp. [place]. Tilapia. Clams. Oysters. Eggs. Dairy: Low-fat milk. Cheese. Greek yogurt. Beverages: Water. Red wine. Herbal tea. Fats and oils: Extra virgin olive oil. Avocado oil. Grape seed oil. Sweets and desserts: [place] yogurt with honey. Baked apples. Poached pears. Trail mix. Seasoning and other foods: Basil. Cilantro. Coriander. Cumin. Mint. Parsley. Sage. Rosemary. Tarragon. Garlic. Oregano. Thyme. Pepper. Balsalmic vinegar. Tahini. Hummus. Tomato sauce. Olives. Mushrooms. Limit these Grains: Prepackaged pasta or rice dishes. Prepackaged cereal with added sugar. Vegetables: Deep fried potatoes (french fries). Fruits: Fruit canned in syrup. Meats and other protein foods: Beef. Pork. Lamb. Poultry with skin. Hot dogs. [place] Salines. Dairy: Ice cream. Sour cream. Whole milk. Beverages: Juice. Sugar-sweetened soft drinks. Beer. Liquor and spirits. Fats and oils: Butter. Canola oil. Vegetable oil. Beef fat (tallow). Lard. Sweets and desserts: Cookies. Cakes. Pies. Candy. Seasoning and other foods: Mayonnaise. Premade sauces and marinades. The items listed may not be a complete list. Talk with your  dietitian about what dietary choices are right for you. Summary The Mediterranean diet includes both food and lifestyle choices. Eat a variety of fresh fruits and vegetables, beans, nuts, seeds, and whole grains. Limit the amount of red meat and sweets that you eat. Talk with your health care provider about whether it is safe for you to drink red wine in  moderation. This means 1 glass a day for nonpregnant women and 2 glasses a day for men. A glass of wine equals 5 oz (150 mL). This information is not intended to replace advice given to you by your health care provider. Make sure you discuss any questions you have with your health care provider. Document Released: 12/29/2015 Document Revised: 01/31/2016 Document Reviewed: 12/29/2015 Elsevier Interactive Patient Education  2017 [employer].

## 2022-04-10 NOTE — Progress Notes (Signed)
Assessment/Plan:   Mild Cognitive Impairment likely due to Alzheimer's Disease    [name] is a very pleasant 76 y.o. RH female  with a history of hypertension, hyperlipidemia, chronic pain due to severe osteoarthritis and mild neurocognitive disorder likely due to Alzheimer's disease as per  Neurocognitive testing in September 2022.  Presenting today in follow-up for evaluation of memory loss. Patient is on donepezil 10 mg daily, tolerating well. MMSE today is 25/30. Stable from the cognitive standpoint.      Recommendations:   Follow up in 6  months. Repeat neurocognitive testing for clarity of the diagnosis and disease progression Continue to control mood as per PCP Recommend control of cardiovascular risk factors.    Subjective:   This patient is accompanied in the office by her daughter  who supplements the history. Previous records as well as any outside records available were reviewed prior to todays visit.  She was last seen on 08/17/2021    Any changes in memory since last visit?  She continues to stay busy, making schedule for her activities to help her stay organized.  I stay home and go to the [facility] and see what activities they have   She also enjoys doing word finding, coloring books, reading and testing herself with the Bible.  She likes going to [place], speaking to missionaries every Sunday with some lists." I make words from another stem word".  repeats oneself?  Endorsed, "It is better".  Disoriented when walking into a room?  Patient denies   Leaving objects in unusual places?  Patient denies   Ambulates  with difficulty?   She tries to stay active, walking at home. On Oct 4 she had L hip replacement in [place],  did PT for 2 weeks, and to continue PT here  Recent falls?  Patient denies  Any head injuries?  Patient denies   History of seizures?   Patient denies   Wandering behavior?  Patient denies   Patient  drives? About 4 miles radius  Any mood changes since last visit?  Patient denies   Any worsening depression?:  Patient denies   Hallucinations?  Patient denies   Paranoia?  Patient denies   Patient reports that sleeps well without vivid dreams, REM behavior or sleepwalking   History of sleep apnea?  Patient denies   Any hygiene concerns?  Patient denies   Independent of bathing and dressing?  Endorsed  Does the patient needs help with medications?  Patient is in charge   Who is in charge of the finances?  Patient is in charge    Any changes in appetite? Since I  have been home since Nov 4, I am eating healthy. Does not drink enough water her daughter reports Patient have trouble swallowing? Patient denies   Does the patient cook?  Patient denies   Any kitchen accidents such as leaving the stove on? Patient denies   Any headaches?  Patient denies   Double vision? Patient denies   Any focal numbness or tingling?  Patient denies   Chronic back pain Patient denies   Unilateral weakness?  Patient denies   Any tremors?  Patient denies   Any history of anosmia?  Patient denies   Any incontinence of urine? " Only wear pads if I go somewhere".  Any bowel dysfunction?   Patient denies      Patient lives alone     Initial evaluation 02/06/2021 the patient is seen in  neurologic consultation at the request of [name], MD for the evaluation of memory.  The patient is here alone.She is   a 75 y.o. year old female who has had memory issues for about one year.  She states that she cannot recall as well forgetting new information after a few minutes.Her long term memory is good.  During this time, she has been writing "anything I need to remember because otherwise I will not ".  She recently has told her family about these changes, as she was "trying to figure out what was going".  She denies any changes in mood, or history of depression or anxiety.  She denies irritability.  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 She does not sleep well for the last year, as she wakes up several times to urinate between 2-4 times a night.  She denies any vivid dreams or sleepwalking, hallucinations, paranoia, or leaving objects in unusual places.  At times she cannot find her keys to the house or to the car, so she has been keeping them in a necklace.  She is independent of dressing and bathing.  At times, she forgets to take medications, but has not done so lately because she writes it down . She denies forgetting to pay bills.  Her appetite is normal, but she has lost some weight since she is not going to eat out as often as before.  Denies any trouble swallowing. She loves to cook, and denies leaving the stove or the faucet on.  She ambulates without difficulty without the use of a walker or a cane, but she does admit that is hard at times because of the constant arthritic pain.  She suffered a fall in May 31 when the train she was in, coming from [place] to [place] made a shortstop, and she hurt her knee, but did not lose consciousness, or hit her head.  She never had a concussion or seizures. She continues to drive, denies getting lost, unless she takes an unfamiliar road where she needs the GPS. Denies headaches, double vision, dizziness, focal numbness or tingling, unilateral weakness or tremors, denies constipation or diarrhea.  She has anosmia for the last 2 years, denies having had COVID.  She denies a history of sleep apnea, alcohol, or tobacco.  She denies family history of dementia.   Neurocognitive evaluation 01/25/2021 Dr. Melvyn Novas "results suggested severe impairment surrounding all aspects of learning and memory. Additional impairments were  exhibited across confrontation naming and several aspects of executive functioning (namely cognitive flexibility and response inhibition). A relative weakness was further exhibited across semantic fluency and receptive language. Regarding etiology, I have concerns surrounding the presence of Alzheimer's disease. Across memory tasks, [name] had significant trouble learning new information and did not benefit from repeated exposure. After a delay, she was essentially amnestic across all assessments. Performance across yes/no recognition tasks was mildly variable, but overall below expectation as she benefited from correct guessing at times. Overall, memory performance suggests a strong storage deficit, which is the hallmark characteristic of this disease process. Additionally, impairments in confrontation naming and a noted discrepancy between semantic and phonemic fluency performances (with the former being consistently worse) is also very consistent with expected  patterns of dysfunction with this disease process."       Labs 09/19/20 BMP normal CBC normal TSH normal 1.25 Iron 86, normal Ferritin 31.1 on the lower normal B1: 12 on the lower normal Magnesium 2.2 normal LDH normal 202, reticulocyte's 0.8%, normal, fetal hemoglobin less than 1%, normal, hemoglobin A2 2.6, normal, hemoglobin A, 97.4, normal, haptoglobin normal 116    MRI brain 02/05/21 No acute intracranial abnormality.2. Moderate chronic small vessel ischemic disease Past Medical History:  Diagnosis Date   Chronic bilateral low back pain without sciatica 12/26/2020   Deficiency anemia 06/29/2020   Essential hypertension, benign 07/11/2009   Hyperlipidemia    Hyperlipidemia with target LDL less than 130 06/29/2020   The 10-year ASCVD risk score Mikey Bussing DC Jr., et al., 2013) is: 16.3%   Values used to calculate the score:     Age: 16 years     Sex: Female     Is Non-Hispanic African American: Yes     Diabetic: No     Tobacco smoker:  No     Systolic Blood Pressure: 607 mmHg     Is BP treated: Yes     HDL Cholesterol: 53.6 mg/dL     Total Cholesterol: 179 mg/dL   Hypokalemia 06/29/2020   LAE (left atrial enlargement) 06/29/2020   Mild neurocognitive disorder due to Alzheimer's disease, possible 12/26/2020   OAB (overactive bladder) 12/26/2020   Polyp of colon 09/19/2020   Primary osteoarthritis of left shoulder 01/17/2021   Primary osteoarthritis of right shoulder 01/17/2021     Past Surgical History:  Procedure Laterality Date   LAPAROSCOPIC LYSIS OF ADHESIONS  09/08/2017   LAPAROSCOPY N/A 09/08/2017   Procedure: LYSIS OF ADHESIONS LAPAROSCOPIC;  Surgeon: Mickeal Skinner, MD;  Location: [facility];  Service: General;  Laterality: N/A;   REPLACEMENT TOTAL KNEE Left      PREVIOUS MEDICATIONS:   CURRENT MEDICATIONS:  Outpatient Encounter Medications as of 04/10/2022  Medication Sig   acetaminophen (TYLENOL) 325 MG tablet Take 2 tablets (650 mg total) by mouth every 6 (six) hours as needed for mild pain (or temp > 100).   carvedilol (COREG) 3.125 MG tablet Take 1 tablet (3.125 mg total) by mouth 2 (two) times daily with a meal.   celecoxib (CELEBREX) 50 MG capsule Take 1 capsule (50 mg total) by mouth 2 (two) times daily.   donepezil (ARICEPT) 10 MG tablet Take one tablet at 10 mg daily   levocetirizine (XYZAL) 5 MG tablet Take 1 tablet (5 mg total) by mouth every evening.   mirabegron ER (MYRBETRIQ) 50 MG TB24 tablet Take 1 tablet (50 mg total) by mouth daily.   Omega-3 Fatty Acids (FISH OIL) 1000 MG CAPS Take by mouth. Reported on 12/05/2015   potassium chloride SA (KLOR-CON) 20 MEQ tablet Take 1 tablet (20 mEq total) by mouth daily.   pravastatin (PRAVACHOL) 40 MG tablet Take 1 tablet (40 mg total) by mouth daily.   spironolactone (ALDACTONE) 25 MG tablet Take 1 tablet (25 mg total) by mouth daily.   triamcinolone cream (KENALOG) 0.5 % Apply 1 application. topically 2 (two) times daily.   No facility-administered  encounter medications on file as of 04/10/2022.     Objective:     PHYSICAL EXAMINATION:    VITALS:   Vitals:   04/10/22 0926  BP: (!) 150/90  Pulse: 82  Resp: 18  SpO2: 99%  Weight: 141 lb (64 kg)  Height: '5\' 4"'$  (3.710 m)    GEN:  The  patient appears stated age and is in NAD. HEENT:  Normocephalic, atraumatic.   Neurological examination:  General: NAD, well-groomed, appears stated age. Orientation: The patient is alert. Oriented to person, place and date Cranial nerves: There is good facial symmetry.The speech is fluent and clear. No aphasia or dysarthria. Fund of knowledge is appropriate. Recent memory impaired and remote memory is normal.  Attention and concentration are normal.  Able to name objects and repeat phrases.  Hearing is intact to conversational tone.    Sensation: Sensation is intact to light touch throughout Delayed recall 0/3  Motor: Strength is at least antigravity x4. Tremors: none  DTR's 2/4 in UE/LE      01/06/2021   10:00 AM  Montreal Cognitive Assessment   Visuospatial/ Executive (0/5) 2  Naming (0/3) 2  Attention: Read list of digits (0/2) 1  Attention: Read list of letters (0/1) 0  Attention: Serial 7 subtraction starting at 100 (0/3) 3  Language: Repeat phrase (0/2) 0  Language : Fluency (0/1) 1  Abstraction (0/2) 1  Delayed Recall (0/5) 0  Orientation (0/6) 5  Total 15  Adjusted Score (based on education) 15       04/10/2022    9:00 AM  MMSE - Mini Mental State Exam  Orientation to time 4  Orientation to Place 5  Registration 3  Attention/ Calculation 4  Recall 0  Language- name 2 objects 2  Language- repeat 1  Language- follow 3 step command 3  Language- read & follow direction 1  Write a sentence 1  Copy design 1  Total score 25       Movement examination: Tone: There is normal tone in the UE/LE Abnormal movements:  no tremor.  No myoclonus.  No asterixis.   Coordination:  There is no decremation with RAM's. Normal  finger to nose  Gait and Station: The patient has no difficulty arising out of a deep-seated chair without the use of the hands. The patient's stride length is good.  Gait is cautious and narrow. Uses a R cane for stability  Thank you for allowing [name] the opportunity to participate in the care of this nice patient. Please do not hesitate to contact us for any questions or concerns.   Total time spent on today's visit was 32 minutes dedicated to this patient today, preparing to see patient, examining the patient, ordering tests and/or medications and counseling the patient, documenting clinical information in the EHR or other health record, independently interpreting results and communicating results to the patient/family, discussing treatment and goals, answering patient's questions and coordinating care.  Cc:  [name], MD  Sharene Butters 04/10/2022 9:55 AM

## 2022-04-17 DIAGNOSIS — H40053 Ocular hypertension, bilateral: Secondary | ICD-10-CM | POA: Diagnosis not present

## 2022-06-14 ENCOUNTER — Other Ambulatory Visit: Payer: Self-pay

## 2022-06-14 MED ORDER — DONEPEZIL HCL 10 MG PO TABS: ORAL_TABLET | ORAL | 1 refills | Status: DC

## 2022-08-08 ENCOUNTER — Other Ambulatory Visit: Payer: Self-pay | Admitting: Internal Medicine

## 2022-08-08 DIAGNOSIS — N3281 Overactive bladder: Secondary | ICD-10-CM

## 2022-09-26 ENCOUNTER — Ambulatory Visit (INDEPENDENT_AMBULATORY_CARE_PROVIDER_SITE_OTHER): Payer: Medicare PPO | Admitting: Internal Medicine

## 2022-09-26 ENCOUNTER — Encounter: Payer: Self-pay | Admitting: Internal Medicine

## 2022-09-26 VITALS — BP 142/84 | HR 81 | Temp 98.2°F | Ht 64.0 in | Wt 138.0 lb

## 2022-09-26 DIAGNOSIS — E785 Hyperlipidemia, unspecified: Secondary | ICD-10-CM

## 2022-09-26 DIAGNOSIS — N1831 Chronic kidney disease, stage 3a: Secondary | ICD-10-CM | POA: Diagnosis not present

## 2022-09-26 DIAGNOSIS — I10 Essential (primary) hypertension: Secondary | ICD-10-CM | POA: Diagnosis not present

## 2022-09-26 DIAGNOSIS — F5104 Psychophysiologic insomnia: Secondary | ICD-10-CM

## 2022-09-26 DIAGNOSIS — E876 Hypokalemia: Secondary | ICD-10-CM | POA: Diagnosis not present

## 2022-09-26 HISTORY — DX: Chronic kidney disease, stage 3a: N18.31

## 2022-09-26 HISTORY — DX: Psychophysiologic insomnia: F51.04

## 2022-09-26 LAB — CBC WITH DIFFERENTIAL/PLATELET
Basophils Absolute: 0 10*3/uL (ref 0.0–0.1)
Basophils Relative: 0.6 % (ref 0.0–3.0)
Eosinophils Absolute: 0 10*3/uL (ref 0.0–0.7)
Eosinophils Relative: 1.2 % (ref 0.0–5.0)
HCT: 38.7 % (ref 36.0–46.0)
Hemoglobin: 12.9 g/dL (ref 12.0–15.0)
Lymphocytes Relative: 36.4 % (ref 12.0–46.0)
Lymphs Abs: 1.3 10*3/uL (ref 0.7–4.0)
MCHC: 33.4 g/dL (ref 30.0–36.0)
MCV: 93 fl (ref 78.0–100.0)
Monocytes Absolute: 0.3 10*3/uL (ref 0.1–1.0)
Monocytes Relative: 9 % (ref 3.0–12.0)
Neutro Abs: 1.9 10*3/uL (ref 1.4–7.7)
Neutrophils Relative %: 52.8 % (ref 43.0–77.0)
Platelets: 172 10*3/uL (ref 150.0–400.0)
RBC: 4.16 Mil/uL (ref 3.87–5.11)
RDW: 14.9 % (ref 11.5–15.5)
WBC: 3.7 10*3/uL — ABNORMAL LOW (ref 4.0–10.5)

## 2022-09-26 LAB — BASIC METABOLIC PANEL
BUN: 18 mg/dL (ref 6–23)
CO2: 35 mEq/L — ABNORMAL HIGH (ref 19–32)
Calcium: 10.1 mg/dL (ref 8.4–10.5)
Chloride: 100 mEq/L (ref 96–112)
Creatinine, Ser: 0.86 mg/dL (ref 0.40–1.20)
GFR: 65.58 mL/min (ref >60.00)
Glucose, Bld: 116 mg/dL — ABNORMAL HIGH (ref 70–99)
Potassium: 3.5 mEq/L (ref 3.5–5.1)
Sodium: 142 mEq/L (ref 135–145)

## 2022-09-26 MED ORDER — ESZOPICLONE 2 MG PO TABS: 2.0000 mg | ORAL_TABLET | Freq: Every evening | ORAL | 1 refills | Status: AC | PRN

## 2022-09-26 NOTE — Progress Notes (Unsigned)
Subjective:  Patient ID: [name], female    DOB: 11-02-1945  Age: 77 y.o. MRN: [id number]  CC: Hypertension   HPI [name] presents for f/up --  She walks about 45 minutes every other day.  Her endurance is good.  She denies chest pain, shortness of breath, diaphoresis, or edema.  She is controlling her pain with Tylenol.  Outpatient Medications Prior to Visit  Medication Sig Dispense Refill   acetaminophen (TYLENOL) 325 MG tablet Take 2 tablets (650 mg total) by mouth every 6 (six) hours as needed for mild pain (or temp > 100).     donepezil (ARICEPT) 10 MG tablet Take one tablet at 10 mg daily 90 tablet 1   levocetirizine (XYZAL) 5 MG tablet Take 1 tablet (5 mg total) by mouth every evening. 90 tablet 1   MYRBETRIQ 50 MG TB24 tablet TAKE 1 TABLET BY MOUTH DAILY 90 tablet 1   Omega-3 Fatty Acids (FISH OIL) 1000 MG CAPS Take by mouth. Reported on 12/05/2015     triamcinolone cream (KENALOG) 0.5 % Apply 1 application. topically 2 (two) times daily. 60 g 2   carvedilol (COREG) 3.125 MG tablet Take 1 tablet (3.125 mg total) by mouth 2 (two) times daily with a meal. 180 tablet 1   celecoxib (CELEBREX) 50 MG capsule Take 1 capsule (50 mg total) by mouth 2 (two) times daily. 180 capsule 1   potassium chloride SA (KLOR-CON) 20 MEQ tablet Take 1 tablet (20 mEq total) by mouth daily. 90 tablet 0   pravastatin (PRAVACHOL) 40 MG tablet Take 1 tablet (40 mg total) by mouth daily. 90 tablet 1   spironolactone (ALDACTONE) 25 MG tablet Take 1 tablet (25 mg total) by mouth daily. 90 tablet 1   No facility-administered medications prior to visit.    ROS Review of Systems  Constitutional: Negative.  Negative for chills, diaphoresis, fatigue and fever.  HENT: Negative.    Eyes: Negative.   Respiratory:  Negative for cough, chest tightness, shortness of breath and wheezing.   Cardiovascular:  Negative for chest pain, palpitations and leg swelling.  Gastrointestinal:  Negative.  Negative for abdominal pain, constipation, diarrhea, nausea and vomiting.  Genitourinary: Negative.  Negative for difficulty urinating.  Musculoskeletal:  Positive for arthralgias. Negative for back pain, joint swelling and myalgias.  Skin: Negative.   Neurological:  Negative for dizziness, weakness and headaches.  Hematological:  Negative for adenopathy. Does not bruise/bleed easily.  Psychiatric/Behavioral:  Positive for confusion, decreased concentration and sleep disturbance. Negative for agitation, behavioral problems and suicidal ideas. The patient is not nervous/anxious and is not hyperactive.     Objective:  BP (!) 142/84 (BP Location: Left Arm, Patient Position: Sitting, Cuff Size: Large)   Pulse 81   Temp 98.2 F (36.8 C) (Oral)   Ht 5\' 4"  (1.626 m)   Wt 138 lb (62.6 kg)   SpO2 98%   BMI 23.69 kg/m   BP Readings from Last 3 Encounters:  09/26/22 (!) 142/84  04/10/22 (!) 150/90  03/28/22 138/86    Wt Readings from Last 3 Encounters:  09/26/22 138 lb (62.6 kg)  04/10/22 141 lb (64 kg)  03/28/22 139 lb (63 kg)    Physical Exam Vitals reviewed.  Constitutional:      Appearance: She is not ill-appearing.  HENT:     Nose: Nose normal.     Mouth/Throat:     Mouth: Mucous membranes are moist.  Eyes:     General: No scleral  icterus.    Conjunctiva/sclera: Conjunctivae normal.  Cardiovascular:     Rate and Rhythm: Normal rate and regular rhythm.     Heart sounds: No murmur heard. Pulmonary:     Effort: Pulmonary effort is normal.     Breath sounds: No stridor. No wheezing, rhonchi or rales.  Abdominal:     General: Abdomen is flat.     Palpations: There is no mass.     Tenderness: There is no abdominal tenderness. There is no guarding or rebound.     Hernia: No hernia is present.  Musculoskeletal:        General: Normal range of motion.     Cervical back: Neck supple.     Right lower leg: No edema.     Left lower leg: No edema.  Lymphadenopathy:      Cervical: No cervical adenopathy.  Skin:    General: Skin is warm and dry.  Neurological:     General: No focal deficit present.     Mental Status: She is alert. Mental status is at baseline.  Psychiatric:        Attention and Perception: She is inattentive.        Mood and Affect: Mood and affect normal.        Speech: Speech is delayed and tangential.        Behavior: Behavior normal. Behavior is cooperative.        Thought Content: Thought content normal. Thought content is not paranoid or delusional. Thought content does not include homicidal or suicidal ideation.        Cognition and Memory: Cognition is impaired. Memory is impaired. She exhibits impaired recent memory and impaired remote memory.     Lab Results  Component Value Date   WBC 3.7 (L) 09/26/2022   HGB 12.9 09/26/2022   HCT 38.7 09/26/2022   PLT 172.0 09/26/2022   GLUCOSE 116 (H) 09/26/2022   CHOL 233 (H) 02/08/2022   TRIG 133.0 02/08/2022   HDL 57.60 02/08/2022   LDLCALC 149 (H) 02/08/2022   ALT 15 02/08/2022   AST 24 02/08/2022   NA 142 09/26/2022   K 3.5 09/26/2022   CL 100 09/26/2022   CREATININE 0.86 09/26/2022   BUN 18 09/26/2022   CO2 35 (H) 09/26/2022   TSH 2.33 02/08/2022   INR 1.6 (H) 11/01/2006   HGBA1C 5.9 08/13/2019    MM 3D SCREEN BREAST BILATERAL  Result Date: 04/04/2022 CLINICAL DATA:  Screening. EXAM: DIGITAL SCREENING BILATERAL MAMMOGRAM WITH TOMOSYNTHESIS AND CAD TECHNIQUE: Bilateral screening digital craniocaudal and mediolateral oblique mammograms were obtained. Bilateral screening digital breast tomosynthesis was performed. The images were evaluated with computer-aided detection. COMPARISON:  Previous exam(s). ACR Breast Density Category b: There are scattered areas of fibroglandular density. FINDINGS: There are no findings suspicious for malignancy. IMPRESSION: No mammographic evidence of malignancy. A result letter of this screening mammogram will be mailed directly to the  patient. RECOMMENDATION: Screening mammogram in one year. (Code:SM-B-01Y) BI-RADS CATEGORY  1: Negative. Electronically Signed   By: Gerome Sam III M.D.   On: 04/04/2022 15:49    Assessment & Plan:   Essential hypertension, benign- Her blood pressure is not adequately well-controlled.  Will continue the current dose of carvedilol.  Her potassium level is in the low normal range so will restart spironolactone. -     CBC with Differential/Platelet; Future -     Basic metabolic panel; Future -     Carvedilol; Take 1 tablet (3.125 mg total)  by mouth 2 (two) times daily with a meal.  Dispense: 180 tablet; Refill: 1 -     Spironolactone; Take 1 tablet (25 mg total) by mouth daily.  Dispense: 90 tablet; Refill: 1  Psychophysiological insomnia -     Eszopiclone; Take 1 tablet (2 mg total) by mouth at bedtime as needed for sleep. Take immediately before bedtime  Dispense: 90 tablet; Refill: 1  Stage 3a chronic kidney disease (HCC)- I have asked her to avoid nephrotoxic agents.  Hyperlipidemia with target LDL less than 130 -     Pravastatin Sodium; Take 1 tablet (40 mg total) by mouth daily.  Dispense: 90 tablet; Refill: 1  Hypokalemia -     Spironolactone; Take 1 tablet (25 mg total) by mouth daily.  Dispense: 90 tablet; Refill: 1     Follow-up: Return in about 6 months (around 03/29/2023).  Sanda Linger, MD

## 2022-09-26 NOTE — Patient Instructions (Signed)
Insomnia Insomnia is a sleep disorder that makes it difficult to fall asleep or stay asleep. Insomnia can cause fatigue, low energy, difficulty concentrating, mood swings, and poor performance at work or school. There are three different ways to classify insomnia: Difficulty falling asleep. Difficulty staying asleep. Waking up too early in the morning. Any type of insomnia can be long-term (chronic) or short-term (acute). Both are common. Short-term insomnia usually lasts for 3 months or less. Chronic insomnia occurs at least three times a week for longer than 3 months. What are the causes? Insomnia may be caused by another condition, situation, or substance, such as: Having certain mental health conditions, such as anxiety and depression. Using caffeine, alcohol, tobacco, or drugs. Having gastrointestinal conditions, such as gastroesophageal reflux disease (GERD). Having certain medical conditions. These include: Asthma. Alzheimer's disease. Stroke. Chronic pain. An overactive thyroid gland (hyperthyroidism). Other sleep disorders, such as restless legs syndrome and sleep apnea. Menopause. Sometimes, the cause of insomnia may not be known. What increases the risk? Risk factors for insomnia include: Gender. Females are affected more often than males. Age. Insomnia is more common as people get older. Stress and certain medical and mental health conditions. Lack of exercise. Having an irregular work schedule. This may include working night shifts and traveling between different time zones. What are the signs or symptoms? If you have insomnia, the main symptom is having trouble falling asleep or having trouble staying asleep. This may lead to other symptoms, such as: Feeling tired or having low energy. Feeling nervous about going to sleep. Not feeling rested in the morning. Having trouble concentrating. Feeling irritable, anxious, or depressed. How is this diagnosed? This condition  may be diagnosed based on: Your symptoms and medical history. Your health care provider may ask about: Your sleep habits. Any medical conditions you have. Your mental health. A physical exam. How is this treated? Treatment for insomnia depends on the cause. Treatment may focus on treating an underlying condition that is causing the insomnia. Treatment may also include: Medicines to help you sleep. Counseling or therapy. Lifestyle adjustments to help you sleep better. Follow these instructions at home: Eating and drinking  Limit or avoid alcohol, caffeinated beverages, and products that contain nicotine and tobacco, especially close to bedtime. These can disrupt your sleep. Do not eat a large meal or eat spicy foods right before bedtime. This can lead to digestive discomfort that can make it hard for you to sleep. Sleep habits  Keep a sleep diary to help you and your health care provider figure out what could be causing your insomnia. Write down: When you sleep. When you wake up during the night. How well you sleep and how rested you feel the next day. Any side effects of medicines you are taking. What you eat and drink. Make your bedroom a dark, comfortable place where it is easy to fall asleep. Put up shades or blackout curtains to block light from outside. Use a white noise machine to block noise. Keep the temperature cool. Limit screen use before bedtime. This includes: Not watching TV. Not using your smartphone, tablet, or computer. Stick to a routine that includes going to bed and waking up at the same times every day and night. This can help you fall asleep faster. Consider making a quiet activity, such as reading, part of your nighttime routine. Try to avoid taking naps during the day so that you sleep better at night. Get out of bed if you are still awake after   15 minutes of trying to sleep. Keep the lights down, but try reading or doing a quiet activity. When you feel  sleepy, go back to bed. General instructions Take over-the-counter and prescription medicines only as told by your health care provider. Exercise regularly as told by your health care provider. However, avoid exercising in the hours right before bedtime. Use relaxation techniques to manage stress. Ask your health care provider to suggest some techniques that may work well for you. These may include: Breathing exercises. Routines to release muscle tension. Visualizing peaceful scenes. Make sure that you drive carefully. Do not drive if you feel very sleepy. Keep all follow-up visits. This is important. Contact a health care provider if: You are tired throughout the day. You have trouble in your daily routine due to sleepiness. You continue to have sleep problems, or your sleep problems get worse. Get help right away if: You have thoughts about hurting yourself or someone else. Get help right away if you feel like you may hurt yourself or others, or have thoughts about taking your own life. Go to your nearest emergency room or: Call 911. Call the National Suicide Prevention Lifeline at 1-800-273-8255 or 988. This is open 24 hours a day. Text the Crisis Text Line at 741741. Summary Insomnia is a sleep disorder that makes it difficult to fall asleep or stay asleep. Insomnia can be long-term (chronic) or short-term (acute). Treatment for insomnia depends on the cause. Treatment may focus on treating an underlying condition that is causing the insomnia. Keep a sleep diary to help you and your health care provider figure out what could be causing your insomnia. This information is not intended to replace advice given to you by your health care provider. Make sure you discuss any questions you have with your health care provider. Document Revised: 04/17/2021 Document Reviewed: 04/17/2021 Elsevier Patient Education  2023 Else[employer] Inc[employer]  

## 2022-09-27 MED ORDER — CARVEDILOL 3.125 MG PO TABS: 3.1250 mg | ORAL_TABLET | Freq: Two times a day (BID) | ORAL | 1 refills | Status: DC

## 2022-09-27 MED ORDER — PRAVASTATIN SODIUM 40 MG PO TABS: 40.0000 mg | ORAL_TABLET | Freq: Every day | ORAL | 1 refills | Status: DC

## 2022-09-27 MED ORDER — SPIRONOLACTONE 25 MG PO TABS: 25.0000 mg | ORAL_TABLET | Freq: Every day | ORAL | 1 refills | Status: DC

## 2022-10-17 ENCOUNTER — Encounter: Payer: Self-pay | Admitting: Physician Assistant

## 2022-10-17 ENCOUNTER — Ambulatory Visit (INDEPENDENT_AMBULATORY_CARE_PROVIDER_SITE_OTHER): Payer: Medicare PPO | Admitting: Physician Assistant

## 2022-10-17 VITALS — BP 130/86 | HR 74 | Resp 18 | Ht 64.0 in | Wt 141.0 lb

## 2022-10-17 DIAGNOSIS — F0670 Mild neurocognitive disorder due to known physiological condition without behavioral disturbance: Secondary | ICD-10-CM

## 2022-10-17 DIAGNOSIS — G309 Alzheimer's disease, unspecified: Secondary | ICD-10-CM

## 2022-10-17 NOTE — Patient Instructions (Addendum)
It was a pleasure to see you today at our office.   Recommendations:  Follow up in  6 months Continue donepezil 10 mg daily. Side effects were discussed  Repeat Neuropsychological testing for clarity of diagnosis . Have your daughter at the time of the results in person or on the phone Oct 25   Whom to call:  Memory  decline, memory medications: Call our office [phone]   For psychiatric meds, mood meds: Please have your primary care physician manage these medications.    For assessment of decision of mental capacity and competency:  Call Dr. Erick Blinks, geriatric psychiatrist at [phone]  For guidance in geriatric dementia issues please call [facility] [phone]     If you have any severe symptoms of a stroke, or other severe issues such as confusion,severe chills or fever, etc call 911 or go to the ER as you may need to be evaluated further     RECOMMENDATIONS FOR ALL PATIENTS WITH MEMORY PROBLEMS: 1. Continue to exercise (Recommend 30 minutes of walking everyday, or 3 hours every week) 2. Increase social interactions - continue going to [place] and enjoy social gatherings with friends and family 3. Eat healthy, avoid fried foods and eat more fruits and vegetables 4. Maintain adequate blood pressure, blood sugar, and blood cholesterol level. Reducing the risk of stroke and cardiovascular disease also helps promoting better memory. 5. Avoid stressful situations. Live a simple life and avoid aggravations. Organize your time and prepare for the next day in anticipation. 6. Sleep well, avoid any interruptions of sleep and avoid any distractions in the bedroom that may interfere with adequate sleep quality 7. Avoid sugar, avoid sweets as there is a strong link between excessive sugar intake, diabetes, and cognitive impairment We discussed the Mediterranean diet, which has been shown to help patients reduce the risk of progressive memory disorders and reduces  cardiovascular risk. This includes eating fish, eat fruits and green leafy vegetables, nuts like almonds and hazelnuts, walnuts, and also use olive oil. Avoid fast foods and fried foods as much as possible. Avoid sweets and sugar as sugar use has been linked to worsening of memory function.  There is always a concern of gradual progression of memory problems. If this is the case, then we may need to adjust level of care according to patient needs. Support, both to the patient and caregiver, should then be put into place.    The Alzheimer's Association is here all day, every day for people facing Alzheimer's disease through our free 24/7 Helpline: 605-286-4815. The Helpline provides reliable information and support to all those who need assistance, such as individuals living with memory loss, Alzheimer's or other dementia, caregivers, health care professionals and the public.  Our highly trained and knowledgeable staff can help you with: Understanding memory loss, dementia and Alzheimer's  Medications and other treatment options  General information about aging and brain health  Skills to provide quality care and to find the best care from professionals  Legal, financial and living-arrangement decisions Our Helpline also features: Confidential care consultation provided by master's level clinicians who can help with decision-making support, crisis assistance and education on issues families face every day  Help in a caller's preferred language using our translation service that features more than 200 languages and dialects  Referrals to local community programs, services and ongoing support     FALL PRECAUTIONS: Be cautious when walking. Scan the area for obstacles that may increase the risk of trips  and falls. When getting up in the mornings, sit up at the edge of the bed for a few minutes before getting out of bed. Consider elevating the bed at the head end to avoid drop of blood pressure when  getting up. Walk always in a well-lit room (use night lights in the walls). Avoid area rugs or power cords from appliances in the middle of the walkways. Use a walker or a cane if necessary and consider physical therapy for balance exercise. Get your eyesight checked regularly.  FINANCIAL OVERSIGHT: Supervision, especially oversight when making financial decisions or transactions is also recommended.  HOME SAFETY: Consider the safety of the kitchen when operating appliances like stoves, microwave oven, and blender. Consider having supervision and share cooking responsibilities until no longer able to participate in those. Accidents with firearms and other hazards in the house should be identified and addressed as well.   ABILITY TO BE LEFT ALONE: If patient is unable to contact 911 operator, consider using LifeLine, or when the need is there, arrange for someone to stay with patients. Smoking is a fire hazard, consider supervision or cessation. Risk of wandering should be assessed by caregiver and if detected at any point, supervision and safe proof recommendations should be instituted.  MEDICATION SUPERVISION: Inability to self-administer medication needs to be constantly addressed. Implement a mechanism to ensure safe administration of the medications.   DRIVING: Regarding driving, in patients with progressive memory problems, driving will be impaired. We advise to have someone else do the driving if trouble finding directions or if minor accidents are reported. Independent driving assessment is available to determine safety of driving.   If you are interested in the driving assessment, you can contact the following:  [employer] in [place] [phone]  [facility] [phone]  [facility] [phone] or [phone]      Mediterranean Diet A Mediterranean diet refers to food and lifestyle choices that are  based on the traditions of countries located on the [employer]. This way of eating has been shown to help prevent certain conditions and improve outcomes for people who have chronic diseases, like kidney disease and heart disease. What are tips for following this plan? Lifestyle  Cook and eat meals together with your family, when possible. Drink enough fluid to keep your urine clear or pale yellow. Be physically active every day. This includes: Aerobic exercise like running or swimming. Leisure activities like gardening, walking, or housework. Get 7-8 hours of sleep each night. If recommended by your health care provider, drink red wine in moderation. This means 1 glass a day for nonpregnant women and 2 glasses a day for men. A glass of wine equals 5 oz (150 mL). Reading food labels  Check the serving size of packaged foods. For foods such as rice and pasta, the serving size refers to the amount of cooked product, not dry. Check the total fat in packaged foods. Avoid foods that have saturated fat or trans fats. Check the ingredients list for added sugars, such as corn syrup. Shopping  At the grocery store, buy most of your food from the areas near the walls of the store. This includes: Fresh fruits and vegetables (produce). Grains, beans, nuts, and seeds. Some of these may be available in unpackaged forms or large amounts (in bulk). Fresh seafood. Poultry and eggs. Low-fat dairy products. Buy whole ingredients instead of prepackaged foods. Buy fresh fruits and vegetables in-season from local farmers markets. Buy  frozen fruits and vegetables in resealable bags. If you do not have access to quality fresh seafood, buy precooked frozen shrimp or canned fish, such as tuna, salmon, or sardines. Buy small amounts of raw or cooked vegetables, salads, or olives from the deli or salad bar at your store. Stock your pantry so you always have certain foods on hand, such as olive oil, canned tuna,  canned tomatoes, rice, pasta, and beans. Cooking  Cook foods with extra-virgin olive oil instead of using butter or other vegetable oils. Have meat as a side dish, and have vegetables or grains as your main dish. This means having meat in small portions or adding small amounts of meat to foods like pasta or stew. Use beans or vegetables instead of meat in common dishes like chili or lasagna. Experiment with different cooking methods. Try roasting or broiling vegetables instead of steaming or sauteing them. Add frozen vegetables to soups, stews, pasta, or rice. Add nuts or seeds for added healthy fat at each meal. You can add these to yogurt, salads, or vegetable dishes. Marinate fish or vegetables using olive oil, lemon juice, garlic, and fresh herbs. Meal planning  Plan to eat 1 vegetarian meal one day each week. Try to work up to 2 vegetarian meals, if possible. Eat seafood 2 or more times a week. Have healthy snacks readily available, such as: Vegetable sticks with hummus. Greek yogurt. Fruit and nut trail mix. Eat balanced meals throughout the week. This includes: Fruit: 2-3 servings a day Vegetables: 4-5 servings a day Low-fat dairy: 2 servings a day Fish, poultry, or lean meat: 1 serving a day Beans and legumes: 2 or more servings a week Nuts and seeds: 1-2 servings a day Whole grains: 6-8 servings a day Extra-virgin olive oil: 3-4 servings a day Limit red meat and sweets to only a few servings a month What are my food choices? Mediterranean diet Recommended Grains: Whole-grain pasta. Brown rice. Bulgar wheat. Polenta. Couscous. Whole-wheat bread. Orpah Cobb. Vegetables: Artichokes. Beets. Broccoli. Cabbage. Carrots. Eggplant. Green beans. Chard. Kale. Spinach. Onions. Leeks. Peas. Squash. Tomatoes. Peppers. Radishes. Fruits: Apples. Apricots. Avocado. Berries. Bananas. Cherries. Dates. Figs. Grapes. Lemons. Melon. Oranges. Peaches. Plums. Pomegranate. Meats and other  protein foods: Beans. Almonds. Sunflower seeds. Pine nuts. Peanuts. Cod. Salmon. Scallops. Shrimp. Tuna. Tilapia. Clams. Oysters. Eggs. Dairy: Low-fat milk. Cheese. Greek yogurt. Beverages: Water. Red wine. Herbal tea. Fats and oils: Extra virgin olive oil. Avocado oil. Grape seed oil. Sweets and desserts: [place] yogurt with honey. Baked apples. Poached pears. Trail mix. Seasoning and other foods: Basil. Cilantro. Coriander. Cumin. Mint. Parsley. Sage. Rosemary. Tarragon. Garlic. Oregano. Thyme. Pepper. Balsalmic vinegar. Tahini. Hummus. Tomato sauce. Olives. Mushrooms. Limit these Grains: Prepackaged pasta or rice dishes. Prepackaged cereal with added sugar. Vegetables: Deep fried potatoes (french fries). Fruits: Fruit canned in syrup. Meats and other protein foods: Beef. Pork. Lamb. Poultry with skin. Hot dogs. Tomasa Blase. Dairy: Ice cream. Sour cream. Whole milk. Beverages: Juice. Sugar-sweetened soft drinks. Beer. Liquor and spirits. Fats and oils: Butter. Canola oil. Vegetable oil. Beef fat (tallow). Lard. Sweets and desserts: Cookies. Cakes. Pies. Candy. Seasoning and other foods: Mayonnaise. Premade sauces and marinades. The items listed may not be a complete list. Talk with your dietitian about what dietary choices are right for you. Summary The Mediterranean diet includes both food and lifestyle choices. Eat a variety of fresh fruits and vegetables, beans, nuts, seeds, and whole grains. Limit the amount of red meat and sweets that you eat. Talk with  your health care provider about whether it is safe for you to drink red wine in moderation. This means 1 glass a day for nonpregnant women and 2 glasses a day for men. A glass of wine equals 5 oz (150 mL). This information is not intended to replace advice given to you by your health care provider. Make sure you discuss any questions you have with your health care provider. Document Released: 12/29/2015 Document Revised: 01/31/2016 Document  Reviewed: 12/29/2015 Elsevier Interactive Patient Education  2017 [employer].

## 2022-10-17 NOTE — Progress Notes (Signed)
Assessment/Plan:    Mild neurocognitive disorder likely due to Alzheimer's disease  [name] is a very pleasant 77 y.o. RH female with a history of hypertension, hyperlipidemia, chronic pain due to severe osteoarthritis and mild neurocognitive disorder likely due to Alzheimer's disease as per  Neurocognitive testing presenting today in follow-up for evaluation of memory loss. Patient is on donepezil 10 mg daily.  Patient is able to participate in her ADLs and drives independently.  MMSE today is 26/30. Memory is stable.   Recommendations:   Follow up in 6 months. Continue donepezil 10 mg daily. Side effects were discussed  Recommend good control of cardiovascular risk factors Patient is scheduled for repeat Neuropsych evaluation for clarity of the diagnosis and disease trajectory October 2024. Continue to control mood as per PCP    Subjective:   This patient is  here alone. Previous records as well as any outside records available were reviewed prior to todays visit.   Patient was last seen on 04/10/22 with MMSE 25/30       Any changes in memory since last visit?. "Life is good". She continues to plan ahead activities, making a list to stay organized. She enjoys going to the [facility] , word finding, coloring books, making words than another stem word, reading Bible and testing herself on it.  She enjoys going to church, [employer]. repeats oneself?  Endorsed, but "try to be careful and catch myself ahead" Disoriented when walking into a room?  Patient denies   Leaving objects in unusual places?  Patient denies   Wandering behavior?   denies   Any personality changes since last visit?   denies   Any worsening depression?: denies   Hallucinations or paranoia?  denies   Seizures?   denies    Any sleep changes?  Does not sleep well, take a sleeping pills with good results.  Denies  vivid dreams, REM behavior or sleepwalking   Sleep apnea?   denies    Any hygiene concerns?   denies   Independent of bathing and dressing?  Endorsed  Does the patient needs help with medications?  Patient is in charge   Who is in charge of the finances?  Patient is in charge     Any changes in appetite?   She eats healthy, she tries to push more water.    Patient have trouble swallowing?  denies   Does the patient cook?  Any kitchen accidents such as leaving the stove on?   denies   Any headaches?    denies   Vision changes? denies Chronic back pain  denies   Ambulates with difficulty?    denies.  She walks frequently about 1 mile a day.    Recent falls or head injuries?    denies     Unilateral weakness, numbness or tingling?   denies   Any tremors?  denies   Any anosmia?    denies   Any incontinence of urine?  "I only wear pads if I go somewhere " Any bowel dysfunction?  denies      Patient lives alone, will move with her daughter in [place] sometime next year.   Does the patient drive?  Endorsed, about the 4 mile radius.      Initial evaluation 02/06/2021 the patient is seen in neurologic consultation at the request of [name] Grandchild, MD for the evaluation of memory.  The patient is here alone.She is   a 77 y.o. year  old female who has had memory issues for about one year.  She states that she cannot recall as well forgetting new information after a few minutes.Her long term memory is good.  During this time, she has been writing "anything I need to remember because otherwise I will not ".  She recently has told her family about these changes, as she was "trying to figure out what was going".  She denies any changes in mood, or history of depression or anxiety.  She denies irritability.  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 She does not sleep well for the last year, as she wakes up several times to urinate between 2-4 times a night.  She denies any vivid dreams or sleepwalking, hallucinations, paranoia, or leaving objects in unusual places.  At times she cannot find her keys to the house or to the car, so she has been keeping them in a necklace.  She is independent of dressing and bathing.  At times, she forgets to take medications, but has not done so lately because she writes it down . She denies forgetting to pay bills.  Her appetite is normal, but she has lost some weight since she is not going to eat out as often as before.  Denies any trouble swallowing. She loves to cook, and denies leaving the stove or the faucet on.  She ambulates without difficulty without the use of a walker or a cane, but she does admit that is hard at times because of the constant arthritic pain.  She suffered a fall in May 31 when the train she was in, coming from [place] to [place] made a shortstop, and she hurt her knee, but did not lose consciousness, or hit her head.  She never had a concussion or seizures. She continues to drive, denies getting lost, unless she takes an unfamiliar road where she needs the GPS. Denies headaches, double vision, dizziness, focal numbness or tingling, unilateral weakness or tremors, denies constipation or diarrhea.  She has anosmia for the last 2 years, denies having had COVID.  She denies a history of sleep apnea, alcohol, or tobacco.  She denies family history of dementia.   Neurocognitive evaluation 01/25/2021 Dr. Milbert Coulter "results suggested severe impairment surrounding all aspects of learning and memory. Additional impairments were exhibited across confrontation naming and several aspects of executive functioning (namely cognitive flexibility and response inhibition). A relative weakness was further exhibited across semantic fluency and receptive language. Regarding etiology, I  have concerns surrounding the presence of Alzheimer's disease. Across memory tasks, [name] had significant trouble learning new information and did not benefit from repeated exposure. After a delay, she was essentially amnestic across all assessments. Performance across yes/no recognition tasks was mildly variable, but overall below expectation as she benefited from correct guessing at times. Overall, memory performance suggests a strong storage deficit, which is the hallmark characteristic of this disease process. Additionally, impairments in confrontation naming and a noted discrepancy between semantic and phonemic fluency performances (with the former being consistently worse) is also very consistent with expected patterns of dysfunction with this disease process."       Labs 09/19/20 BMP normal CBC normal TSH normal 1.25 Iron 86, normal Ferritin 31.1 on  the lower normal B1: 12 on the lower normal Magnesium 2.2 normal LDH normal 202, reticulocyte's 0.8%, normal, fetal hemoglobin less than 1%, normal, hemoglobin A2 2.6, normal, hemoglobin A, 97.4, normal, haptoglobin normal 116    MRI brain 02/05/21 No acute intracranial abnormality.2. Moderate chronic small vessel ischemic disease  Past Medical History:  Diagnosis Date   Chronic bilateral low back pain without sciatica 12/26/2020   Deficiency anemia 06/29/2020   Essential hypertension, benign 07/11/2009   Hyperlipidemia    Hyperlipidemia with target LDL less than 130 06/29/2020   The 10-year ASCVD risk score Denman George [place] Jr., et al., 2013) is: 16.3%   Values used to calculate the score:     Age: 30 years     Sex: Female     Is Non-Hispanic African American: Yes     Diabetic: No     Tobacco smoker: No     Systolic Blood Pressure: 148 mmHg     Is BP treated: Yes     HDL Cholesterol: 53.6 mg/dL     Total Cholesterol: 179 mg/dL   Hypokalemia 29/52/8413   LAE (left atrial enlargement) 06/29/2020   Mild neurocognitive disorder due to  Alzheimer's disease, possible 12/26/2020   OAB (overactive bladder) 12/26/2020   Polyp of colon 09/19/2020   Primary osteoarthritis of left shoulder 01/17/2021   Primary osteoarthritis of right shoulder 01/17/2021     Past Surgical History:  Procedure Laterality Date   LAPAROSCOPIC LYSIS OF ADHESIONS  09/08/2017   LAPAROSCOPY N/A 09/08/2017   Procedure: LYSIS OF ADHESIONS LAPAROSCOPIC;  Surgeon: Rodman Pickle, MD;  Location: [facility] OR;  Service: General;  Laterality: N/A;   REPLACEMENT TOTAL KNEE Left      PREVIOUS MEDICATIONS:   CURRENT MEDICATIONS:  Outpatient Encounter Medications as of 10/17/2022  Medication Sig   acetaminophen (TYLENOL) 325 MG tablet Take 2 tablets (650 mg total) by mouth every 6 (six) hours as needed for mild pain (or temp > 100).   carvedilol (COREG) 3.125 MG tablet Take 1 tablet (3.125 mg total) by mouth 2 (two) times daily with a meal.   donepezil (ARICEPT) 10 MG tablet Take one tablet at 10 mg daily   eszopiclone (LUNESTA) 2 MG TABS tablet Take 1 tablet (2 mg total) by mouth at bedtime as needed for sleep. Take immediately before bedtime   levocetirizine (XYZAL) 5 MG tablet Take 1 tablet (5 mg total) by mouth every evening.   MYRBETRIQ 50 MG TB24 tablet TAKE 1 TABLET BY MOUTH DAILY   Omega-3 Fatty Acids (FISH OIL) 1000 MG CAPS Take by mouth. Reported on 12/05/2015   pravastatin (PRAVACHOL) 40 MG tablet Take 1 tablet (40 mg total) by mouth daily.   spironolactone (ALDACTONE) 25 MG tablet Take 1 tablet (25 mg total) by mouth daily.   triamcinolone cream (KENALOG) 0.5 % Apply 1 application. topically 2 (two) times daily.   No facility-administered encounter medications on file as of 10/17/2022.     Objective:     PHYSICAL EXAMINATION:    VITALS:   Vitals:   10/17/22 0911  BP: 130/86  Pulse: 74  Resp: 18  SpO2: 98%  Weight: 141 lb (64 kg)  Height: 5\' 4"  (1.626 m)    GEN:  The patient appears stated age and is in NAD. HEENT:  Normocephalic,  atraumatic.   Neurological examination:  General: NAD, well-groomed, appears stated age. Orientation: The patient is alert. Oriented to person, place and date Cranial nerves: There is good facial symmetry. The speech is  fluent and clear. No aphasia or dysarthria. Fund of knowledge is appropriate. Recent memory impaired and remote memory is normal.  Attention and concentration are normal.  Able to name objects and repeat phrases.  Hearing is intact to conversational tone. Delayed recall 1/30 Sensation: Sensation is intact to light touch throughout Motor: Strength is at least antigravity x4. Tremors: none  DTR's 2/4 in UE/LE      01/06/2021   10:00 AM  Montreal Cognitive Assessment   Visuospatial/ Executive (0/5) 2  Naming (0/3) 2  Attention: Read list of digits (0/2) 1  Attention: Read list of letters (0/1) 0  Attention: Serial 7 subtraction starting at 100 (0/3) 3  Language: Repeat phrase (0/2) 0  Language : Fluency (0/1) 1  Abstraction (0/2) 1  Delayed Recall (0/5) 0  Orientation (0/6) 5  Total 15  Adjusted Score (based on education) 15       10/17/2022   12:00 PM 04/10/2022    9:00 AM  MMSE - Mini Mental State Exam  Orientation to time 5 4  Orientation to Place 5 5  Registration 3 3  Attention/ Calculation 4 4  Recall 1 0  Language- name 2 objects 2 2  Language- repeat 1 1  Language- follow 3 step command 3 3  Language- read & follow direction 1 1  Write a sentence 1 1  Copy design 0 1  Total score 26 25       Movement examination: Tone: There is normal tone in the UE/LE Abnormal movements:  no tremor.  No myoclonus.  No asterixis.   Coordination:  There is no decremation with RAM's. Normal finger to nose  Gait and Station: The patient has no difficulty arising out of a deep-seated chair without the use of the hands. The patient's stride length is good.  Gait is cautious and narrow.   Thank you for allowing [name] the opportunity to participate in the care of this  nice patient. Please do not hesitate to contact us for any questions or concerns.   Total time spent on today's visit was 25 minutes dedicated to this patient today, preparing to see patient, examining the patient, ordering tests and/or medications and counseling the patient, documenting clinical information in the EHR or other health record, independently interpreting results and communicating results to the patient/family, discussing treatment and goals, answering patient's questions and coordinating care.  Cc:  [name] Grandchild, MD  Marlowe Kays 10/17/2022 12:47 PM

## 2022-10-19 DIAGNOSIS — H40053 Ocular hypertension, bilateral: Secondary | ICD-10-CM | POA: Diagnosis not present

## 2023-01-21 ENCOUNTER — Other Ambulatory Visit: Payer: Self-pay | Admitting: Internal Medicine

## 2023-01-21 DIAGNOSIS — M159 Polyosteoarthritis, unspecified: Secondary | ICD-10-CM

## 2023-01-23 ENCOUNTER — Encounter: Payer: Medicare PPO | Admitting: Psychology

## 2023-01-30 ENCOUNTER — Encounter: Payer: Medicare PPO | Admitting: Psychology

## 2023-02-14 ENCOUNTER — Ambulatory Visit: Payer: Medicare PPO

## 2023-02-14 VITALS — Ht 64.0 in | Wt 141.0 lb

## 2023-02-14 DIAGNOSIS — Z0000 Encounter for general adult medical examination without abnormal findings: Secondary | ICD-10-CM

## 2023-02-14 NOTE — Progress Notes (Signed)
Subjective:   [name] is a 77 y.o. female who presents for Medicare Annual (Subsequent) preventive examination.  Visit Complete: Virtual  I connected with  [name] on 02/14/23 by a audio enabled telemedicine application and verified that I am speaking with the correct person using two identifiers.  Patient Location: Home  Provider Location: Home Office  I discussed the limitations of evaluation and management by telemedicine. The patient expressed understanding and agreed to proceed.   Because this visit was a virtual/telehealth visit, some criteria may be missing or patient reported. Any vitals not documented were not able to be obtained and vitals that have been documented are patient reported.   Cardiac Risk Factors include: advanced age (>69men, >11 women);hypertension     Objective:    Today's Vitals   02/14/23 0903  Weight: 141 lb (64 kg)  Height: 5\' 4"  (1.626 m)   Body mass index is 24.2 kg/m.     02/14/2023    9:13 AM 10/17/2022    9:15 AM 04/10/2022    9:33 AM 02/08/2022    9:08 AM 08/17/2021    8:42 AM 02/17/2021    7:59 AM 01/06/2021   10:08 AM  Advanced Directives  Does Patient Have a Medical Advance Directive? Yes No No Yes Yes No;Yes Yes  Type of Estate agent of [place];Living will   Healthcare Power of [place];Living will  Living will Healthcare Power of [place];Living will  Copy of Healthcare Power of Attorney in Chart? No - copy requested   No - copy requested     Would patient like information on creating a medical advance directive?  No - Patient declined         Current Medications (verified) Outpatient Encounter Medications as of 02/14/2023  Medication Sig   acetaminophen (TYLENOL) 325 MG tablet Take 2 tablets (650 mg total) by mouth every 6 (six) hours as needed for mild pain (or temp > 100).   carvedilol (COREG) 3.125 MG tablet Take 1 tablet (3.125 mg total) by mouth 2 (two) times daily with a  meal.   donepezil (ARICEPT) 10 MG tablet Take one tablet at 10 mg daily   eszopiclone (LUNESTA) 2 MG TABS tablet Take 1 tablet (2 mg total) by mouth at bedtime as needed for sleep. Take immediately before bedtime   levocetirizine (XYZAL) 5 MG tablet Take 1 tablet (5 mg total) by mouth every evening.   MYRBETRIQ 50 MG TB24 tablet TAKE 1 TABLET BY MOUTH DAILY   Omega-3 Fatty Acids (FISH OIL) 1000 MG CAPS Take by mouth. Reported on 12/05/2015   pravastatin (PRAVACHOL) 40 MG tablet Take 1 tablet (40 mg total) by mouth daily.   spironolactone (ALDACTONE) 25 MG tablet Take 1 tablet (25 mg total) by mouth daily.   triamcinolone cream (KENALOG) 0.5 % Apply 1 application. topically 2 (two) times daily.   No facility-administered encounter medications on file as of 02/14/2023.    Allergies (verified) Patient has no known allergies.   History: Past Medical History:  Diagnosis Date   Chronic bilateral low back pain without sciatica 12/26/2020   Deficiency anemia 06/29/2020   Essential hypertension, benign 07/11/2009   Hyperlipidemia    Hyperlipidemia with target LDL less than 130 06/29/2020   The 10-year ASCVD risk score Denman George DC Jr., et al., 2013) is: 16.3%   Values used to calculate the score:     Age: 28 years     Sex: Female     Is Non-Hispanic African  American: Yes     Diabetic: No     Tobacco smoker: No     Systolic Blood Pressure: 148 mmHg     Is BP treated: Yes     HDL Cholesterol: 53.6 mg/dL     Total Cholesterol: 179 mg/dL   Hypokalemia 82/95/6213   LAE (left atrial enlargement) 06/29/2020   Mild neurocognitive disorder due to Alzheimer's disease, possible 12/26/2020   OAB (overactive bladder) 12/26/2020   Polyp of colon 09/19/2020   Primary osteoarthritis of left shoulder 01/17/2021   Primary osteoarthritis of right shoulder 01/17/2021   Past Surgical History:  Procedure Laterality Date   LAPAROSCOPIC LYSIS OF ADHESIONS  09/08/2017   LAPAROSCOPY N/A 09/08/2017   Procedure: LYSIS OF  ADHESIONS LAPAROSCOPIC;  Surgeon: [name] Hatch De Blanch, MD;  Location: [facility] OR;  Service: General;  Laterality: N/A;   REPLACEMENT TOTAL KNEE Left    Family History  Problem Relation Age of Onset   Cancer Mother        STOMACH   Heart disease Father    Diabetes Sister    Diabetes Brother    Breast cancer Neg Hx    Social History   Socioeconomic History   Marital status: Divorced    Spouse name: Not on file   Number of children: Not on file   Years of education: 16   Highest education level: Bachelor's degree (e.g., BA, AB, BS)  Occupational History   Occupation: Retired    Comment: Psychologist, forensic  Tobacco Use   Smoking status: Never   Smokeless tobacco: Never  Vaping Use   Vaping status: Never Used  Substance and Sexual Activity   Alcohol use: No    Alcohol/week: 0.0 standard drinks of alcohol    Comment: occasional   Drug use: No   Sexual activity: Not Currently    Birth control/protection: None  Other Topics Concern   Not on file  Social History Narrative   Right handed    Lives alone    2 story home   Drinks caffeine prn   Social Determinants of Health   Financial Resource Strain: Low Risk  (02/14/2023)   Overall Financial Resource Strain (CARDIA)    Difficulty of Paying Living Expenses: Not hard at all  Food Insecurity: No Food Insecurity (02/14/2023)   Hunger Vital Sign    Worried About Running Out of Food in the Last Year: Never true    Ran Out of Food in the Last Year: Never true  Transportation Needs: No Transportation Needs (02/14/2023)   PRAPARE - Administrator, Civil Service (Medical): No    Lack of Transportation (Non-Medical): No  Physical Activity: Sufficiently Active (02/14/2023)   Exercise Vital Sign    Days of Exercise per Week: 5 days    Minutes of Exercise per Session: 50 min  Stress: No Stress Concern Present (02/14/2023)   [employer] of Occupational Health - Occupational Stress Questionnaire    Feeling of Stress : Not at  all  Social Connections: Moderately Integrated (02/14/2023)   Social Connection and Isolation Panel [NHANES]    Frequency of Communication with Friends and Family: More than three times a week    Frequency of Social Gatherings with Friends and Family: More than three times a week    Attends Religious Services: More than 4 times per year    Active Member of [employer] or Organizations: Yes    Attends Banker Meetings: More than 4 times per year    Marital Status:  Divorced    Tobacco Counseling Counseling given: Not Answered   Clinical Intake:  Pre-visit preparation completed: Yes  Pain : No/denies pain     BMI - recorded: 24.2 Nutritional Status: BMI of 19-24  Normal Nutritional Risks: None Diabetes: No  How often do you need to have someone help you when you read instructions, pamphlets, or other written materials from your doctor or pharmacy?: 1 - Never  Interpreter Needed?: No  Information entered by :: Theresa Mulligan LPN   Activities of Daily Living    02/14/2023    9:11 AM  In your present state of health, do you have any difficulty performing the following activities:  Hearing? 0  Vision? 0  Difficulty concentrating or making decisions? 0  Walking or climbing stairs? 0  Dressing or bathing? 0  Doing errands, shopping? 0  Preparing Food and eating ? N  Using the Toilet? N  In the past six months, have you accidently leaked urine? N  Do you have problems with loss of bowel control? N  Managing your Medications? N  Managing your Finances? N  Housekeeping or managing your Housekeeping? N    Patient Care Team: [name], MD as PCP - General (Internal Medicine) Jake Bathe, MD as PCP - Cardiology (Cardiology) Elwyn Reach (Neurology)  Indicate any recent Medical Services you may have received from other than [facility] providers in the past year (date may be approximate).     Assessment:   This is a routine wellness examination for  [name].  Hearing/Vision screen Hearing Screening - Comments:: Denies hearing difficulties   Vision Screening - Comments:: Wears rx glasses - up to date with routine eye exams with  Dr Cathey Endow   Goals Addressed               This Visit's Progress     Patient Stated (pt-stated)        Continue to increase my water intake and watch my diet.       Depression Screen    02/14/2023    9:11 AM 02/08/2022    9:07 AM 02/08/2022    8:21 AM 12/26/2020   10:21 AM 09/19/2020    9:11 AM 06/29/2020    9:47 AM 08/13/2019    8:43 AM  PHQ 2/9 Scores  PHQ - 2 Score 0 0 0 0 0 0 0  PHQ- 9 Score  0 0    0    Fall Risk    02/14/2023    9:12 AM 10/17/2022    9:15 AM 04/10/2022    9:33 AM 02/08/2022    9:09 AM 08/17/2021    8:41 AM  Fall Risk   Falls in the past year? 0 0 0 0 0  Number falls in past yr: 0 0 0 0 0  Injury with Fall? 0 0 0 0 0  Risk for fall due to : No Fall Risks   No Fall Risks   Follow up Falls prevention discussed Falls evaluation completed Falls evaluation completed Falls prevention discussed     MEDICARE RISK AT HOME: Medicare Risk at Home Any stairs in or around the home?: Yes If so, are there any without handrails?: No Home free of loose throw rugs in walkways, pet beds, electrical cords, etc?: Yes Adequate lighting in your home to reduce risk of falls?: Yes Life alert?: No Use of a cane, walker or w/c?: No Grab bars in the bathroom?: Yes Shower chair or bench in shower?:  Yes Elevated toilet seat or a handicapped toilet?: No  TIMED UP AND GO:  Was the test performed?  No    Cognitive Function:    10/17/2022   12:00 PM 04/10/2022    9:00 AM  MMSE - Mini Mental State Exam  Orientation to time 5 4  Orientation to Place 5 5  Registration 3 3  Attention/ Calculation 4 4  Recall 1 0  Language- name 2 objects 2 2  Language- repeat 1 1  Language- follow 3 step command 3 3  Language- read & follow direction 1 1  Write a sentence 1 1  Copy design 0 1  Total score  26 25      01/06/2021   10:00 AM  Montreal Cognitive Assessment   Visuospatial/ Executive (0/5) 2  Naming (0/3) 2  Attention: Read list of digits (0/2) 1  Attention: Read list of letters (0/1) 0  Attention: Serial 7 subtraction starting at 100 (0/3) 3  Language: Repeat phrase (0/2) 0  Language : Fluency (0/1) 1  Abstraction (0/2) 1  Delayed Recall (0/5) 0  Orientation (0/6) 5  Total 15  Adjusted Score (based on education) 15      02/14/2023    9:14 AM 02/08/2022    9:09 AM  6CIT Screen  What Year? 0 points 0 points  What month? 0 points 0 points  What time? 0 points 0 points  Count back from 20 0 points 0 points  Months in reverse 0 points 0 points  Repeat phrase 0 points 0 points  Total Score 0 points 0 points    Immunizations Immunization History  Administered Date(s) Administered   Fluad Quad(high Dose 65+) 02/19/2020, 02/08/2022   Influenza, High Dose Seasonal PF 07/31/2018   PFIZER(Purple Top)SARS-COV-2 Vaccination 07/16/2019, 08/11/2019, 02/17/2020, 09/07/2020   Pneumococcal Polysaccharide-23 09/19/2020   Td 05/21/2009   Tdap 12/19/2011   Zoster Recombinant(Shingrix) 07/10/2017    TDAP status: Due, Education has been provided regarding the importance of this vaccine. Advised may receive this vaccine at local pharmacy or Health Dept. Aware to provide a copy of the vaccination record if obtained from local pharmacy or Health Dept. Verbalized acceptance and understanding.  Flu Vaccine status: Due, Education has been provided regarding the importance of this vaccine. Advised may receive this vaccine at local pharmacy or Health Dept. Aware to provide a copy of the vaccination record if obtained from local pharmacy or Health Dept. Verbalized acceptance and understanding.  Pneumococcal vaccine status: Due, Education has been provided regarding the importance of this vaccine. Advised may receive this vaccine at local pharmacy or Health Dept. Aware to provide a copy of the  vaccination record if obtained from local pharmacy or Health Dept. Verbalized acceptance and understanding.    Qualifies for Shingles Vaccine? Yes   Zostavax completed No   Shingrix Completed?: No.    Education has been provided regarding the importance of this vaccine. Patient has been advised to call insurance company to determine out of pocket expense if they have not yet received this vaccine. Advised may also receive vaccine at local pharmacy or Health Dept. Verbalized acceptance and understanding.  Screening Tests Health Maintenance  Topic Date Due   Zoster Vaccines- Shingrix (2 of 2) 09/04/2017   Pneumonia Vaccine 23+ Years old (2 of 2 - PCV) 09/19/2021   DTaP/Tdap/Td (3 - Td or Tdap) 12/18/2021   INFLUENZA VACCINE  12/20/2022   Medicare Annual Wellness (AWV)  02/14/2024   DEXA SCAN  Completed  Hepatitis C Screening  Completed   HPV VACCINES  Aged Out   Colonoscopy  Discontinued   COVID-19 Vaccine  Discontinued    Health Maintenance  Health Maintenance Due  Topic Date Due   Zoster Vaccines- Shingrix (2 of 2) 09/04/2017   Pneumonia Vaccine 110+ Years old (2 of 2 - PCV) 09/19/2021   DTaP/Tdap/Td (3 - Td or Tdap) 12/18/2021   INFLUENZA VACCINE  12/20/2022        Bone Density status: Completed 09/19/21. Results reflect: Bone density results: OSTEOPENIA. Repeat every   years.    Additional Screening:  Hepatitis C Screening: does qualify; Completed 07/31/18  Vision Screening: Recommended annual ophthalmology exams for early detection of glaucoma and other disorders of the eye. Is the patient up to date with their annual eye exam?  Yes  Who is the provider or what is the name of the office in which the patient attends annual eye exams? Dr Cathey Endow If pt is not established with a provider, would they like to be referred to a provider to establish care? No .   Dental Screening: Recommended annual dental exams for proper oral hygiene    Community Resource Referral /  Chronic Care Management:  CRR required this visit?  No   CCM required this visit?  No     Plan:     I have personally reviewed and noted the following in the patient's chart:   Medical and social history Use of alcohol, tobacco or illicit drugs  Current medications and supplements including opioid prescriptions. Patient is not currently taking opioid prescriptions. Functional ability and status Nutritional status Physical activity Advanced directives List of other physicians Hospitalizations, surgeries, and ER visits in previous 12 months Vitals Screenings to include cognitive, depression, and falls Referrals and appointments  In addition, I have reviewed and discussed with patient certain preventive protocols, quality metrics, and best practice recommendations. A written personalized care plan for preventive services as well as general preventive health recommendations were provided to patient.     Tillie Rung, LPN   2/72/5366   After Visit Summary: (MyChart) Due to this being a telephonic visit, the after visit summary with patients personalized plan was offered to patient via MyChart   Nurse Notes: None

## 2023-02-14 NOTE — Patient Instructions (Addendum)
[name] , Thank you for taking time to come for your Medicare Wellness Visit. I appreciate your ongoing commitment to your health goals. Please review the following plan we discussed and let me know if I can assist you in the future.   Referrals/Orders/Follow-Ups/Clinician Recommendations:   This is a list of the screening recommended for you and due dates:  Health Maintenance  Topic Date Due   Zoster (Shingles) Vaccine (2 of 2) 09/04/2017   Pneumonia Vaccine (2 of 2 - PCV) 09/19/2021   DTaP/Tdap/Td vaccine (3 - Td or Tdap) 12/18/2021   Flu Shot  12/20/2022   Medicare Annual Wellness Visit  02/14/2024   DEXA scan (bone density measurement)  Completed   Hepatitis C Screening  Completed   HPV Vaccine  Aged Out   Colon Cancer Screening  Discontinued   COVID-19 Vaccine  Discontinued    Advanced directives: (Copy Requested) Please bring a copy of your health care power of attorney and living will to the office to be added to your chart at your convenience.  Next Medicare Annual Wellness Visit scheduled for next year: Yes

## 2023-02-27 ENCOUNTER — Ambulatory Visit (INDEPENDENT_AMBULATORY_CARE_PROVIDER_SITE_OTHER): Payer: Medicare PPO | Admitting: Internal Medicine

## 2023-02-27 ENCOUNTER — Encounter: Payer: Self-pay | Admitting: Internal Medicine

## 2023-02-27 VITALS — BP 136/84 | HR 65 | Temp 98.0°F | Ht 64.0 in | Wt 140.0 lb

## 2023-02-27 DIAGNOSIS — Z23 Encounter for immunization: Secondary | ICD-10-CM

## 2023-02-27 DIAGNOSIS — E876 Hypokalemia: Secondary | ICD-10-CM | POA: Diagnosis not present

## 2023-02-27 DIAGNOSIS — E785 Hyperlipidemia, unspecified: Secondary | ICD-10-CM

## 2023-02-27 DIAGNOSIS — I10 Essential (primary) hypertension: Secondary | ICD-10-CM | POA: Diagnosis not present

## 2023-02-27 LAB — URINALYSIS, ROUTINE W REFLEX MICROSCOPIC
Bilirubin Urine: NEGATIVE
Hgb urine dipstick: NEGATIVE
Ketones, ur: NEGATIVE
Leukocytes,Ua: NEGATIVE
Nitrite: NEGATIVE
Specific Gravity, Urine: 1.02 (ref 1.000–1.030)
Total Protein, Urine: NEGATIVE
Urine Glucose: NEGATIVE
Urobilinogen, UA: 0.2 (ref 0.0–1.0)
pH: 6.5 (ref 5.0–8.0)

## 2023-02-27 LAB — BASIC METABOLIC PANEL
BUN: 18 mg/dL (ref 6–23)
CO2: 31 meq/L (ref 19–32)
Calcium: 10 mg/dL (ref 8.4–10.5)
Chloride: 104 meq/L (ref 96–112)
Creatinine, Ser: 0.89 mg/dL (ref 0.40–1.20)
GFR: 62.75 mL/min (ref >60.00)
Glucose, Bld: 95 mg/dL (ref 70–99)
Potassium: 3.9 meq/L (ref 3.5–5.1)
Sodium: 142 meq/L (ref 135–145)

## 2023-02-27 LAB — TSH: TSH: 1.37 u[IU]/mL (ref 0.35–5.50)

## 2023-02-27 LAB — LIPID PANEL
Cholesterol: 195 mg/dL (ref 0–200)
HDL: 64 mg/dL (ref >39.00)
LDL Cholesterol: 114 mg/dL — ABNORMAL HIGH (ref 0–99)
NonHDL: 131.42
Total CHOL/HDL Ratio: 3
Triglycerides: 87 mg/dL (ref 0.0–149.0)
VLDL: 17.4 mg/dL (ref 0.0–40.0)

## 2023-02-27 LAB — HEPATIC FUNCTION PANEL
ALT: 18 U/L (ref 0–35)
AST: 25 U/L (ref 0–37)
Albumin: 4 g/dL (ref 3.5–5.2)
Alkaline Phosphatase: 72 U/L (ref 39–117)
Bilirubin, Direct: 0.1 mg/dL (ref 0.0–0.3)
Total Bilirubin: 0.9 mg/dL (ref 0.2–1.2)
Total Protein: 6.9 g/dL (ref 6.0–8.3)

## 2023-02-27 MED ORDER — CARVEDILOL 3.125 MG PO TABS
3.1250 mg | ORAL_TABLET | Freq: Two times a day (BID) | ORAL | 1 refills | Status: DC
Start: 2023-02-27 — End: 2023-08-13

## 2023-02-27 MED ORDER — PRAVASTATIN SODIUM 40 MG PO TABS: 40.0000 mg | ORAL_TABLET | Freq: Every day | ORAL | 1 refills | Status: AC

## 2023-02-27 MED ORDER — SPIRONOLACTONE 25 MG PO TABS
25.0000 mg | ORAL_TABLET | Freq: Every day | ORAL | 1 refills | Status: DC
Start: 2023-02-27 — End: 2023-08-13

## 2023-02-27 NOTE — Patient Instructions (Signed)
Hypertension, Adult High blood pressure (hypertension) is when the force of blood pumping through the arteries is too strong. The arteries are the blood vessels that carry blood from the heart throughout the body. Hypertension forces the heart to work harder to pump blood and may cause arteries to become narrow or stiff. Untreated or uncontrolled hypertension can lead to a heart attack, heart failure, a stroke, kidney disease, and other problems. A blood pressure reading consists of a higher number over a lower number. Ideally, your blood pressure should be below 120/80. The first ("top") number is called the systolic pressure. It is a measure of the pressure in your arteries as your heart beats. The second ("bottom") number is called the diastolic pressure. It is a measure of the pressure in your arteries as the heart relaxes. What are the causes? The exact cause of this condition is not known. There are some conditions that result in high blood pressure. What increases the risk? Certain factors may make you more likely to develop high blood pressure. Some of these risk factors are under your control, including: Smoking. Not getting enough exercise or physical activity. Being overweight. Having too much fat, sugar, calories, or salt (sodium) in your diet. Drinking too much alcohol. Other risk factors include: Having a personal history of heart disease, diabetes, high cholesterol, or kidney disease. Stress. Having a family history of high blood pressure and high cholesterol. Having obstructive sleep apnea. Age. The risk increases with age. What are the signs or symptoms? High blood pressure may not cause symptoms. Very high blood pressure (hypertensive crisis) may cause: Headache. Fast or irregular heartbeats (palpitations). Shortness of breath. Nosebleed. Nausea and vomiting. Vision changes. Severe chest pain, dizziness, and seizures. How is this diagnosed? This condition is diagnosed by  measuring your blood pressure while you are seated, with your arm resting on a flat surface, your legs uncrossed, and your feet flat on the floor. The cuff of the blood pressure monitor will be placed directly against the skin of your upper arm at the level of your heart. Blood pressure should be measured at least twice using the same arm. Certain conditions can cause a difference in blood pressure between your right and left arms. If you have a high blood pressure reading during one visit or you have normal blood pressure with other risk factors, you may be asked to: Return on a different day to have your blood pressure checked again. Monitor your blood pressure at home for 1 week or longer. If you are diagnosed with hypertension, you may have other blood or imaging tests to help your health care provider understand your overall risk for other conditions. How is this treated? This condition is treated by making healthy lifestyle changes, such as eating healthy foods, exercising more, and reducing your alcohol intake. You may be referred for counseling on a healthy diet and physical activity. Your health care provider may prescribe medicine if lifestyle changes are not enough to get your blood pressure under control and if: Your systolic blood pressure is above 130. Your diastolic blood pressure is above 80. Your personal target blood pressure may vary depending on your medical conditions, your age, and other factors. Follow these instructions at home: Eating and drinking  Eat a diet that is high in fiber and potassium, and low in sodium, added sugar, and fat. An example of this eating plan is called the DASH diet. DASH stands for Dietary Approaches to Stop Hypertension. To eat this way: Eat   plenty of fresh fruits and vegetables. Try to fill one half of your plate at each meal with fruits and vegetables. Eat whole grains, such as whole-wheat pasta, brown rice, or whole-grain bread. Fill about one  fourth of your plate with whole grains. Eat or drink low-fat dairy products, such as skim milk or low-fat yogurt. Avoid fatty cuts of meat, processed or cured meats, and poultry with skin. Fill about one fourth of your plate with lean proteins, such as fish, chicken without skin, beans, eggs, or tofu. Avoid pre-made and processed foods. These tend to be higher in sodium, added sugar, and fat. Reduce your daily sodium intake. Many people with hypertension should eat less than 1,500 mg of sodium a day. Do not drink alcohol if: Your health care provider tells you not to drink. You are pregnant, may be pregnant, or are planning to become pregnant. If you drink alcohol: Limit how much you have to: 0-1 drink a day for women. 0-2 drinks a day for men. Know how much alcohol is in your drink. In the [place] one drink equals one 12 oz bottle of beer (355 mL), one 5 oz glass of wine (148 mL), or one 1 oz glass of hard liquor (44 mL). Lifestyle  Work with your health care provider to maintain a healthy body weight or to lose weight. Ask what an ideal weight is for you. Get at least 30 minutes of exercise that causes your heart to beat faster (aerobic exercise) most days of the week. Activities may include walking, swimming, or biking. Include exercise to strengthen your muscles (resistance exercise), such as Pilates or lifting weights, as part of your weekly exercise routine. Try to do these types of exercises for 30 minutes at least 3 days a week. Do not use any products that contain nicotine or tobacco. These products include cigarettes, chewing tobacco, and vaping devices, such as e-cigarettes. If you need help quitting, ask your health care provider. Monitor your blood pressure at home as told by your health care provider. Keep all follow-up visits. This is important. Medicines Take over-the-counter and prescription medicines only as told by your health care provider. Follow directions carefully. Blood  pressure medicines must be taken as prescribed. Do not skip doses of blood pressure medicine. Doing this puts you at risk for problems and can make the medicine less effective. Ask your health care provider about side effects or reactions to medicines that you should watch for. Contact a health care provider if you: Think you are having a reaction to a medicine you are taking. Have headaches that keep coming back (recurring). Feel dizzy. Have swelling in your ankles. Have trouble with your vision. Get help right away if you: Develop a severe headache or confusion. Have unusual weakness or numbness. Feel faint. Have severe pain in your chest or abdomen. Vomit repeatedly. Have trouble breathing. These symptoms may be an emergency. Get help right away. Call 911. Do not wait to see if the symptoms will go away. Do not drive yourself to the hospital. Summary Hypertension is when the force of blood pumping through your arteries is too strong. If this condition is not controlled, it may put you at risk for serious complications. Your personal target blood pressure may vary depending on your medical conditions, your age, and other factors. For most people, a normal blood pressure is less than 120/80. Hypertension is treated with lifestyle changes, medicines, or a combination of both. Lifestyle changes include losing weight, eating a healthy,   low-sodium diet, exercising more, and limiting alcohol. This information is not intended to replace advice given to you by your health care provider. Make sure you discuss any questions you have with your health care provider. Document Revised: 03/14/2021 Document Reviewed: 03/14/2021 Elsevier Patient Education  2024 [employer]  

## 2023-02-27 NOTE — Progress Notes (Signed)
Subjective:  Patient ID: [name], female    DOB: 1945-08-11  Age: 77 y.o. MRN: [id number]  CC: Hypertension   HPI [name] presents for f/up ---  Discussed the use of AI scribe software for clinical note transcription with the patient, who gave verbal consent to proceed.  History of Present Illness   The patient, who has been maintaining an active lifestyle and engaging in social activities, reports feeling well with no complaints of fatigue, chest pain, or shortness of breath. They have noticed a reduction in ankle swelling and have made dietary changes, specifically eliminating salt from their diet.  The patient also mentions a variation in urine color, alternating between yellow and almost white. However, they deny any associated discomfort, blood in the urine, or increased frequency of urination. There is also no reported pain in the pelvis or abdomen. Despite the absence of discomfort, the patient expresses a desire to provide a urine specimen for further investigation.       Outpatient Medications Prior to Visit  Medication Sig Dispense Refill   acetaminophen (TYLENOL) 325 MG tablet Take 2 tablets (650 mg total) by mouth every 6 (six) hours as needed for mild pain (or temp > 100).     donepezil (ARICEPT) 10 MG tablet Take one tablet at 10 mg daily 90 tablet 1   eszopiclone (LUNESTA) 2 MG TABS tablet Take 1 tablet (2 mg total) by mouth at bedtime as needed for sleep. Take immediately before bedtime 90 tablet 1   levocetirizine (XYZAL) 5 MG tablet Take 1 tablet (5 mg total) by mouth every evening. 90 tablet 1   MYRBETRIQ 50 MG TB24 tablet TAKE 1 TABLET BY MOUTH DAILY 90 tablet 1   Omega-3 Fatty Acids (FISH OIL) 1000 MG CAPS Take by mouth. Reported on 12/05/2015     triamcinolone cream (KENALOG) 0.5 % Apply 1 application. topically 2 (two) times daily. 60 g 2   carvedilol (COREG) 3.125 MG tablet Take 1 tablet (3.125 mg total) by mouth 2 (two) times daily  with a meal. 180 tablet 1   pravastatin (PRAVACHOL) 40 MG tablet Take 1 tablet (40 mg total) by mouth daily. 90 tablet 1   spironolactone (ALDACTONE) 25 MG tablet Take 1 tablet (25 mg total) by mouth daily. 90 tablet 1   No facility-administered medications prior to visit.    ROS Review of Systems  Constitutional:  Negative for appetite change, chills, diaphoresis and fatigue.  HENT: Negative.    Eyes: Negative.   Respiratory: Negative.  Negative for cough, chest tightness and shortness of breath.   Cardiovascular:  Negative for chest pain, palpitations and leg swelling.  Gastrointestinal:  Negative for abdominal pain, constipation, diarrhea, nausea and vomiting.  Genitourinary: Negative.  Negative for difficulty urinating, dysuria, hematuria and menstrual problem.  Musculoskeletal:  Positive for arthralgias. Negative for myalgias.  Skin: Negative.   Neurological:  Negative for dizziness, weakness, light-headedness and headaches.  Hematological:  Negative for adenopathy. Does not bruise/bleed easily.  Psychiatric/Behavioral:  Positive for confusion, decreased concentration and sleep disturbance.     Objective:  BP 136/84 (BP Location: Right Arm, Patient Position: Sitting, Cuff Size: Large)   Pulse 65   Temp 98 F (36.7 C) (Oral)   Ht 5\' 4"  (1.626 m)   Wt 140 lb (63.5 kg)   SpO2 97%   BMI 24.03 kg/m   BP Readings from Last 3 Encounters:  02/27/23 136/84  10/17/22 130/86  09/26/22 (!) 142/84    [employer]  Readings from Last 3 Encounters:  02/27/23 140 lb (63.5 kg)  02/14/23 141 lb (64 kg)  10/17/22 141 lb (64 kg)    Physical Exam Vitals reviewed.  Constitutional:      Appearance: Normal appearance.  HENT:     Nose: Nose normal.     Mouth/Throat:     Mouth: Mucous membranes are moist.  Eyes:     General: No scleral icterus.    Conjunctiva/sclera: Conjunctivae normal.  Cardiovascular:     Rate and Rhythm: Regular rhythm. Bradycardia present.     Heart sounds: No murmur  heard.    No friction rub. No gallop.     Comments: EKG ---- SB, 56 bpm ?LAE No LVH, Q waves, or ST/T waves  Pulmonary:     Breath sounds: No stridor. No wheezing, rhonchi or rales.  Abdominal:     General: Abdomen is flat.     Palpations: There is no mass.     Tenderness: There is no abdominal tenderness. There is no guarding.     Hernia: No hernia is present.  Musculoskeletal:        General: Normal range of motion.     Cervical back: Neck supple.     Right lower leg: Edema (trace pitting) present.     Left lower leg: Edema (trace pitting) present.  Lymphadenopathy:     Cervical: No cervical adenopathy.  Skin:    General: Skin is warm and dry.  Neurological:     Mental Status: She is alert. Mental status is at baseline.  Psychiatric:        Mood and Affect: Mood normal.        Behavior: Behavior normal.     Lab Results  Component Value Date   WBC 3.7 (L) 09/26/2022   HGB 12.9 09/26/2022   HCT 38.7 09/26/2022   PLT 172.0 09/26/2022   GLUCOSE 95 02/27/2023   CHOL 195 02/27/2023   TRIG 87.0 02/27/2023   HDL 64.00 02/27/2023   LDLCALC 114 (H) 02/27/2023   ALT 18 02/27/2023   AST 25 02/27/2023   NA 142 02/27/2023   K 3.9 02/27/2023   CL 104 02/27/2023   CREATININE 0.89 02/27/2023   BUN 18 02/27/2023   CO2 31 02/27/2023   TSH 1.37 02/27/2023   INR 1.6 (H) 11/01/2006   HGBA1C 5.9 08/13/2019    MM 3D SCREEN BREAST BILATERAL  Result Date: 04/04/2022 CLINICAL DATA:  Screening. EXAM: DIGITAL SCREENING BILATERAL MAMMOGRAM WITH TOMOSYNTHESIS AND CAD TECHNIQUE: Bilateral screening digital craniocaudal and mediolateral oblique mammograms were obtained. Bilateral screening digital breast tomosynthesis was performed. The images were evaluated with computer-aided detection. COMPARISON:  Previous exam(s). ACR Breast Density Category b: There are scattered areas of fibroglandular density. FINDINGS: There are no findings suspicious for malignancy. IMPRESSION: No mammographic  evidence of malignancy. A result letter of this screening mammogram will be mailed directly to the patient. RECOMMENDATION: Screening mammogram in one year. (Code:SM-B-01Y) BI-RADS CATEGORY  1: Negative. Electronically Signed   By: Gerome Sam III M.D.   On: 04/04/2022 15:49    Assessment & Plan:   Essential hypertension, benign- EKG is negative for LVH. BP is well controlled. -     TSH; Future -     Urinalysis, Routine w reflex microscopic; Future -     Hepatic function panel; Future -     Basic metabolic panel; Future -     EKG 12-Lead -     Carvedilol; Take 1 tablet (3.125 mg total)  by mouth 2 (two) times daily with a meal.  Dispense: 180 tablet; Refill: 1 -     Spironolactone; Take 1 tablet (25 mg total) by mouth daily.  Dispense: 90 tablet; Refill: 1  Hyperlipidemia with target LDL less than 130 - LDL goal achieved. Doing well on the statin  -     Lipid panel; Future -     TSH; Future -     Hepatic function panel; Future -     Pravastatin Sodium; Take 1 tablet (40 mg total) by mouth daily.  Dispense: 90 tablet; Refill: 1  Flu vaccine need -     Flu Vaccine Trivalent High Dose (Fluad)  Need for vaccination -     Pneumococcal conjugate vaccine 20-valent  Hypokalemia -     Spironolactone; Take 1 tablet (25 mg total) by mouth daily.  Dispense: 90 tablet; Refill: 1     Follow-up: Return in about 6 months (around 08/28/2023).  Sanda Linger, MD

## 2023-03-07 ENCOUNTER — Encounter: Payer: Self-pay | Admitting: Psychology

## 2023-03-08 ENCOUNTER — Encounter: Payer: Self-pay | Admitting: Psychology

## 2023-03-08 ENCOUNTER — Ambulatory Visit: Payer: Medicare PPO

## 2023-03-08 ENCOUNTER — Ambulatory Visit (INDEPENDENT_AMBULATORY_CARE_PROVIDER_SITE_OTHER): Payer: Medicare PPO | Admitting: Psychology

## 2023-03-08 DIAGNOSIS — F0280 Dementia in other diseases classified elsewhere without behavioral disturbance: Secondary | ICD-10-CM

## 2023-03-08 DIAGNOSIS — G309 Alzheimer's disease, unspecified: Secondary | ICD-10-CM | POA: Diagnosis not present

## 2023-03-08 DIAGNOSIS — R4189 Other symptoms and signs involving cognitive functions and awareness: Secondary | ICD-10-CM

## 2023-03-08 HISTORY — DX: Dementia in other diseases classified elsewhere, unspecified severity, without behavioral disturbance, psychotic disturbance, mood disturbance, and anxiety: F02.80

## 2023-03-08 NOTE — Progress Notes (Signed)
   Psychometrician Note   Cognitive testing was administered to [name] by Shan Levans, B.S. (psychometrist) under the supervision of Dr. Newman Nickels, Ph.D., licensed psychologist on 03/08/2023. [name] did not appear overtly distressed by the testing session per behavioral observation or responses across self-report questionnaires. Rest breaks were offered.    The battery of tests administered was selected by Dr. Newman Nickels, Ph.D. with consideration to [name]'s current level of functioning, the nature of her symptoms, emotional and behavioral responses during interview, level of literacy, observed level of motivation/effort, and the nature of the referral question. This battery was communicated to the psychometrist. Communication between Dr. Newman Nickels, Ph.D. and the psychometrist was ongoing throughout the evaluation and Dr. Newman Nickels, Ph.D. was immediately accessible at all times. Dr. Newman Nickels, Ph.D. provided supervision to the psychometrist on the date of this service to the extent necessary to assure the quality of all services provided.    [name] will return within approximately 1-2 weeks for an interactive feedback session with Dr. Milbert Coulter at which time her test performances, clinical impressions, and treatment recommendations will be reviewed in detail. [name] understands she can contact our office should she require our assistance before this time.  A total of 125 minutes of billable time were spent face-to-face with [name] by the psychometrist. This includes both test administration and scoring time. Billing for these services is reflected in the clinical report generated by Dr. Newman Nickels, Ph.D.  This note reflects time spent with the psychometrician and does not include test scores or any clinical interpretations made by Dr. Milbert Coulter. The full report will follow in a separate note.

## 2023-03-08 NOTE — Progress Notes (Unsigned)
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 drawing task. Performance across recognition tasks was below expectation overall, suggesting very limited evidence for information consolidation.   Results of emotional screening instruments suggested that recent symptoms of generalized anxiety were in the minimal range, while symptoms of depression were within normal limits. A screening instrument assessing recent sleep quality suggested the presence of minimal sleep dysfunction.  Tables of Scores:   Note: This summary of test scores accompanies the interpretive report and should not be considered in isolation without reference to the appropriate sections in the text. Descriptors are based on appropriate normative data and may be adjusted based on clinical judgment. Terms such as "Within Normal Limits" and "Outside Normal Limits" are used when a more specific description of the test score cannot be determined. Descriptors refer to the current evaluation only.         Percentile - Normative Descriptor > 98 - Exceptionally High 91-97 - Well Above Average 75-90 - Above Average 25-74 - Average 9-24 - Below Average 2-8 - Well Below Average < 2 - Exceptionally Low        Validity: September 2022 Current   DESCRIPTOR        DCT: --- --- --- Within Normal Limits  RBANS EI: --- --- --- Outside Normal Limits  WAIS-IV RDS: --- --- --- Within Normal Limits        Orientation:       Raw Score Raw Score Percentile   NAB Orientation, Form 1 26/29 24/29 --- ---        Cognitive Screening:       Raw Score Raw Score Percentile   SLUMS: 12/30 13/30 --- ---        RBANS, Form A: Standard Score/ Scaled Score Standard Score/ Scaled Score Percentile   Total Score 60 49 <1 Exceptionally Low  Immediate Memory 69 44 <1 Exceptionally Low    List Learning 4 1 <1 Exceptionally Low    Story Memory 5 2 <1 Exceptionally Low  Visuospatial/Constructional 89 75 5 Well Below Average    Figure Copy 12 5 5  Well Below Average    Line Orientation 11/20 13/20 10-16 Below Average  Language 60 51 <1 Exceptionally Low    Picture Naming 6/10 5/10 <2 Exceptionally Low    Semantic Fluency 5 2 <1 Exceptionally Low  Attention 82 64 1 Exceptionally Low    Digit Span 8 8 25  Average    Coding 6 1 <1 Exceptionally Low  Delayed Memory 40 40 <1 Exceptionally Low    List Recall 0/10 0/10 <2 Exceptionally Low    List Recognition 10/20 8/20 <2 Exceptionally Low    Story Recall 1 1 <1 Exceptionally Low    Figure Recall 2 1 <1 Exceptionally Low         Intellectual Functioning:       Standard Score Standard Score Percentile   Test of Premorbid Functioning: 77 80 9 Below Average        Attention/Executive Function:  Trail Making Test (TMT): Raw Score (T Score) Raw Score (T Score) Percentile     Part A 59 secs.,  1 error (39) 74 secs.,  0 errors (33) 5 Well Below Average    Part B Discontinued Discontinued --- Impaired          Scaled Score Scaled Score Percentile   WAIS-IV Digit Span: 8 7 16  Below Average    Forward 8 8 25  Average    Backward 9 9 37 Average    Sequencing 9 5 5  Well Below Average         Scaled Score Scaled Score Percentile   WAIS-IV Similarities: 7 1 <1 Exceptionally Low        D-KEFS  Color-Word Interference Test: Raw Score (Scaled Score) Raw Score (Scaled Score) Percentile     Color Naming 29 secs. (12) 34 secs. (10) 50 Average    Word Reading 24 secs. (11) 26 secs. (10) 50 Average    Inhibition Discontinued Discontinued --- Impaired    Inhibition/Switching Discontinued Discontinued --- Impaired        D-KEFS Verbal Fluency Test: Raw Score (Scaled Score) Raw Score (Scaled Score) Percentile     Letter Total Correct 28 (8) 19 (5) 5 Well Below Average    Category Total Correct 22 (6) 15 (3) 1 Exceptionally Low    Category Switching Total Correct 8 (5) 8 (5) 5 Well Below Average    Category Switching Accuracy 7 (6) 5 (4) 2 Well Below Average      Total Set Loss Errors 6 (5) 12 (1) <1 Exceptionally Low      Total Repetition Errors 2 (11) 3 (10) 50 Average        NAB Executive Functions Module, Form 1: T Score T Score Percentile     Judgment 44 35 7 Well Below Average        Language:      Verbal Fluency Test: Raw Score (T Score) Raw Score (T Score) Percentile     Phonemic Fluency (FAS) 28 (42) 19 (36) 8 Well Below Average    Animal Fluency 9 (35) 7 (29) 2 Exceptionally Low         NAB Language Module, Form 1: T Score T Score Percentile     Auditory Comprehension 36 26 1 Exceptionally Low    Naming 19/31 (19) 17/31 (19) <1 Exceptionally Low        Visuospatial/Visuoconstruction:       Raw Score Raw Score Percentile   Clock Drawing: 9/10 9/10 --- Within Normal Limits         Scaled Score Scaled Score Percentile   WAIS-IV Block Design: --- 5 5 Well Below Average        Mood and Personality:       Raw Score Raw Score Percentile   Geriatric Depression Scale: 4 5 --- Within Normal Limits  Geriatric Anxiety Scale: 6 11 --- Minimal    Somatic 5 4 --- Minimal    Cognitive 1 6 --- Mild    Affective 0 1 --- Minimal        Additional Questionnaires:       Raw Score Raw Score Percentile   PROMIS Sleep Disturbance Questionnaire: 24 18 --- None to Slight   Informed  Consent and Coding/Compliance:   The current evaluation represents a clinical evaluation for the purposes previously outlined by the referral source and is in no way reflective of a forensic evaluation.   [name]. Bladow was provided with a  verbal description of the nature and purpose of the present neuropsychological evaluation. Also reviewed were the foreseeable risks and/or discomforts and benefits of the procedure, limits of confidentiality, and mandatory reporting requirements of this provider. The patient was given the opportunity to ask questions and receive answers about the evaluation. Oral consent to participate was provided by the patient.   This evaluation was conducted by Newman Nickels, Ph.D., ABPP-CN, board certified clinical neuropsychologist. [name]. Janee Morn completed a clinical interview with Dr. Milbert Coulter, billed as one unit [phone], and 125 minutes of cognitive testing and scoring, billed as one unit [phone] and three additional units [id number]. Psychometrist Shan Levans, B.S. assisted Dr. Milbert Coulter with test administration and scoring procedures. As a separate and discrete service, one unit [id number] and two units [phone] were billed for Dr. Tammy Sours time spent in interpretation and report writing.

## 2023-03-11 ENCOUNTER — Encounter: Payer: Self-pay | Admitting: Psychology

## 2023-03-12 DIAGNOSIS — H401131 Primary open-angle glaucoma, bilateral, mild stage: Secondary | ICD-10-CM | POA: Diagnosis not present

## 2023-03-15 ENCOUNTER — Ambulatory Visit (INDEPENDENT_AMBULATORY_CARE_PROVIDER_SITE_OTHER): Payer: Medicare PPO | Admitting: Psychology

## 2023-03-15 DIAGNOSIS — G309 Alzheimer's disease, unspecified: Secondary | ICD-10-CM | POA: Diagnosis not present

## 2023-03-15 DIAGNOSIS — F0280 Dementia in other diseases classified elsewhere without behavioral disturbance: Secondary | ICD-10-CM

## 2023-03-15 NOTE — Progress Notes (Signed)
   Neuropsychology Feedback Session [name]. [facility]  Department of Neurology  Reason for Referral:   [name] is a 77 y.o. right-handed African-American female referred by Marlowe Kays, PA-C, to characterize her current cognitive functioning and assist with diagnostic clarity and treatment planning in the context of mild cognitive impairment, concerns for underlying Alzheimer's disease, and concerns for progressive decline.   Feedback:   [name] completed a comprehensive neuropsychological evaluation on 03/08/2023. Please refer to that encounter for the full report and recommendations. Briefly, results suggested severe impairment surrounding all aspects of learning and memory. Additional severe impairments were exhibited across executive functioning, safety/judgment, receptive language, verbal fluency (semantic slightly worse than phonemic), and confrontation naming. Performance variability was exhibited across processing speed, complex attention, and visuospatial abilities. Relative to her previous evaluation in September 2022, subtle to mild decline was exhibited across essentially all assessed cognitive domains. This was most noteworthy across abstract reasoning and visuoconstructional abilities. Severe memory impairment with amnestic performances was exhibited across both evaluations. Regarding the cause of her mild dementia presentation, I continue to have primary concerns surrounding Alzheimer's disease. Across memory testing, [name] did not benefit from repeated exposure to new information, was fully amnestic (i.e., 0% retention) across all memory tasks after a brief delay, and performed poorly across yes/no recognition trials. Taken together, this suggests evidence for rapid forgetting and a pronounced storage impairment, which represents the hallmark testing pattern for this illness. Further severe impairments surrounding semantic fluency and  confrontation naming follow expected disease progression, as well as evidence for subtle to mild decline over the past two years.    [name] was unaccompanied during the current feedback session. Content of the current session focused on the results of her neuropsychological evaluation. [name] was given the opportunity to ask questions and her questions were answered. She was encouraged to reach out should additional questions arise. A copy of her report was provided at the conclusion of the visit.      One unit [phone] was billed for Dr. Tammy Sours time spent preparing for, conducting, and documenting the current feedback session with [name].

## 2023-04-01 ENCOUNTER — Ambulatory Visit: Payer: Medicare PPO | Admitting: Internal Medicine

## 2023-04-09 ENCOUNTER — Ambulatory Visit: Payer: Medicare PPO | Admitting: Physician Assistant

## 2023-05-20 DIAGNOSIS — N3091 Cystitis, unspecified with hematuria: Secondary | ICD-10-CM | POA: Diagnosis not present

## 2023-06-14 ENCOUNTER — Encounter: Payer: Self-pay | Admitting: Family Medicine

## 2023-06-14 ENCOUNTER — Ambulatory Visit (INDEPENDENT_AMBULATORY_CARE_PROVIDER_SITE_OTHER): Payer: Medicare PPO

## 2023-06-14 ENCOUNTER — Ambulatory Visit (INDEPENDENT_AMBULATORY_CARE_PROVIDER_SITE_OTHER): Payer: Medicare PPO | Admitting: Family Medicine

## 2023-06-14 VITALS — BP 134/88 | HR 102 | Temp 97.8°F | Ht 64.0 in

## 2023-06-14 DIAGNOSIS — M47817 Spondylosis without myelopathy or radiculopathy, lumbosacral region: Secondary | ICD-10-CM | POA: Diagnosis not present

## 2023-06-14 DIAGNOSIS — M47816 Spondylosis without myelopathy or radiculopathy, lumbar region: Secondary | ICD-10-CM | POA: Diagnosis not present

## 2023-06-14 DIAGNOSIS — M25552 Pain in left hip: Secondary | ICD-10-CM

## 2023-06-14 DIAGNOSIS — M5442 Lumbago with sciatica, left side: Secondary | ICD-10-CM

## 2023-06-14 DIAGNOSIS — Z96642 Presence of left artificial hip joint: Secondary | ICD-10-CM | POA: Diagnosis not present

## 2023-06-14 MED ORDER — KETOROLAC TROMETHAMINE 60 MG/2ML IM SOLN
60.0000 mg | Freq: Once | INTRAMUSCULAR | Status: AC
Start: 2023-06-14 — End: 2023-06-14
  Administered 2023-06-14: 60 mg via INTRAMUSCULAR

## 2023-06-14 NOTE — Progress Notes (Signed)
Subjective:     Patient ID: [name], female    DOB: [birth year], 78 y.o.   MRN: [id number]  Chief Complaint  Patient presents with   Hip Pain    Left hip pain started a month ago, pain radiates down to knee and left foot    Hip Pain  Pertinent negatives include no tingling.     History of Present Illness         C/o a 1 month hx of left low back and left hip pain. Denies injury. Denies falling.   Anterior, lateral and posterior hip pain. No numbness, tingling or weakness.    Health Maintenance Due  Topic Date Due   Zoster Vaccines- Shingrix (2 of 2) 09/04/2017   DTaP/Tdap/Td (3 - Td or Tdap) 12/18/2021    Past Medical History:  Diagnosis Date   Chronic bilateral low back pain without sciatica 12/26/2020   Deficiency anemia 06/29/2020   Essential hypertension, benign 07/11/2009   Estrogen deficiency 03/27/2021   Hyperlipidemia with target LDL less than 130 06/29/2020   The 10-year ASCVD risk score Denman George DC Jr., et al., 2013) is: 16.3%   Values used to calculate the score:     Age: 13 years     Sex: Female     Is Non-Hispanic African American: Yes     Diabetic: No     Tobacco smoker: No     Systolic Blood Pressure: 148 mmHg     Is BP treated: Yes     HDL Cholesterol: 53.6 mg/dL     Total Cholesterol: 179 mg/dL   Hypokalemia 40/98/1191   Intrinsic eczema 09/25/2021   LAE (left atrial enlargement) 06/29/2020   Mild dementia likely due to Alzheimer's disease 03/08/2023   OAB (overactive bladder) 12/26/2020   Polyp of colon 09/19/2020   Primary osteoarthritis of left shoulder 01/17/2021   Primary osteoarthritis of right shoulder 01/17/2021   Psychophysiological insomnia 09/26/2022   Stage 3a chronic kidney disease 09/26/2022   Estimated Creatinine Clearance: 48.1 mL/min (by C-G formula based on SCr of 0.86 mg/dL).       Past Surgical History:  Procedure Laterality Date   LAPAROSCOPIC LYSIS OF ADHESIONS  09/08/2017   LAPAROSCOPY N/A 09/08/2017    Procedure: LYSIS OF ADHESIONS LAPAROSCOPIC;  Surgeon: Sheliah Hatch, De Blanch, MD;  Location: [facility] OR;  Service: General;  Laterality: N/A;   REPLACEMENT TOTAL KNEE Left     Family History  Problem Relation Age of Onset   Cancer Mother        STOMACH   Heart disease Father    Diabetes Sister    Diabetes Brother    Breast cancer Neg Hx     Social History   Socioeconomic History   Marital status: Divorced    Spouse name: Not on file   Number of children: Not on file   Years of education: 16   Highest education level: Bachelor's degree (e.g., BA, AB, BS)  Occupational History   Occupation: Retired    Comment: Psychologist, forensic  Tobacco Use   Smoking status: Never   Smokeless tobacco: Never  Vaping Use   Vaping status: Never Used  Substance and Sexual Activity   Alcohol use: No    Alcohol/week: 0.0 standard drinks of alcohol    Comment: occasional   Drug use: No   Sexual activity: Not Currently    Birth control/protection: None  Other Topics Concern   Not on file  Social History Narrative   Right handed  Lives alone    2 story home   Drinks caffeine prn   Social Drivers of Health   Financial Resource Strain: Low Risk  (02/14/2023)   Overall Financial Resource Strain (CARDIA)    Difficulty of Paying Living Expenses: Not hard at all  Food Insecurity: No Food Insecurity (02/14/2023)   Hunger Vital Sign    Worried About Running Out of Food in the Last Year: Never true    Ran Out of Food in the Last Year: Never true  Transportation Needs: No Transportation Needs (02/14/2023)   PRAPARE - Administrator, Civil Service (Medical): No    Lack of Transportation (Non-Medical): No  Physical Activity: Sufficiently Active (02/14/2023)   Exercise Vital Sign    Days of Exercise per Week: 5 days    Minutes of Exercise per Session: 50 min  Stress: No Stress Concern Present (02/14/2023)   [employer] of Occupational Health - Occupational Stress Questionnaire    Feeling  of Stress : Not at all  Social Connections: Moderately Integrated (02/14/2023)   Social Connection and Isolation Panel [NHANES]    Frequency of Communication with Friends and Family: More than three times a week    Frequency of Social Gatherings with Friends and Family: More than three times a week    Attends Religious Services: More than 4 times per year    Active Member of [employer] or Organizations: Yes    Attends Engineer, structural: More than 4 times per year    Marital Status: Divorced  Intimate Partner Violence: Not At Risk (02/14/2023)   Humiliation, Afraid, Rape, and Kick questionnaire    Fear of Current or Ex-Partner: No    Emotionally Abused: No    Physically Abused: No    Sexually Abused: No    Outpatient Medications Prior to Visit  Medication Sig Dispense Refill   acetaminophen (TYLENOL) 325 MG tablet Take 2 tablets (650 mg total) by mouth every 6 (six) hours as needed for mild pain (or temp > 100).     carvedilol (COREG) 3.125 MG tablet Take 1 tablet (3.125 mg total) by mouth 2 (two) times daily with a meal. 180 tablet 1   donepezil (ARICEPT) 10 MG tablet Take one tablet at 10 mg daily 90 tablet 1   eszopiclone (LUNESTA) 2 MG TABS tablet Take 1 tablet (2 mg total) by mouth at bedtime as needed for sleep. Take immediately before bedtime 90 tablet 1   levocetirizine (XYZAL) 5 MG tablet Take 1 tablet (5 mg total) by mouth every evening. 90 tablet 1   MYRBETRIQ 50 MG TB24 tablet TAKE 1 TABLET BY MOUTH DAILY 90 tablet 1   Omega-3 Fatty Acids (FISH OIL) 1000 MG CAPS Take by mouth. Reported on 12/05/2015     pravastatin (PRAVACHOL) 40 MG tablet Take 1 tablet (40 mg total) by mouth daily. 90 tablet 1   spironolactone (ALDACTONE) 25 MG tablet Take 1 tablet (25 mg total) by mouth daily. 90 tablet 1   triamcinolone cream (KENALOG) 0.5 % Apply 1 application. topically 2 (two) times daily. 60 g 2   No facility-administered medications prior to visit.    No Known  Allergies  Review of Systems  Constitutional:  Negative for chills, fever and malaise/fatigue.  Respiratory:  Negative for shortness of breath.   Cardiovascular:  Negative for chest pain and palpitations.  Gastrointestinal:  Negative for abdominal pain, constipation, diarrhea, nausea and vomiting.  Genitourinary:  Negative for dysuria, frequency and urgency.  Musculoskeletal:  Positive for back pain and joint pain. Negative for falls.  Neurological:  Negative for dizziness, tingling, focal weakness and headaches.       Objective:    Physical Exam Constitutional:      General: She is not in acute distress.    Appearance: She is not ill-appearing.  Eyes:     Extraocular Movements: Extraocular movements intact.     Conjunctiva/sclera: Conjunctivae normal.  Cardiovascular:     Rate and Rhythm: Normal rate and regular rhythm.  Pulmonary:     Effort: Pulmonary effort is normal.     Breath sounds: Normal breath sounds.  Musculoskeletal:     Cervical back: Normal, normal range of motion and neck supple.     Thoracic back: Normal.     Lumbar back: Tenderness present. Decreased range of motion. Negative right straight leg raise test and negative left straight leg raise test.     Comments: TTP of left lumbar paraspinal muscle, left SI joint and left lateral hip pain. No instability or pain with hip rocking.   Skin:    General: Skin is warm and dry.     Findings: No bruising or rash.  Neurological:     General: No focal deficit present.     Mental Status: She is alert and oriented to person, place, and time.     Motor: No weakness.     Coordination: Coordination normal.     Gait: Gait abnormal.  Psychiatric:        Mood and Affect: Mood normal.        Behavior: Behavior normal.        Thought Content: Thought content normal.      BP 134/88 (BP Location: Left Arm, Patient Position: Sitting, Cuff Size: Large)   Pulse (!) 102   Temp 97.8 F (36.6 C) (Temporal)   Ht 5\' 4"  (1.626  m)   SpO2 98%   BMI 24.03 kg/m  Wt Readings from Last 3 Encounters:  02/27/23 140 lb (63.5 kg)  02/14/23 141 lb (64 kg)  10/17/22 141 lb (64 kg)       Assessment & Plan:   Problem List Items Addressed This Visit   None Visit Diagnoses       Left hip pain    -  Primary   Relevant Orders   DG HIP UNILAT W OR W/O PELVIS 2-3 VIEWS LEFT   DG Lumbar Spine 2-3 Views     Acute left-sided low back pain with left-sided sciatica       Relevant Medications   ketorolac (TORADOL) injection 60 mg (Completed)   Other Relevant Orders   DG HIP UNILAT W OR W/O PELVIS 2-3 VIEWS LEFT   DG Lumbar Spine 2-3 Views      No red flag symptoms.  Toradol 60 mg IM injection given in office.  Lumbar spine and left hip/pelvis X rays ordered.  Discussed conservative treatment with OTC pain medications, topical analgesics and heating pad.  Follow up if not improving in 2 weeks.   I am having Annmarie W. Reiger maintain her Fish Oil, acetaminophen, levocetirizine, triamcinolone cream, donepezil, Myrbetriq, eszopiclone, carvedilol, spironolactone, and pravastatin. We administered ketorolac.  Meds ordered this encounter  Medications   ketorolac (TORADOL) injection 60 mg

## 2023-06-14 NOTE — Patient Instructions (Signed)
Please go downstairs for X rays.   Take Tylenol 500 mg every 8 hours as needed for pain. Use a heating pad.   You can take ibuprofen 400 mg once or twice daily if needed.   You may also use over the counter topical medication such as Salon Pas or lidocaine patches.   We will be in touch with your X ray results.   Let us know if your pain is not improving in the next 2 weeks.

## 2023-06-17 NOTE — Progress Notes (Signed)
Her low back and hip X rays show advanced arthritis. Her hip replacement components look fine. I recommend following up with an orthopedist if she is not improving or we can send her to an orthopedist if she does not have one that she has seen in the past 3 years.

## 2023-06-18 ENCOUNTER — Ambulatory Visit: Payer: Medicare PPO | Admitting: Physician Assistant

## 2023-06-24 ENCOUNTER — Encounter: Payer: Self-pay | Admitting: Internal Medicine

## 2023-06-24 ENCOUNTER — Ambulatory Visit (INDEPENDENT_AMBULATORY_CARE_PROVIDER_SITE_OTHER): Payer: Medicare PPO | Admitting: Internal Medicine

## 2023-06-24 VITALS — BP 128/88 | HR 78 | Ht 64.0 in | Wt 137.0 lb

## 2023-06-24 DIAGNOSIS — Z0001 Encounter for general adult medical examination with abnormal findings: Secondary | ICD-10-CM | POA: Insufficient documentation

## 2023-06-24 DIAGNOSIS — Z23 Encounter for immunization: Secondary | ICD-10-CM | POA: Insufficient documentation

## 2023-06-24 DIAGNOSIS — Z0000 Encounter for general adult medical examination without abnormal findings: Secondary | ICD-10-CM | POA: Diagnosis not present

## 2023-06-24 DIAGNOSIS — R300 Dysuria: Secondary | ICD-10-CM

## 2023-06-24 DIAGNOSIS — I10 Essential (primary) hypertension: Secondary | ICD-10-CM

## 2023-06-24 LAB — CBC WITH DIFFERENTIAL/PLATELET
Basophils Absolute: 0 10*3/uL (ref 0.0–0.1)
Basophils Relative: 0.5 % (ref 0.0–3.0)
Eosinophils Absolute: 0.1 10*3/uL (ref 0.0–0.7)
Eosinophils Relative: 1.2 % (ref 0.0–5.0)
HCT: 37.1 % (ref 36.0–46.0)
Hemoglobin: 12.3 g/dL (ref 12.0–15.0)
Lymphocytes Relative: 35.9 % (ref 12.0–46.0)
Lymphs Abs: 1.9 10*3/uL (ref 0.7–4.0)
MCHC: 33.1 g/dL (ref 30.0–36.0)
MCV: 94.9 fL (ref 78.0–100.0)
Monocytes Absolute: 0.4 10*3/uL (ref 0.1–1.0)
Monocytes Relative: 7.9 % (ref 3.0–12.0)
Neutro Abs: 3 10*3/uL (ref 1.4–7.7)
Neutrophils Relative %: 54.5 % (ref 43.0–77.0)
Platelets: 282 10*3/uL (ref 150.0–400.0)
RBC: 3.91 Mil/uL (ref 3.87–5.11)
RDW: 14.4 % (ref 11.5–15.5)
WBC: 5.4 10*3/uL (ref 4.0–10.5)

## 2023-06-24 LAB — URINALYSIS, ROUTINE W REFLEX MICROSCOPIC
Bilirubin Urine: NEGATIVE
Hgb urine dipstick: NEGATIVE
Ketones, ur: NEGATIVE
Leukocytes,Ua: NEGATIVE
Nitrite: NEGATIVE
Specific Gravity, Urine: 1.015 (ref 1.000–1.030)
Total Protein, Urine: 30 — AB
Urine Glucose: NEGATIVE
Urobilinogen, UA: 0.2 (ref 0.0–1.0)
pH: 8 (ref 5.0–8.0)

## 2023-06-24 LAB — BASIC METABOLIC PANEL
BUN: 16 mg/dL (ref 6–23)
CO2: 27 meq/L (ref 19–32)
Calcium: 9.1 mg/dL (ref 8.4–10.5)
Chloride: 105 meq/L (ref 96–112)
Creatinine, Ser: 0.83 mg/dL (ref 0.40–1.20)
GFR: 68.08 mL/min (ref >60.00)
Glucose, Bld: 91 mg/dL (ref 70–99)
Potassium: 3.6 meq/L (ref 3.5–5.1)
Sodium: 142 meq/L (ref 135–145)

## 2023-06-24 MED ORDER — SHINGRIX 50 MCG/0.5ML IM SUSR: 0.5000 mL | Freq: Once | INTRAMUSCULAR | 1 refills | Status: AC

## 2023-06-24 MED ORDER — BOOSTRIX 5-2.5-18.5 LF-MCG/0.5 IM SUSP: 0.5000 mL | Freq: Once | INTRAMUSCULAR | 0 refills | Status: AC

## 2023-06-24 NOTE — Patient Instructions (Signed)

## 2023-06-24 NOTE — Progress Notes (Unsigned)
Subjective:  Patient ID: [name], female    DOB: Jan 30, 1946  Age: 78 y.o. MRN: [id number]  CC: Hypertension and Annual Exam   HPI [name] presents for a CPX and f/up ----  Discussed the use of AI scribe software for clinical note transcription with the patient, who gave verbal consent to proceed.  History of Present Illness   The patient presents with dark urine for two months.  Dark urine has been present for two months, with variations in color from yellow to clear. No associated pain during urination, nausea, vomiting, fever, chills, abdominal pain, or hematuria. No history of urinary tract infections, except possibly a distant one.  She experiences difficulty sleeping despite using Lunesta, which provides limited relief.       Outpatient Medications Prior to Visit  Medication Sig Dispense Refill   acetaminophen (TYLENOL) 325 MG tablet Take 2 tablets (650 mg total) by mouth every 6 (six) hours as needed for mild pain (or temp > 100).     carvedilol (COREG) 3.125 MG tablet Take 1 tablet (3.125 mg total) by mouth 2 (two) times daily with a meal. 180 tablet 1   donepezil (ARICEPT) 10 MG tablet Take one tablet at 10 mg daily 90 tablet 1   eszopiclone (LUNESTA) 2 MG TABS tablet Take 1 tablet (2 mg total) by mouth at bedtime as needed for sleep. Take immediately before bedtime 90 tablet 1   levocetirizine (XYZAL) 5 MG tablet Take 1 tablet (5 mg total) by mouth every evening. 90 tablet 1   MYRBETRIQ 50 MG TB24 tablet TAKE 1 TABLET BY MOUTH DAILY 90 tablet 1   Omega-3 Fatty Acids (FISH OIL) 1000 MG CAPS Take by mouth. Reported on 12/05/2015     pravastatin (PRAVACHOL) 40 MG tablet Take 1 tablet (40 mg total) by mouth daily. 90 tablet 1   spironolactone (ALDACTONE) 25 MG tablet Take 1 tablet (25 mg total) by mouth daily. 90 tablet 1   triamcinolone cream (KENALOG) 0.5 % Apply 1 application. topically 2 (two) times daily. 60 g 2   No facility-administered  medications prior to visit.    ROS Review of Systems  Constitutional: Negative.  Negative for appetite change, chills, diaphoresis and fatigue.  HENT: Negative.    Respiratory: Negative.  Negative for cough, chest tightness, shortness of breath and wheezing.   Cardiovascular:  Negative for chest pain, palpitations and leg swelling.  Gastrointestinal: Negative.  Negative for abdominal pain, constipation, diarrhea, nausea and vomiting.  Genitourinary:  Positive for dysuria. Negative for decreased urine volume, difficulty urinating, frequency, hematuria and urgency.  Musculoskeletal:  Positive for arthralgias and back pain. Negative for joint swelling and myalgias.  Neurological: Negative.  Negative for dizziness and weakness.  Hematological:  Negative for adenopathy. Does not bruise/bleed easily.  Psychiatric/Behavioral:  Positive for confusion and decreased concentration. Negative for behavioral problems, sleep disturbance and suicidal ideas. The patient is not nervous/anxious.     Objective:  BP 128/88 (BP Location: Left Arm, Patient Position: Sitting, Cuff Size: Normal)   Pulse 78   Ht 5\' 4"  (1.626 m)   Wt 137 lb (62.1 kg)   SpO2 98%   BMI 23.52 kg/m   BP Readings from Last 3 Encounters:  06/24/23 128/88  06/14/23 134/88  02/27/23 136/84    Wt Readings from Last 3 Encounters:  06/24/23 137 lb (62.1 kg)  02/27/23 140 lb (63.5 kg)  02/14/23 141 lb (64 kg)    Physical Exam Vitals reviewed.  Constitutional:  Appearance: Normal appearance.  HENT:     Mouth/Throat:     Mouth: Mucous membranes are moist.  Eyes:     General: No scleral icterus.    Conjunctiva/sclera: Conjunctivae normal.  Cardiovascular:     Rate and Rhythm: Normal rate and regular rhythm.     Heart sounds: No murmur heard. Pulmonary:     Effort: Pulmonary effort is normal.     Breath sounds: No stridor. No wheezing, rhonchi or rales.  Abdominal:     General: Abdomen is flat.     Palpations: There  is no mass.     Tenderness: There is no abdominal tenderness. There is no guarding.     Hernia: No hernia is present.  Musculoskeletal:        General: Normal range of motion.     Cervical back: Neck supple.     Right lower leg: No edema.     Left lower leg: No edema.  Lymphadenopathy:     Cervical: No cervical adenopathy.  Skin:    General: Skin is warm and dry.  Neurological:     General: No focal deficit present.     Mental Status: She is alert. Mental status is at baseline.  Psychiatric:        Mood and Affect: Mood normal.        Behavior: Behavior normal.     Lab Results  Component Value Date   WBC 3.7 (L) 09/26/2022   HGB 12.9 09/26/2022   HCT 38.7 09/26/2022   PLT 172.0 09/26/2022   GLUCOSE 95 02/27/2023   CHOL 195 02/27/2023   TRIG 87.0 02/27/2023   HDL 64.00 02/27/2023   LDLCALC 114 (H) 02/27/2023   ALT 18 02/27/2023   AST 25 02/27/2023   NA 142 02/27/2023   K 3.9 02/27/2023   CL 104 02/27/2023   CREATININE 0.89 02/27/2023   BUN 18 02/27/2023   CO2 31 02/27/2023   TSH 1.37 02/27/2023   INR 1.6 (H) 11/01/2006   HGBA1C 5.9 08/13/2019    MM 3D SCREEN BREAST BILATERAL Result Date: 04/04/2022 CLINICAL DATA:  Screening. EXAM: DIGITAL SCREENING BILATERAL MAMMOGRAM WITH TOMOSYNTHESIS AND CAD TECHNIQUE: Bilateral screening digital craniocaudal and mediolateral oblique mammograms were obtained. Bilateral screening digital breast tomosynthesis was performed. The images were evaluated with computer-aided detection. COMPARISON:  Previous exam(s). ACR Breast Density Category b: There are scattered areas of fibroglandular density. FINDINGS: There are no findings suspicious for malignancy. IMPRESSION: No mammographic evidence of malignancy. A result letter of this screening mammogram will be mailed directly to the patient. RECOMMENDATION: Screening mammogram in one year. (Code:SM-B-01Y) BI-RADS CATEGORY  1: Negative. Electronically Signed   By: Gerome Sam III M.D.   On:  04/04/2022 15:49    Assessment & Plan:  Dysuria -     Urinalysis, Routine w reflex microscopic; Future -     CULTURE, URINE COMPREHENSIVE; Future  Need for prophylactic vaccination with combined diphtheria-tetanus-pertussis (DTP) vaccine -     Boostrix; Inject 0.5 mLs into the muscle once for 1 dose.  Dispense: 0.5 mL; Refill: 0  Need for prophylactic vaccination and inoculation against varicella -     Shingrix; Inject 0.5 mLs into the muscle once for 1 dose.  Dispense: 0.5 mL; Refill: 1  Essential hypertension, benign -     Basic metabolic panel; Future -     CBC with Differential/Platelet; Future  Encounter for general adult medical examination with abnormal findings     Follow-up: Return in about 3  months (around 09/21/2023).  Sanda Linger, MD

## 2023-06-26 LAB — CULTURE, URINE COMPREHENSIVE

## 2023-06-27 ENCOUNTER — Encounter: Payer: Self-pay | Admitting: Internal Medicine

## 2023-08-13 ENCOUNTER — Other Ambulatory Visit: Payer: Self-pay

## 2023-08-13 ENCOUNTER — Telehealth: Payer: Self-pay | Admitting: Internal Medicine

## 2023-08-13 DIAGNOSIS — E876 Hypokalemia: Secondary | ICD-10-CM

## 2023-08-13 DIAGNOSIS — I10 Essential (primary) hypertension: Secondary | ICD-10-CM

## 2023-08-13 MED ORDER — CARVEDILOL 3.125 MG PO TABS: 3.1250 mg | ORAL_TABLET | Freq: Two times a day (BID) | ORAL | 0 refills | Status: AC

## 2023-08-13 MED ORDER — SPIRONOLACTONE 25 MG PO TABS: 25.0000 mg | ORAL_TABLET | Freq: Every day | ORAL | 0 refills | Status: AC

## 2023-08-13 NOTE — Telephone Encounter (Unsigned)
 Copied from CRM [id number]. Topic: Clinical - Medication Question >> Aug 13, 2023 12:00 PM Almira Coaster wrote: Reason for CRM: Patient is calling for a refill on her blood pressure medication; however, when I tried to confirm the name of medication patient was not able to provide it, she states she throws away the bottles and doesn't know the name. She will be out of medication on Saturday 08/17/2023 and needs a refill sent to [facility] [id number] [place], [place] - [address] Phone: [phone] Fax: [phone]

## 2023-08-13 NOTE — Telephone Encounter (Signed)
 Called an spoke with the patient and she was confused on the medication that she needed refill. I sent her both her medications to her local pharmacy.

## 2023-09-05 ENCOUNTER — Encounter (HOSPITAL_COMMUNITY): Payer: Self-pay

## 2023-09-05 ENCOUNTER — Emergency Department (HOSPITAL_COMMUNITY)
Admission: EM | Admit: 2023-09-05 | Discharge: 2023-09-05 | Disposition: A | Discharge status: Home / Self Care | Attending: Emergency Medicine | Admitting: Emergency Medicine

## 2023-09-05 ENCOUNTER — Telehealth: Payer: Self-pay | Admitting: Internal Medicine

## 2023-09-05 ENCOUNTER — Emergency Department (HOSPITAL_COMMUNITY)
Admission: EM | Admit: 2023-09-05 | Discharge: 2023-09-06 | Disposition: A | Discharge status: Home / Self Care | Source: Home / Self Care | Attending: Emergency Medicine | Admitting: Emergency Medicine

## 2023-09-05 ENCOUNTER — Ambulatory Visit: Payer: Self-pay

## 2023-09-05 ENCOUNTER — Emergency Department (HOSPITAL_COMMUNITY)

## 2023-09-05 ENCOUNTER — Other Ambulatory Visit: Payer: Self-pay

## 2023-09-05 ENCOUNTER — Ambulatory Visit (INDEPENDENT_AMBULATORY_CARE_PROVIDER_SITE_OTHER): Admitting: Nurse Practitioner

## 2023-09-05 VITALS — BP 128/90 | HR 78 | Temp 97.9°F | Ht 62.0 in | Wt 137.4 lb

## 2023-09-05 DIAGNOSIS — N939 Abnormal uterine and vaginal bleeding, unspecified: Secondary | ICD-10-CM

## 2023-09-05 DIAGNOSIS — N938 Other specified abnormal uterine and vaginal bleeding: Secondary | ICD-10-CM | POA: Diagnosis not present

## 2023-09-05 DIAGNOSIS — R319 Hematuria, unspecified: Secondary | ICD-10-CM | POA: Insufficient documentation

## 2023-09-05 DIAGNOSIS — R58 Hemorrhage, not elsewhere classified: Secondary | ICD-10-CM | POA: Diagnosis not present

## 2023-09-05 LAB — BASIC METABOLIC PANEL WITH GFR
Anion gap: 7 (ref 5–15)
BUN: 18 mg/dL (ref 8–23)
CO2: 27 mmol/L (ref 22–32)
Calcium: 9 mg/dL (ref 8.9–10.3)
Chloride: 107 mmol/L (ref 98–111)
Creatinine, Ser: 0.74 mg/dL (ref 0.44–1.00)
GFR, Estimated: >60 mL/min (ref >60)
Glucose, Bld: 101 mg/dL — ABNORMAL HIGH (ref 70–99)
Potassium: 4 mmol/L (ref 3.5–5.1)
Sodium: 141 mmol/L (ref 135–145)

## 2023-09-05 LAB — CBC WITH DIFFERENTIAL/PLATELET
Abs Immature Granulocytes: 0.01 10*3/uL (ref 0.00–0.07)
Basophils Absolute: 0 10*3/uL (ref 0.0–0.1)
Basophils Relative: 0 %
Eosinophils Absolute: 0.1 10*3/uL (ref 0.0–0.5)
Eosinophils Relative: 2 %
HCT: 36.4 % (ref 36.0–46.0)
Hemoglobin: 11.8 g/dL — ABNORMAL LOW (ref 12.0–15.0)
Immature Granulocytes: 0 %
Lymphocytes Relative: 41 %
Lymphs Abs: 1.7 10*3/uL (ref 0.7–4.0)
MCH: 30.4 pg (ref 26.0–34.0)
MCHC: 32.4 g/dL (ref 30.0–36.0)
MCV: 93.8 fL (ref 80.0–100.0)
Monocytes Absolute: 0.4 10*3/uL (ref 0.1–1.0)
Monocytes Relative: 9 %
Neutro Abs: 2 10*3/uL (ref 1.7–7.7)
Neutrophils Relative %: 48 %
Platelets: 191 10*3/uL (ref 150–400)
RBC: 3.88 MIL/uL (ref 3.87–5.11)
RDW: 14.9 % (ref 11.5–15.5)
WBC: 4.2 10*3/uL (ref 4.0–10.5)
nRBC: 0 % (ref 0.0–0.2)

## 2023-09-05 LAB — URINALYSIS, W/ REFLEX TO CULTURE (INFECTION SUSPECTED)
Bacteria, UA: NONE SEEN
Bilirubin Urine: NEGATIVE
Glucose, UA: NEGATIVE mg/dL
Ketones, ur: NEGATIVE mg/dL
Nitrite: NEGATIVE
Protein, ur: 30 mg/dL — AB
Specific Gravity, Urine: 1.02 (ref 1.005–1.030)
pH: 7 (ref 5.0–8.0)

## 2023-09-05 LAB — TYPE AND SCREEN
ABO/RH(D): A POS
Antibody Screen: NEGATIVE

## 2023-09-05 MED ORDER — MEGESTROL ACETATE 40 MG PO TABS: 80.0000 mg | ORAL_TABLET | Freq: Every day | ORAL | 0 refills | Status: AC

## 2023-09-05 NOTE — ED Notes (Signed)
 Pt brief changed, bright red blood in brief. Pt placed and gown, ambulatory to bathroom with standby assistance

## 2023-09-05 NOTE — ED Triage Notes (Signed)
 Patient stated she woke up and began having dark red blood in her urine. Denies burning urination. Denies blood thinners. Stated it looks like a period in her toilet. Denies abdominal pain.

## 2023-09-05 NOTE — Progress Notes (Signed)
 Established Patient Office Visit  Subjective   Patient ID: [name], female    DOB: 1945-08-14  Age: 78 y.o. MRN: [id number]  Chief Complaint  Patient presents with   Hematuria    78 year old patient with past medical history significant for chronic low back pain, anemia, hypertension, hyperlipidemia, eczema, Alzheimer's, overactive bladder, osteoarthritis, 3A CKD.  She arrives today with concerns of hematuria that started this morning.  She reports she woke up and identified brown discharge in her underwear.  She then had an episode of frank blood.  She thinks is from urinary tract but is not sure.  She actually went to the emergency department earlier this morning for evaluation.  At that time bleeding had subsided.  Urinalysis completed in emergency department and was generally unremarkable.  She was discharged and told to follow-up with primary care provider.  She arrives today, and during visit stood up and felt a gush of blood from her genital area.  She was placed on exam table and it was found to be actively bleeding from what looked to be a vaginal source.  She continued to actively bleed and we were unable to stop bleeding office today.      Review of Systems  Gastrointestinal:  Negative for abdominal pain, nausea and vomiting.       (-) pelvic pain  Genitourinary:  Positive for hematuria. Negative for dysuria and frequency.  Psychiatric/Behavioral:  Positive for substance abuse.       Objective:     BP (!) 128/90   Pulse 78   Temp 97.9 F (36.6 C) (Temporal)   Ht 5\' 2"  (1.575 m)   Wt 137 lb 6 oz (62.3 kg)   SpO2 96%   BMI 25.13 kg/m    Physical Exam Vitals reviewed. Exam conducted with a chaperone present.  Constitutional:      General: She is not in acute distress.    Appearance: Normal appearance.  HENT:     Head: Normocephalic and atraumatic.  Cardiovascular:     Rate and Rhythm: Normal rate and regular rhythm.     Pulses: Normal pulses.      Heart sounds: Normal heart sounds.  Pulmonary:     Effort: Pulmonary effort is normal.     Breath sounds: Normal breath sounds.  Genitourinary:    Comments: Active bleeding from what appears to be vaginal source. Possible ractal source. Blood was brown in color, thin, and bleeding was unable to be stopped in office.  Skin:    General: Skin is warm and dry.  Neurological:     General: No focal deficit present.     Mental Status: She is alert and oriented to person, place, and time.  Psychiatric:        Mood and Affect: Mood normal.        Behavior: Behavior normal.        Judgment: Judgment normal.      Results for orders placed or performed during the hospital encounter of 09/05/23  Urinalysis, w/ Reflex to Culture (Infection Suspected) -Urine, Clean Catch  Result Value Ref Range   Specimen Source URINE, CLEAN CATCH    Color, Urine YELLOW YELLOW   APPearance HAZY (A) CLEAR   Specific Gravity, Urine 1.020 1.005 - 1.030   pH 7.0 5.0 - 8.0   Glucose, UA NEGATIVE NEGATIVE mg/dL   Hgb urine dipstick SMALL (A) NEGATIVE   Bilirubin Urine NEGATIVE NEGATIVE   Ketones, ur NEGATIVE NEGATIVE mg/dL   Protein,  ur 30 (A) NEGATIVE mg/dL   Nitrite NEGATIVE NEGATIVE   Leukocytes,Ua TRACE (A) NEGATIVE   RBC / HPF 0-5 0 - 5 RBC/hpf   WBC, UA 0-5 0 - 5 WBC/hpf   Bacteria, UA NONE SEEN NONE SEEN   Squamous Epithelial / HPF 0-5 0 - 5 /HPF   Mucus PRESENT    Amorphous Crystal PRESENT       The 10-year ASCVD risk score (Arnett DK, et al., 2019) is: 20.2%    Assessment & Plan:   Problem List Items Addressed This Visit       Other   Vaginal bleeding - Primary   Acute Patient started actively bleeding which resulted in saturating depends that she was wearing within a few moments.  Bleeding unable to be controlled or stopped in the office during exam. Recommended safest thing would be for patient to return back to emergency department for evaluation. EMS called, patient transported  to [facility]. Patient requested we call her sister Hamilton Levin) to inform her.  Sister was called and was made aware.       Relevant Orders   Urine Culture   Microalbumin / creatinine urine ratio   CBC   Basic metabolic panel with GFR   Urinalysis, Routine w reflex microscopic    Return if symptoms worsen or fail to improve.    Zorita Hiss, NP

## 2023-09-05 NOTE — Discharge Instructions (Addendum)
 You were seen today for chief complaint of vaginal bleeding. I called to the gynecologist on-call.  You do have a small mass inside of your uterus.  They recommended starting on Megace to control the bleeding and have you follow-up in their clinic next week. Please call the attached phone number for Dr. Ona Bidding clinic.

## 2023-09-05 NOTE — Telephone Encounter (Signed)
 Spoke to pt daughter and explain what is going with pt and pt being transported by EMS to [facility], Also called the sister Ida, and allow up with what/ where pt will be going.

## 2023-09-05 NOTE — Telephone Encounter (Signed)
 Copied from CRM [id number]. Topic: Clinical - Medical Advice >> Sep 05, 2023  3:10 PM Allyne Areola wrote: Reason for CRM: Patient's daughter Marlis Simper is calling because mother is currently at the office and was told she needed to be admitted, she would like to know what's going on. Called [facility] and they advised that they were with patient at the moment and were unable to take the call. Please call daughter to advise, best contact number is [phone].

## 2023-09-05 NOTE — Assessment & Plan Note (Signed)
 Acute Patient started actively bleeding which resulted in saturating depends that she was wearing within a few moments.  Bleeding unable to be controlled or stopped in the office during exam. Recommended safest thing would be for patient to return back to emergency department for evaluation. EMS called, patient transported to [facility]. Patient requested we call her sister [name]) to inform her.  Sister was called and was made aware.

## 2023-09-05 NOTE — Telephone Encounter (Signed)
 Chief Complaint: Hematuria Symptoms: bright red blood in urine Frequency: Early AM today Pertinent Negatives: Patient denies fever, abd pain Disposition: [] ED /[] Urgent Care (no appt availability in office) / [x] Appointment(In office/virtual)/ []  [facility]/ [] Home Care/ [] Refused Recommended Disposition /[] [facility]/ []  Follow-up with PCP Additional Notes: Pt's daughter reports pt awoke early this AM with "bright red blood" in urine, denies fever, abd pain, back pain. Was seen in ED early this AM and UTI r/o. OV scheduled today. This RN educated pt on home care, new-worsening symptoms, when to call back/seek emergent care. Pt verbalized understanding and agrees to plan.    Copied from CRM [id number]. Topic: Clinical - Red Word Triage >> Sep 05, 2023  9:41 AM [place] A wrote: [facility] that prompted transfer to Nurse Triage: Patient's daughter called because patient went to ER this morning due to urinating blood. Er advised to seek Primary Reason for Disposition  Blood in urine  (Exception: Could be normal menstrual bleeding.)  Answer Assessment - Initial Assessment Questions 1. COLOR of URINE: "Describe the color of the urine."  (e.g., tea-colored, pink, red, bloody) "Do you have blood clots in your urine?" (e.g., none, pea, grape, small coin)     Bright red blood 2. ONSET: "When did the bleeding start?"      Early AM today  4. PAIN with URINATION: "Is there any pain with passing your urine?" If Yes, ask: "How bad is the pain?"  (Scale 1-10; or mild, moderate, severe)    - MILD: Complains slightly about urination hurting.    - MODERATE: Interferes with normal activities.      - SEVERE: Excruciating, unwilling or unable to urinate because of the pain.      None 5. FEVER: "Do you have a fever?" If Yes, ask: "What is your temperature, how was it measured, and when did it start?"     None 6. ASSOCIATED SYMPTOMS: "Are you passing urine more frequently than usual?"      Frequency 7. OTHER SYMPTOMS: "Do you have any other symptoms?" (e.g., back/flank pain, abdomen pain, vomiting)     None  Protocols used: Urine - Blood In-A-AH

## 2023-09-05 NOTE — ED Notes (Signed)
 Patient transported to [name]

## 2023-09-05 NOTE — ED Triage Notes (Signed)
 Pt was sent from PCP by EMS due to vaginal bleeding. Pt was seen here this AM for the same thing and was told to follow up with her PCP. PCP did vaginal exam and noted vaginal bleeding. Pt states when she stands up it feels like the blood is running out.

## 2023-09-05 NOTE — ED Provider Notes (Signed)
 [place] EMERGENCY DEPARTMENT AT [facility] Provider Note   CSN: [id number] Arrival date & time: 09/05/23  1529     History Chief Complaint  Patient presents with   Vaginal Bleeding    HPI [name] is a 78 y.o. female presenting for vaginal bleeding. Substantial amounts.  Going on for 3 days, worsening today.  Patient's recorded medical, surgical, social, medication list and allergies were reviewed in the Snapshot window as part of the initial history.   Review of Systems   Review of Systems  Constitutional:  Negative for chills and fever.  HENT:  Negative for ear pain and sore throat.   Eyes:  Negative for pain and visual disturbance.  Respiratory:  Negative for cough and shortness of breath.   Cardiovascular:  Negative for chest pain and palpitations.  Gastrointestinal:  Negative for abdominal pain and vomiting.  Genitourinary:  Positive for vaginal bleeding. Negative for dysuria and hematuria.  Musculoskeletal:  Negative for arthralgias and back pain.  Skin:  Negative for color change and rash.  Neurological:  Negative for seizures and syncope.  All other systems reviewed and are negative.   Physical Exam Updated Vital Signs BP (!) 139/93 (BP Location: Right Arm)   Pulse 76   Temp 98.1 F (36.7 C) (Oral)   Resp 14   Ht 5\' 2"  (1.575 m)   Wt 62.3 kg   SpO2 100%   BMI 25.12 kg/m  Physical Exam Vitals and nursing note reviewed.  Constitutional:      General: She is not in acute distress.    Appearance: She is well-developed.  HENT:     Head: Normocephalic and atraumatic.  Eyes:     Conjunctiva/sclera: Conjunctivae normal.  Cardiovascular:     Rate and Rhythm: Normal rate and regular rhythm.     Heart sounds: No murmur heard. Pulmonary:     Effort: Pulmonary effort is normal. No respiratory distress.     Breath sounds: Normal breath sounds.  Abdominal:     General: There is no distension.     Palpations: Abdomen is soft.      Tenderness: There is no abdominal tenderness. There is no right CVA tenderness or left CVA tenderness.  Musculoskeletal:        General: No swelling or tenderness. Normal range of motion.     Cervical back: Neck supple.  Skin:    General: Skin is warm and dry.  Neurological:     General: No focal deficit present.     Mental Status: She is alert and oriented to person, place, and time. Mental status is at baseline.     Cranial Nerves: No cranial nerve deficit.      ED Course/ Medical Decision Making/ A&P    Procedures Procedures   Medications Ordered in ED Medications - No data to display  Medical Decision Making:   78 year old female presenting with vaginal bleeding. Was referred to PCP today but sent back to the emergency department for vaginal bleeding. She is in no acute distress, only scant amount of blood in her clothing dark blood in nature.  It is subacute. Lab work shows no anemia, ultrasound shows no acute pathology.  I consulted gynecology given ongoing vaginal bleeding.  They stated patient could be started on Megace and safely followed up with them in the outpatient setting.  Patient grossly asymptomatic otherwise.  Stable for outpatient care and management without evidence of acute pathology at this time.  Disposition:  I have considered  need for hospitalization, however, considering all of the above, I believe this patient is stable for discharge at this time.  Patient/family educated about specific return precautions for given chief complaint and symptoms.  Patient/family educated about follow-up with PCP .     ollow up.   Patient/family expressed understanding of return precautions and need for follow-up. Patient spoken to regarding all imaging and laboratory results and appropriate follow up for these results. All education provided in verbal form with additional information in written form. Time was allowed for answering of patient questions. Patient discharged.     Emergency Department Medication Summary:   Medications - No data to display       Clinical Impression:  1. Vaginal bleeding   2. Dysfunctional uterine bleeding      Discharge   Final Clinical Impression(s) / ED Diagnoses Final diagnoses:  Vaginal bleeding  Dysfunctional uterine bleeding    Rx / DC Orders ED Discharge Orders          Ordered    megestrol (MEGACE) 40 MG tablet  Daily        09/05/23 2308              Onetha Bile, MD 09/05/23 2351

## 2023-09-05 NOTE — ED Provider Notes (Addendum)
 [place] EMERGENCY DEPARTMENT AT [facility] Provider Note   CSN: [id number] Arrival date & time: 09/05/23  1308     History  Chief Complaint  Patient presents with   Hematuria    [name] is a 78 y.o. female.  78 year old female presents with blood in her urine x 1 day.  Denies any dysuria.  No urinary urgency or frequency.  Denies any fever or flank..  Does not take any blood thinners.  No prior history of same.  Denies any bruising or body       Home Medications Prior to Admission medications   Medication Sig Start Date End Date Taking? Authorizing Provider  acetaminophen (TYLENOL) 325 MG tablet Take 2 tablets (650 mg total) by mouth every 6 (six) hours as needed for mild pain (or temp > 100). 09/12/17   Juliet Rude, PA-C  carvedilol (COREG) 3.125 MG tablet Take 1 tablet (3.125 mg total) by mouth 2 (two) times daily with a meal. 08/13/23   [name], MD  donepezil (ARICEPT) 10 MG tablet Take one tablet at 10 mg daily 06/14/22   Marcos Eke, PA-C  eszopiclone (LUNESTA) 2 MG TABS tablet Take 1 tablet (2 mg total) by mouth at bedtime as needed for sleep. Take immediately before bedtime 09/26/22   [name], MD  levocetirizine (XYZAL) 5 MG tablet Take 1 tablet (5 mg total) by mouth every evening. 09/25/21   [name], MD  MYRBETRIQ 50 MG TB24 tablet TAKE 1 TABLET BY MOUTH DAILY 08/08/22   [name], MD  Omega-3 Fatty Acids (FISH OIL) 1000 MG CAPS Take by mouth. Reported on 12/05/2015    [provider]  pravastatin (PRAVACHOL) 40 MG tablet Take 1 tablet (40 mg total) by mouth daily. 02/27/23   [name], MD  spironolactone (ALDACTONE) 25 MG tablet Take 1 tablet (25 mg total) by mouth daily. 08/13/23   [name], MD  triamcinolone cream (KENALOG) 0.5 % Apply 1 application. topically 2 (two) times daily. 09/25/21   [name], MD      Allergies    Patient has no known allergies.    Review of Systems    Review of Systems  All other systems reviewed and are negative.   Physical Exam Updated Vital Signs BP (!) 172/97   Pulse 80   Temp 97.8 F (36.6 C) (Oral)   Resp 16   Ht 1.575 m (5\' 2" )   Wt 68 kg   SpO2 100%   BMI 27.44 kg/m  Physical Exam Vitals and nursing note reviewed.  Constitutional:      General: She is not in acute distress.    Appearance: Normal appearance. She is well-developed. She is not toxic-appearing.  HENT:     Head: Normocephalic and atraumatic.  Eyes:     General: Lids are normal.     Conjunctiva/sclera: Conjunctivae normal.     Pupils: Pupils are equal, round, and reactive to light.  Neck:     Thyroid: No thyroid mass.     Trachea: No tracheal deviation.  Cardiovascular:     Rate and Rhythm: Normal rate and regular rhythm.     Heart sounds: Normal heart sounds. No murmur heard.    No gallop.  Pulmonary:     Effort: Pulmonary effort is normal. No respiratory distress.     Breath sounds: Normal breath sounds. No stridor. No decreased breath sounds, wheezing, rhonchi or rales.  Abdominal:  General: There is no distension.     Palpations: Abdomen is soft.     Tenderness: There is no abdominal tenderness. There is no rebound.  Musculoskeletal:        General: No tenderness. Normal range of motion.     Cervical back: Normal range of motion and neck supple.  Skin:    General: Skin is warm and dry.     Findings: No abrasion or rash.  Neurological:     Mental Status: She is alert and oriented to person, place, and time. Mental status is at baseline.     GCS: GCS eye subscore is 4. GCS verbal subscore is 5. GCS motor subscore is 6.     Cranial Nerves: No cranial nerve deficit.     Sensory: No sensory deficit.     Motor: Motor function is intact.  Psychiatric:        Attention and Perception: Attention normal.        Speech: Speech normal.        Behavior: Behavior normal.     ED Results / Procedures / Treatments   Labs (all labs ordered  are listed, but only abnormal results are displayed) Labs Reviewed  URINALYSIS, W/ REFLEX TO CULTURE (INFECTION SUSPECTED) - Abnormal; Notable for the following components:      Result Value   APPearance HAZY (*)    Hgb urine dipstick SMALL (*)    Protein, ur 30 (*)    Leukocytes,Ua TRACE (*)    All other components within normal limits    EKG None  Radiology No results found.  Procedures Procedures    Medications Ordered in ED Medications - No data to display  ED Course/ Medical Decision Making/ A&P                                 Medical Decision Making  Urinalysis negative for infection.  No hematuria noted.  Patient self checked for vaginal bleeding and states she has does not have any at this time.  Will follow-up with her physician       Final Clinical Impression(s) / ED Diagnoses Final diagnoses:  None    Rx / DC Orders ED Discharge Orders     None         Lind Repine, MD 09/05/23 6213    Lind Repine, MD 09/05/23 [phone]

## 2023-09-05 NOTE — ED Provider Triage Note (Addendum)
 Emergency Medicine Provider Triage Evaluation Note  Markeita Alicia , a 78 y.o. female  was evaluated in triage.  Pt complains of vaginal bleeding. Patient was seen in the ED earlier today and was discharged home. Patient followed up with her primary care and they noted vaginal bleeding on speculum exam and sent her back here. Patient denies vaginal bleeding at the time of my evaluation.  Review of Systems  Positive: Vaginal bleeding Negative: Abdominal pain  Physical Exam  BP (!) 143/92   Pulse 72   Temp 98.5 F (36.9 C) (Oral)   Resp 16   Ht 5\' 2"  (1.575 m)   Wt 62.3 kg   SpO2 100%   BMI 25.12 kg/m  Gen:   Awake, no distress   Resp:  Normal effort  MSK:   Moves extremities without difficulty Other:    Medical Decision Making  Medically screening exam initiated at 4:00 PM.  Appropriate orders placed.  Vickey Grammes was informed that the remainder of the evaluation will be completed by another provider, this initial triage assessment does not replace that evaluation, and the importance of remaining in the ED until their evaluation is complete.    Sonnie Dusky, PA-C 09/05/23 1605    Sonnie Dusky, PA-C 09/05/23 2038

## 2023-09-06 ENCOUNTER — Telehealth: Payer: Self-pay

## 2023-09-06 NOTE — Transitions of Care (Post Inpatient/ED Visit) (Signed)
 09/06/2023  Name: [name] MRN: [id number] DOB: [birth year]  Today's TOC FU Call Status: Today's TOC FU Call Status:: Successful TOC FU Call Completed TOC FU Call Complete Date: 09/06/23 Patient's Name and Date of Birth confirmed.  Transition Care Management Follow-up Telephone Call Date of Discharge: 09/05/23 [facility]charge Facility: [facility]) Type of Discharge: Emergency Department Reason for ED Visit: Other: (vaginal bleeding) How have you been since you were released from the hospital?: Better Any questions or concerns?: No  Items Reviewed: Did you receive and understand the discharge instructions provided?: Yes Medications obtained,verified, and reconciled?: Yes (Medications Reviewed) Any new allergies since your discharge?: No Dietary orders reviewed?: Yes Do you have support at home?: Yes People in Home [RPT]: child(ren), adult  Medications Reviewed Today: Medications Reviewed Today     Reviewed by Darrall Ellison, LPN (Licensed Practical Nurse) on 09/06/23 at 1102  Med List Status: <None>   Medication Order Taking? Sig Documenting Provider Last Dose Status Informant  acetaminophen  (TYLENOL ) 325 MG tablet [id number] No Take 2 tablets (650 mg total) by mouth every 6 (six) hours as needed for mild pain (or temp > 100). Annetta Killian, PA-C Taking Active Self  carvedilol  (COREG ) 3.125 MG tablet [id number] No Take 1 tablet (3.125 mg total) by mouth 2 (two) times daily with a meal. Arcadio Knuckles, MD Taking Active   donepezil  (ARICEPT ) 10 MG tablet [id number] No Take one tablet at 10 mg daily Wertman, Sara E, PA-C Taking Active   eszopiclone  (LUNESTA ) 2 MG TABS tablet [id number] No Take 1 tablet (2 mg total) by mouth at bedtime as needed for sleep. Take immediately before bedtime Arcadio Knuckles, MD Taking Active   levocetirizine (XYZAL ) 5 MG tablet [id number] No Take 1 tablet (5 mg total) by mouth every evening. Arcadio Knuckles, MD Taking Active   megestrol   (MEGACE ) 40 MG tablet [id number]  Take 2 tablets (80 mg total) by mouth daily. Onetha Bile, MD  Active   MYRBETRIQ  50 MG TB24 tablet [id number] No TAKE 1 TABLET BY MOUTH DAILY Arcadio Knuckles, MD Taking Active   Omega-3 Fatty Acids (FISH OIL) 1000 MG CAPS [id number] No Take by mouth. Reported on 12/05/2015 [provider] Taking Active   pravastatin  (PRAVACHOL ) 40 MG tablet [id number] No Take 1 tablet (40 mg total) by mouth daily. Arcadio Knuckles, MD Taking Active   spironolactone  (ALDACTONE ) 25 MG tablet [id number] No Take 1 tablet (25 mg total) by mouth daily. Arcadio Knuckles, MD Taking Active   triamcinolone  cream (KENALOG ) 0.5 % [id number] No Apply 1 application. topically 2 (two) times daily. Arcadio Knuckles, MD Taking Active             Home Care and Equipment/Supplies: Were Home Health Services Ordered?: NA Any new equipment or medical supplies ordered?: NA  Functional Questionnaire: Do you need assistance with bathing/showering or dressing?: No Do you need assistance with meal preparation?: No Do you need assistance with eating?: No Do you have difficulty maintaining continence: No Do you need assistance with getting out of bed/getting out of a chair/moving?: No Do you have difficulty managing or taking your medications?: No  Follow up appointments reviewed: PCP Follow-up appointment confirmed?: Yes Date of PCP follow-up appointment?: 09/18/23 Follow-up Provider: [facility] Follow-up appointment confirmed?: No Reason Specialist Follow-Up Not Confirmed: Patient has Specialist Provider Number and will Call for Appointment Do you need transportation to your follow-up appointment?: No Do you understand care options if your condition(s) worsen?:  Yes-patient verbalized understanding    SIGNATURE Darrall Ellison, LPN [facility] Nurse Health Advisor Direct Dial [phone]

## 2023-09-18 ENCOUNTER — Ambulatory Visit: Admitting: Internal Medicine

## 2023-09-18 ENCOUNTER — Encounter: Payer: Self-pay | Admitting: Nurse Practitioner

## 2023-09-19 DIAGNOSIS — N950 Postmenopausal bleeding: Secondary | ICD-10-CM | POA: Diagnosis not present

## 2023-09-20 DIAGNOSIS — N939 Abnormal uterine and vaginal bleeding, unspecified: Secondary | ICD-10-CM | POA: Diagnosis not present

## 2023-09-20 DIAGNOSIS — Z556 Problems related to health literacy: Secondary | ICD-10-CM | POA: Diagnosis not present

## 2023-09-20 DIAGNOSIS — I10 Essential (primary) hypertension: Secondary | ICD-10-CM | POA: Diagnosis not present

## 2023-09-20 DIAGNOSIS — N390 Urinary tract infection, site not specified: Secondary | ICD-10-CM | POA: Diagnosis not present

## 2023-10-03 DIAGNOSIS — N858 Other specified noninflammatory disorders of uterus: Secondary | ICD-10-CM | POA: Diagnosis not present

## 2023-10-03 DIAGNOSIS — N950 Postmenopausal bleeding: Secondary | ICD-10-CM | POA: Diagnosis not present

## 2023-10-10 DIAGNOSIS — N950 Postmenopausal bleeding: Secondary | ICD-10-CM | POA: Diagnosis not present

## 2023-10-17 DIAGNOSIS — R1909 Other intra-abdominal and pelvic swelling, mass and lump: Secondary | ICD-10-CM | POA: Diagnosis not present

## 2023-10-17 DIAGNOSIS — N950 Postmenopausal bleeding: Secondary | ICD-10-CM | POA: Diagnosis not present

## 2023-10-17 DIAGNOSIS — C7951 Secondary malignant neoplasm of bone: Secondary | ICD-10-CM | POA: Diagnosis not present

## 2023-10-17 DIAGNOSIS — I10 Essential (primary) hypertension: Secondary | ICD-10-CM | POA: Diagnosis not present

## 2023-10-17 DIAGNOSIS — Z01812 Encounter for preprocedural laboratory examination: Secondary | ICD-10-CM | POA: Diagnosis not present

## 2023-10-17 DIAGNOSIS — C541 Malignant neoplasm of endometrium: Secondary | ICD-10-CM | POA: Diagnosis not present

## 2023-10-18 ENCOUNTER — Telehealth: Payer: Self-pay | Admitting: Internal Medicine

## 2023-10-18 NOTE — Telephone Encounter (Signed)
 Copied from CRM [id number]. Topic: Clinical - Medical Advice >> Oct 18, 2023  9:26 AM Carlatta H wrote: Reason for CRM: Corbin Dess would like to know if Dr Rochelle Chu is willing to write a surgery clearance for the patient//Ph [phone] Option 3 for Robin//Please call Robin to advise

## 2023-10-18 NOTE — Telephone Encounter (Signed)
 Copied from CRM [id number]. Topic: Medical Record Request - Records Request >> Oct 18, 2023  9:30 AM Carlatta H wrote: Reason for CRM: [name] would also like last office visit notes faxed to her as well//She will be faxing all information now

## 2023-10-18 NOTE — Telephone Encounter (Signed)
 Unable to reach the patient. Unable to leave message to return call.

## 2023-10-18 NOTE — Telephone Encounter (Signed)
 Are you will to do this for this patient as seen just recently seen you for the issue at hand ?

## 2023-10-19 DIAGNOSIS — Z01818 Encounter for other preprocedural examination: Secondary | ICD-10-CM | POA: Diagnosis not present

## 2023-10-19 DIAGNOSIS — C541 Malignant neoplasm of endometrium: Secondary | ICD-10-CM | POA: Diagnosis not present

## 2023-10-21 DIAGNOSIS — C7951 Secondary malignant neoplasm of bone: Secondary | ICD-10-CM | POA: Diagnosis not present

## 2023-10-21 DIAGNOSIS — C541 Malignant neoplasm of endometrium: Secondary | ICD-10-CM | POA: Diagnosis not present

## 2023-10-22 NOTE — Telephone Encounter (Signed)
 Copied from CRM [id number]. Topic: Clinical - Medical Advice >> Oct 22, 2023  3:05 PM Chuck Crater wrote: Patient is currently in [place]  and that is where her surgery will be. Corbin Dess wants to know if Dr. Rochelle Chu can write up a medical clearance letter based on her most recent visit. Surgery is scheduled for 10/30/2023.

## 2023-10-23 NOTE — Telephone Encounter (Signed)
 Please advise. She didn't even see you at her last visit she seen [name]. She would need to be seen in office.

## 2023-10-23 NOTE — Telephone Encounter (Signed)
 Spoke with you about this clearance verbally and you gave the okay. Please type the clearance up and at attention to Dr. Claudine Cullens, MD. Once its typed printed and signed I will fax it for you. Her surgery is 10/30/2023 and they need this as soon as possible please.

## 2023-10-24 ENCOUNTER — Encounter: Payer: Self-pay | Admitting: Internal Medicine

## 2023-10-25 NOTE — Telephone Encounter (Signed)
This has been faxed successfully

## 2023-10-28 DIAGNOSIS — M461 Sacroiliitis, not elsewhere classified: Secondary | ICD-10-CM | POA: Diagnosis not present

## 2023-10-28 DIAGNOSIS — Z96642 Presence of left artificial hip joint: Secondary | ICD-10-CM | POA: Diagnosis not present

## 2023-10-28 DIAGNOSIS — M898X9 Other specified disorders of bone, unspecified site: Secondary | ICD-10-CM | POA: Diagnosis not present

## 2023-11-11 DIAGNOSIS — C7951 Secondary malignant neoplasm of bone: Secondary | ICD-10-CM | POA: Diagnosis not present

## 2023-11-11 DIAGNOSIS — F0390 Unspecified dementia without behavioral disturbance: Secondary | ICD-10-CM | POA: Diagnosis not present

## 2023-11-11 DIAGNOSIS — M899 Disorder of bone, unspecified: Secondary | ICD-10-CM | POA: Diagnosis not present

## 2023-11-11 DIAGNOSIS — C541 Malignant neoplasm of endometrium: Secondary | ICD-10-CM | POA: Diagnosis not present

## 2023-11-12 DIAGNOSIS — C541 Malignant neoplasm of endometrium: Secondary | ICD-10-CM | POA: Diagnosis not present

## 2023-11-18 DIAGNOSIS — R1909 Other intra-abdominal and pelvic swelling, mass and lump: Secondary | ICD-10-CM | POA: Diagnosis not present

## 2023-11-18 DIAGNOSIS — Z01812 Encounter for preprocedural laboratory examination: Secondary | ICD-10-CM | POA: Diagnosis not present

## 2023-11-18 DIAGNOSIS — C541 Malignant neoplasm of endometrium: Secondary | ICD-10-CM | POA: Diagnosis not present

## 2023-11-18 DIAGNOSIS — N950 Postmenopausal bleeding: Secondary | ICD-10-CM | POA: Diagnosis not present

## 2023-11-21 ENCOUNTER — Other Ambulatory Visit: Payer: Self-pay | Admitting: Physician Assistant

## 2023-11-21 DIAGNOSIS — C541 Malignant neoplasm of endometrium: Secondary | ICD-10-CM | POA: Diagnosis not present

## 2023-11-21 DIAGNOSIS — Z5111 Encounter for antineoplastic chemotherapy: Secondary | ICD-10-CM | POA: Diagnosis not present

## 2023-11-21 NOTE — Telephone Encounter (Signed)
 Left a message with the after hour service on  11-21-23   Caller needs a refill on the donepezil  sent to [facility]  in [place]   at [phone]

## 2023-11-25 DIAGNOSIS — C7989 Secondary malignant neoplasm of other specified sites: Secondary | ICD-10-CM | POA: Diagnosis not present

## 2023-11-25 DIAGNOSIS — C541 Malignant neoplasm of endometrium: Secondary | ICD-10-CM | POA: Diagnosis not present

## 2023-12-05 DIAGNOSIS — C541 Malignant neoplasm of endometrium: Secondary | ICD-10-CM | POA: Diagnosis not present

## 2023-12-06 NOTE — Telephone Encounter (Signed)
 Copied from CRM [id number]. Topic: Clinical - Medication Refill >> Dec 06, 2023  2:56 PM Jasmin G wrote: Medication: donepezil  (ARICEPT ) 10 MG tablet  Has the patient contacted their pharmacy? Yes (Agent: If no, request that the patient contact the pharmacy for the refill. If patient does not wish to contact the pharmacy document the reason why and proceed with request.) (Agent: If yes, when and what did the pharmacy advise?)  This is the patient's preferred pharmacy:  [facility] [id number] - [place], [place] - [address] [place] [address] Phone: [phone] Fax: [phone]  Is this the correct pharmacy for this prescription? Yes If no, delete pharmacy and type the correct one.   Has the prescription been filled recently? No  Is the patient out of the medication? Yes  Has the patient been seen for an appointment in the last year OR does the patient have an upcoming appointment? Yes  Can we respond through MyChart? Yes  Agent: Please be advised that Rx refills may take up to 3 business days. We ask that you follow-up with your pharmacy.

## 2023-12-09 ENCOUNTER — Other Ambulatory Visit: Payer: Self-pay | Admitting: Physician Assistant

## 2023-12-10 MED ORDER — DONEPEZIL HCL 10 MG PO TABS: ORAL_TABLET | ORAL | 0 refills | Status: AC

## 2023-12-11 ENCOUNTER — Other Ambulatory Visit: Payer: Self-pay | Admitting: Internal Medicine

## 2023-12-11 DIAGNOSIS — C541 Malignant neoplasm of endometrium: Secondary | ICD-10-CM | POA: Diagnosis not present

## 2023-12-11 DIAGNOSIS — I10 Essential (primary) hypertension: Secondary | ICD-10-CM

## 2023-12-11 DIAGNOSIS — E876 Hypokalemia: Secondary | ICD-10-CM

## 2023-12-12 DIAGNOSIS — C541 Malignant neoplasm of endometrium: Secondary | ICD-10-CM | POA: Diagnosis not present

## 2023-12-12 DIAGNOSIS — Z5111 Encounter for antineoplastic chemotherapy: Secondary | ICD-10-CM | POA: Diagnosis not present

## 2023-12-12 DIAGNOSIS — I10 Essential (primary) hypertension: Secondary | ICD-10-CM | POA: Diagnosis not present

## 2023-12-12 DIAGNOSIS — C7951 Secondary malignant neoplasm of bone: Secondary | ICD-10-CM | POA: Diagnosis not present

## 2023-12-20 ENCOUNTER — Other Ambulatory Visit: Payer: Self-pay | Admitting: Internal Medicine

## 2023-12-20 DIAGNOSIS — N3281 Overactive bladder: Secondary | ICD-10-CM

## 2023-12-24 NOTE — Telephone Encounter (Signed)
 Last OV 06/24/23 Next OV not scheduled  Last refill 08/08/22 Qty #90/1  This medication has not been refilled in over 1 year, forwarding to Dr. Joshua.

## 2023-12-25 DIAGNOSIS — C541 Malignant neoplasm of endometrium: Secondary | ICD-10-CM | POA: Diagnosis not present

## 2023-12-25 DIAGNOSIS — C7951 Secondary malignant neoplasm of bone: Secondary | ICD-10-CM | POA: Diagnosis not present

## 2024-01-02 DIAGNOSIS — Z5111 Encounter for antineoplastic chemotherapy: Secondary | ICD-10-CM | POA: Diagnosis not present

## 2024-01-02 DIAGNOSIS — C541 Malignant neoplasm of endometrium: Secondary | ICD-10-CM | POA: Diagnosis not present

## 2024-01-06 DIAGNOSIS — R441 Visual hallucinations: Secondary | ICD-10-CM | POA: Diagnosis not present

## 2024-01-06 DIAGNOSIS — G454 Transient global amnesia: Secondary | ICD-10-CM | POA: Diagnosis not present

## 2024-01-06 DIAGNOSIS — R4789 Other speech disturbances: Secondary | ICD-10-CM | POA: Diagnosis not present

## 2024-01-06 DIAGNOSIS — G4720 Circadian rhythm sleep disorder, unspecified type: Secondary | ICD-10-CM | POA: Diagnosis not present

## 2024-01-06 DIAGNOSIS — R479 Unspecified speech disturbances: Secondary | ICD-10-CM | POA: Diagnosis not present

## 2024-01-06 DIAGNOSIS — G3184 Mild cognitive impairment, so stated: Secondary | ICD-10-CM | POA: Diagnosis not present

## 2024-01-06 DIAGNOSIS — R55 Syncope and collapse: Secondary | ICD-10-CM | POA: Diagnosis not present

## 2024-01-06 DIAGNOSIS — R404 Transient alteration of awareness: Secondary | ICD-10-CM | POA: Diagnosis not present

## 2024-01-06 DIAGNOSIS — C541 Malignant neoplasm of endometrium: Secondary | ICD-10-CM | POA: Diagnosis not present

## 2024-01-14 DIAGNOSIS — C7951 Secondary malignant neoplasm of bone: Secondary | ICD-10-CM | POA: Diagnosis not present

## 2024-01-21 DIAGNOSIS — R4789 Other speech disturbances: Secondary | ICD-10-CM | POA: Diagnosis not present

## 2024-01-21 DIAGNOSIS — G4720 Circadian rhythm sleep disorder, unspecified type: Secondary | ICD-10-CM | POA: Diagnosis not present

## 2024-01-21 DIAGNOSIS — R479 Unspecified speech disturbances: Secondary | ICD-10-CM | POA: Diagnosis not present

## 2024-01-21 DIAGNOSIS — R55 Syncope and collapse: Secondary | ICD-10-CM | POA: Diagnosis not present

## 2024-01-21 DIAGNOSIS — R404 Transient alteration of awareness: Secondary | ICD-10-CM | POA: Diagnosis not present

## 2024-01-21 DIAGNOSIS — R441 Visual hallucinations: Secondary | ICD-10-CM | POA: Diagnosis not present

## 2024-01-21 DIAGNOSIS — G454 Transient global amnesia: Secondary | ICD-10-CM | POA: Diagnosis not present

## 2024-01-24 DIAGNOSIS — C541 Malignant neoplasm of endometrium: Secondary | ICD-10-CM | POA: Diagnosis not present

## 2024-01-27 DIAGNOSIS — C541 Malignant neoplasm of endometrium: Secondary | ICD-10-CM | POA: Diagnosis not present

## 2024-01-27 DIAGNOSIS — Z23 Encounter for immunization: Secondary | ICD-10-CM | POA: Diagnosis not present

## 2024-01-27 DIAGNOSIS — I10 Essential (primary) hypertension: Secondary | ICD-10-CM | POA: Diagnosis not present

## 2024-01-27 DIAGNOSIS — Z01818 Encounter for other preprocedural examination: Secondary | ICD-10-CM | POA: Diagnosis not present

## 2024-01-27 DIAGNOSIS — E785 Hyperlipidemia, unspecified: Secondary | ICD-10-CM | POA: Diagnosis not present

## 2024-01-27 DIAGNOSIS — R9431 Abnormal electrocardiogram [ECG] [EKG]: Secondary | ICD-10-CM | POA: Diagnosis not present

## 2024-01-29 DIAGNOSIS — R55 Syncope and collapse: Secondary | ICD-10-CM | POA: Diagnosis not present

## 2024-01-29 DIAGNOSIS — R404 Transient alteration of awareness: Secondary | ICD-10-CM | POA: Diagnosis not present

## 2024-01-29 DIAGNOSIS — G454 Transient global amnesia: Secondary | ICD-10-CM | POA: Diagnosis not present

## 2024-01-29 DIAGNOSIS — R479 Unspecified speech disturbances: Secondary | ICD-10-CM | POA: Diagnosis not present

## 2024-01-29 DIAGNOSIS — R4789 Other speech disturbances: Secondary | ICD-10-CM | POA: Diagnosis not present

## 2024-02-03 DIAGNOSIS — Z01818 Encounter for other preprocedural examination: Secondary | ICD-10-CM | POA: Diagnosis not present

## 2024-02-06 DIAGNOSIS — R441 Visual hallucinations: Secondary | ICD-10-CM | POA: Diagnosis not present

## 2024-02-06 DIAGNOSIS — R479 Unspecified speech disturbances: Secondary | ICD-10-CM | POA: Diagnosis not present

## 2024-02-06 DIAGNOSIS — G454 Transient global amnesia: Secondary | ICD-10-CM | POA: Diagnosis not present

## 2024-02-06 DIAGNOSIS — G3184 Mild cognitive impairment, so stated: Secondary | ICD-10-CM | POA: Diagnosis not present

## 2024-02-06 DIAGNOSIS — R404 Transient alteration of awareness: Secondary | ICD-10-CM | POA: Diagnosis not present

## 2024-02-06 DIAGNOSIS — R55 Syncope and collapse: Secondary | ICD-10-CM | POA: Diagnosis not present

## 2024-02-06 DIAGNOSIS — R4789 Other speech disturbances: Secondary | ICD-10-CM | POA: Diagnosis not present

## 2024-02-06 DIAGNOSIS — G4720 Circadian rhythm sleep disorder, unspecified type: Secondary | ICD-10-CM | POA: Diagnosis not present

## 2024-02-07 DIAGNOSIS — C541 Malignant neoplasm of endometrium: Secondary | ICD-10-CM | POA: Diagnosis not present

## 2024-02-07 DIAGNOSIS — R531 Weakness: Secondary | ICD-10-CM | POA: Diagnosis not present

## 2024-02-07 DIAGNOSIS — R55 Syncope and collapse: Secondary | ICD-10-CM | POA: Diagnosis not present

## 2024-02-07 DIAGNOSIS — N132 Hydronephrosis with renal and ureteral calculous obstruction: Secondary | ICD-10-CM | POA: Diagnosis not present

## 2024-02-07 DIAGNOSIS — N950 Postmenopausal bleeding: Secondary | ICD-10-CM | POA: Diagnosis not present

## 2024-02-07 DIAGNOSIS — D251 Intramural leiomyoma of uterus: Secondary | ICD-10-CM | POA: Diagnosis not present

## 2024-02-07 DIAGNOSIS — E785 Hyperlipidemia, unspecified: Secondary | ICD-10-CM | POA: Diagnosis not present

## 2024-02-07 DIAGNOSIS — N83292 Other ovarian cyst, left side: Secondary | ICD-10-CM | POA: Diagnosis not present

## 2024-02-07 DIAGNOSIS — N83291 Other ovarian cyst, right side: Secondary | ICD-10-CM | POA: Diagnosis not present

## 2024-02-07 DIAGNOSIS — N72 Inflammatory disease of cervix uteri: Secondary | ICD-10-CM | POA: Diagnosis not present

## 2024-02-07 DIAGNOSIS — F0390 Unspecified dementia without behavioral disturbance: Secondary | ICD-10-CM | POA: Diagnosis not present

## 2024-02-07 DIAGNOSIS — Z8542 Personal history of malignant neoplasm of other parts of uterus: Secondary | ICD-10-CM | POA: Diagnosis not present

## 2024-02-07 DIAGNOSIS — D259 Leiomyoma of uterus, unspecified: Secondary | ICD-10-CM | POA: Diagnosis not present

## 2024-02-07 DIAGNOSIS — Z90710 Acquired absence of both cervix and uterus: Secondary | ICD-10-CM | POA: Diagnosis not present

## 2024-02-07 DIAGNOSIS — R1084 Generalized abdominal pain: Secondary | ICD-10-CM | POA: Diagnosis not present

## 2024-02-07 DIAGNOSIS — Z743 Need for continuous supervision: Secondary | ICD-10-CM | POA: Diagnosis not present

## 2024-02-07 DIAGNOSIS — I10 Essential (primary) hypertension: Secondary | ICD-10-CM | POA: Diagnosis not present

## 2024-02-07 DIAGNOSIS — N9989 Other postprocedural complications and disorders of genitourinary system: Secondary | ICD-10-CM | POA: Diagnosis not present

## 2024-02-10 DIAGNOSIS — C7951 Secondary malignant neoplasm of bone: Secondary | ICD-10-CM | POA: Diagnosis not present

## 2024-02-12 DIAGNOSIS — D259 Leiomyoma of uterus, unspecified: Secondary | ICD-10-CM | POA: Diagnosis not present

## 2024-02-12 DIAGNOSIS — N72 Inflammatory disease of cervix uteri: Secondary | ICD-10-CM | POA: Diagnosis not present

## 2024-02-12 DIAGNOSIS — C541 Malignant neoplasm of endometrium: Secondary | ICD-10-CM | POA: Diagnosis not present

## 2024-02-14 DIAGNOSIS — C7951 Secondary malignant neoplasm of bone: Secondary | ICD-10-CM | POA: Diagnosis not present

## 2024-02-17 DIAGNOSIS — C7951 Secondary malignant neoplasm of bone: Secondary | ICD-10-CM | POA: Diagnosis not present

## 2024-02-18 ENCOUNTER — Ambulatory Visit: Payer: Medicare PPO

## 2024-02-18 DIAGNOSIS — C541 Malignant neoplasm of endometrium: Secondary | ICD-10-CM | POA: Diagnosis not present

## 2024-03-02 DIAGNOSIS — Z5111 Encounter for antineoplastic chemotherapy: Secondary | ICD-10-CM | POA: Diagnosis not present

## 2024-03-02 DIAGNOSIS — C541 Malignant neoplasm of endometrium: Secondary | ICD-10-CM | POA: Diagnosis not present

## 2024-03-06 ENCOUNTER — Other Ambulatory Visit: Payer: Self-pay | Admitting: Internal Medicine

## 2024-03-12 ENCOUNTER — Other Ambulatory Visit: Payer: Self-pay | Admitting: Internal Medicine

## 2024-03-12 DIAGNOSIS — I10 Essential (primary) hypertension: Secondary | ICD-10-CM

## 2024-03-12 DIAGNOSIS — E785 Hyperlipidemia, unspecified: Secondary | ICD-10-CM

## 2024-03-12 DIAGNOSIS — E876 Hypokalemia: Secondary | ICD-10-CM

## 2024-03-15 DIAGNOSIS — C7951 Secondary malignant neoplasm of bone: Secondary | ICD-10-CM | POA: Diagnosis not present

## 2024-03-25 ENCOUNTER — Other Ambulatory Visit: Payer: Self-pay | Admitting: Internal Medicine

## 2024-03-25 DIAGNOSIS — I10 Essential (primary) hypertension: Secondary | ICD-10-CM

## 2024-03-26 DIAGNOSIS — I10 Essential (primary) hypertension: Secondary | ICD-10-CM | POA: Diagnosis not present

## 2024-03-26 DIAGNOSIS — E559 Vitamin D deficiency, unspecified: Secondary | ICD-10-CM | POA: Diagnosis not present

## 2024-03-26 DIAGNOSIS — G309 Alzheimer's disease, unspecified: Secondary | ICD-10-CM | POA: Diagnosis not present

## 2024-03-26 DIAGNOSIS — Z59869 Financial insecurity, unspecified: Secondary | ICD-10-CM | POA: Diagnosis not present

## 2024-03-26 DIAGNOSIS — E785 Hyperlipidemia, unspecified: Secondary | ICD-10-CM | POA: Diagnosis not present

## 2024-03-26 DIAGNOSIS — M1990 Unspecified osteoarthritis, unspecified site: Secondary | ICD-10-CM | POA: Diagnosis not present

## 2024-03-26 DIAGNOSIS — C55 Malignant neoplasm of uterus, part unspecified: Secondary | ICD-10-CM | POA: Diagnosis not present

## 2024-03-26 DIAGNOSIS — Z8249 Family history of ischemic heart disease and other diseases of the circulatory system: Secondary | ICD-10-CM | POA: Diagnosis not present

## 2024-03-26 DIAGNOSIS — N3941 Urge incontinence: Secondary | ICD-10-CM | POA: Diagnosis not present

## 2024-03-30 DIAGNOSIS — Z5111 Encounter for antineoplastic chemotherapy: Secondary | ICD-10-CM | POA: Diagnosis not present

## 2024-03-30 DIAGNOSIS — C541 Malignant neoplasm of endometrium: Secondary | ICD-10-CM | POA: Diagnosis not present

## 2024-04-06 DIAGNOSIS — G4720 Circadian rhythm sleep disorder, unspecified type: Secondary | ICD-10-CM | POA: Diagnosis not present

## 2024-04-06 DIAGNOSIS — G3184 Mild cognitive impairment, so stated: Secondary | ICD-10-CM | POA: Diagnosis not present

## 2024-04-09 ENCOUNTER — Telehealth: Payer: Self-pay | Admitting: Internal Medicine

## 2024-04-09 DIAGNOSIS — E782 Mixed hyperlipidemia: Secondary | ICD-10-CM

## 2024-04-09 NOTE — Progress Notes (Signed)
 Pharmacy Quality Measure Review  This patient is appearing on a report for being at risk of failing the adherence measure for cholesterol (statin) medications this calendar year.   Medication: Pravastatin  40mg  Last fill date: 09/03 for 90 day supply  Spoke with daughter. Patient is currently located in and transitioning to [place] . Battling endometrial cancer at this time. Has sufficient supply of this medication, is out of the sleeping medication (likely Ezlopiclone) but was told by PCP office that she needs an appointment before this can be prescribed.  Will notify PCP that patient is likely moving. Encouraged patient to establish with new primary care in [place]  to have refills prior to her running out. All questions by the patient were answered.  Angela Baalmann, PharmD [facility] Pharmacist

## 2024-04-20 DIAGNOSIS — I10 Essential (primary) hypertension: Secondary | ICD-10-CM | POA: Diagnosis not present

## 2024-04-20 DIAGNOSIS — C7951 Secondary malignant neoplasm of bone: Secondary | ICD-10-CM | POA: Diagnosis not present

## 2024-04-20 DIAGNOSIS — C541 Malignant neoplasm of endometrium: Secondary | ICD-10-CM | POA: Diagnosis not present

## 2024-04-27 ENCOUNTER — Other Ambulatory Visit: Payer: Self-pay | Admitting: Internal Medicine

## 2024-05-15 ENCOUNTER — Telehealth: Payer: Self-pay | Admitting: Pharmacist

## 2024-05-15 NOTE — Progress Notes (Signed)
 Pharmacy Quality Measure Review  This patient is appearing on a report for being at risk of failing the adherence measure for cholesterol (statin) medications this calendar year.   Medication: Pravastatin  Last fill date: 03/28/24 for 30 day supply  No refills remaining. Has not seen PCP since 06/2023 - appears she may have transferred care to [place]  where daughter lives.  Darrelyn Drum, PharmD, BCPS, CPP Clinical Pharmacist Practitioner [facility] [phone]
